# Patient Record
Sex: Female | Born: 1935 | Race: White | Hispanic: No | Smoking: Never smoker
Health system: Southern US, Community
[De-identification: ages and names within clinical notes are randomized; demographics above are authoritative.]

## PROBLEM LIST (undated history)

## (undated) ENCOUNTER — Emergency Department (HOSPITAL_COMMUNITY): Payer: Medicare Other | Source: Home / Self Care

## (undated) DIAGNOSIS — N390 Urinary tract infection, site not specified: Secondary | ICD-10-CM

## (undated) DIAGNOSIS — F0390 Unspecified dementia without behavioral disturbance: Secondary | ICD-10-CM

## (undated) DIAGNOSIS — N200 Calculus of kidney: Secondary | ICD-10-CM

## (undated) DIAGNOSIS — I10 Essential (primary) hypertension: Secondary | ICD-10-CM

## (undated) DIAGNOSIS — C679 Malignant neoplasm of bladder, unspecified: Secondary | ICD-10-CM

## (undated) HISTORY — PX: ABDOMINAL HYSTERECTOMY: SHX81

## (undated) HISTORY — PX: CHOLECYSTECTOMY: SHX55

## (undated) HISTORY — PX: APPENDECTOMY: SHX54

## (undated) HISTORY — PX: TOTAL SHOULDER ARTHROPLASTY: SHX126

## (undated) HISTORY — PX: OTHER SURGICAL HISTORY: SHX169

## (undated) HISTORY — PX: REVISION UROSTOMY CUTANEOUS: SUR1282

---

## 2006-07-28 ENCOUNTER — Encounter: Admission: RE | Admit: 2006-07-28 | Discharge: 2006-07-28 | Payer: Self-pay | Admitting: Urology

## 2006-07-29 ENCOUNTER — Ambulatory Visit (HOSPITAL_BASED_OUTPATIENT_CLINIC_OR_DEPARTMENT_OTHER): Admission: RE | Admit: 2006-07-29 | Discharge: 2006-07-29 | Payer: Self-pay | Admitting: Urology

## 2006-07-29 ENCOUNTER — Encounter (INDEPENDENT_AMBULATORY_CARE_PROVIDER_SITE_OTHER): Payer: Self-pay | Admitting: Specialist

## 2006-08-09 ENCOUNTER — Ambulatory Visit (HOSPITAL_COMMUNITY): Admission: RE | Admit: 2006-08-09 | Discharge: 2006-08-09 | Payer: Self-pay | Admitting: Urology

## 2006-08-15 ENCOUNTER — Inpatient Hospital Stay (HOSPITAL_COMMUNITY): Admission: RE | Admit: 2006-08-15 | Discharge: 2006-08-23 | Payer: Self-pay | Admitting: Urology

## 2006-08-15 ENCOUNTER — Encounter (INDEPENDENT_AMBULATORY_CARE_PROVIDER_SITE_OTHER): Payer: Self-pay | Admitting: *Deleted

## 2006-09-10 ENCOUNTER — Emergency Department (HOSPITAL_COMMUNITY): Admission: EM | Admit: 2006-09-10 | Discharge: 2006-09-11 | Payer: Self-pay | Admitting: Emergency Medicine

## 2007-04-17 ENCOUNTER — Inpatient Hospital Stay (HOSPITAL_COMMUNITY): Admission: EM | Admit: 2007-04-17 | Discharge: 2007-04-21 | Payer: Self-pay | Admitting: Urology

## 2007-07-20 ENCOUNTER — Ambulatory Visit (HOSPITAL_COMMUNITY): Admission: RE | Admit: 2007-07-20 | Discharge: 2007-07-20 | Payer: Self-pay | Admitting: Urology

## 2007-07-21 ENCOUNTER — Ambulatory Visit (HOSPITAL_COMMUNITY): Admission: RE | Admit: 2007-07-21 | Discharge: 2007-07-21 | Payer: Self-pay | Admitting: Urology

## 2007-08-07 ENCOUNTER — Encounter (INDEPENDENT_AMBULATORY_CARE_PROVIDER_SITE_OTHER): Payer: Self-pay | Admitting: Interventional Radiology

## 2007-08-07 ENCOUNTER — Ambulatory Visit (HOSPITAL_COMMUNITY): Admission: RE | Admit: 2007-08-07 | Discharge: 2007-08-07 | Payer: Self-pay | Admitting: Orthopedic Surgery

## 2007-08-23 ENCOUNTER — Inpatient Hospital Stay (HOSPITAL_COMMUNITY): Admission: RE | Admit: 2007-08-23 | Discharge: 2007-08-25 | Payer: Self-pay | Admitting: Orthopedic Surgery

## 2007-08-23 ENCOUNTER — Encounter (INDEPENDENT_AMBULATORY_CARE_PROVIDER_SITE_OTHER): Payer: Self-pay | Admitting: Orthopedic Surgery

## 2007-09-02 ENCOUNTER — Inpatient Hospital Stay (HOSPITAL_COMMUNITY): Admission: EM | Admit: 2007-09-02 | Discharge: 2007-09-07 | Payer: Self-pay | Admitting: Emergency Medicine

## 2007-09-02 ENCOUNTER — Ambulatory Visit: Payer: Self-pay | Admitting: Internal Medicine

## 2007-09-06 ENCOUNTER — Encounter: Payer: Self-pay | Admitting: Internal Medicine

## 2007-09-08 ENCOUNTER — Ambulatory Visit: Payer: Self-pay | Admitting: Internal Medicine

## 2007-09-13 LAB — CBC WITH DIFFERENTIAL/PLATELET
BASO%: 0.4 % (ref 0.0–2.0)
Basophils Absolute: 0 10*3/uL (ref 0.0–0.1)
EOS%: 0.5 % (ref 0.0–7.0)
HGB: 10.6 g/dL — ABNORMAL LOW (ref 11.6–15.9)
MCH: 31.5 pg (ref 26.0–34.0)
MCHC: 34 g/dL (ref 32.0–36.0)
RBC: 3.37 10*6/uL — ABNORMAL LOW (ref 3.70–5.32)
RDW: 12.9 % (ref 11.3–14.5)
lymph#: 1.4 10*3/uL (ref 0.9–3.3)

## 2007-09-13 LAB — LACTATE DEHYDROGENASE: LDH: 109 U/L (ref 94–250)

## 2007-10-23 ENCOUNTER — Ambulatory Visit: Payer: Self-pay | Admitting: Internal Medicine

## 2007-10-25 LAB — CBC WITH DIFFERENTIAL/PLATELET
BASO%: 0.5 % (ref 0.0–2.0)
EOS%: 1.6 % (ref 0.0–7.0)
HCT: 34.6 % — ABNORMAL LOW (ref 34.8–46.6)
MCH: 31.1 pg (ref 26.0–34.0)
MCHC: 33.9 g/dL (ref 32.0–36.0)
MCV: 91.7 fL (ref 81.0–101.0)
MONO%: 11.6 % (ref 0.0–13.0)
NEUT%: 32.4 % — ABNORMAL LOW (ref 39.6–76.8)
lymph#: 2 10*3/uL (ref 0.9–3.3)

## 2007-12-04 ENCOUNTER — Ambulatory Visit (HOSPITAL_COMMUNITY): Admission: RE | Admit: 2007-12-04 | Discharge: 2007-12-04 | Payer: Self-pay | Admitting: Gastroenterology

## 2007-12-07 ENCOUNTER — Ambulatory Visit (HOSPITAL_COMMUNITY): Admission: RE | Admit: 2007-12-07 | Discharge: 2007-12-07 | Payer: Self-pay | Admitting: Gastroenterology

## 2010-02-27 ENCOUNTER — Emergency Department (HOSPITAL_COMMUNITY): Admission: EM | Admit: 2010-02-27 | Discharge: 2010-02-27 | Payer: Self-pay | Admitting: Emergency Medicine

## 2010-07-30 LAB — CBC
HCT: 35.6 % — ABNORMAL LOW (ref 36.0–46.0)
Hemoglobin: 12.3 g/dL (ref 12.0–15.0)
MCH: 32.2 pg (ref 26.0–34.0)
MCHC: 34.4 g/dL (ref 30.0–36.0)

## 2010-07-30 LAB — DIFFERENTIAL
Basophils Relative: 0 % (ref 0–1)
Eosinophils Absolute: 0 10*3/uL (ref 0.0–0.7)
Monocytes Absolute: 0.3 10*3/uL (ref 0.1–1.0)
Monocytes Relative: 6 % (ref 3–12)
Neutrophils Relative %: 78 % — ABNORMAL HIGH (ref 43–77)

## 2010-07-30 LAB — BASIC METABOLIC PANEL
BUN: 13 mg/dL (ref 6–23)
CO2: 25 mEq/L (ref 19–32)
Calcium: 9.5 mg/dL (ref 8.4–10.5)
Glucose, Bld: 101 mg/dL — ABNORMAL HIGH (ref 70–99)
Sodium: 136 mEq/L (ref 135–145)

## 2010-07-30 LAB — TROPONIN I: Troponin I: 0.03 ng/mL (ref 0.00–0.06)

## 2010-09-29 NOTE — Discharge Summary (Signed)
[name], [name]                ACCOUNT NO.:  [id number]   MEDICAL RECORD NO.:  [id number]          PATIENT TYPE:  INP   LOCATION:                               FACILITY:  [facility]   PHYSICIAN:  Georges Lynch. Gioffre, M.D.DATE OF BIRTH:  1927-05-20   DATE OF ADMISSION:  08/23/2007  DATE OF DISCHARGE:  08/25/2007                               DISCHARGE SUMMARY   ADMISSION DIAGNOSES:  1. Right shoulder pain with avascular necrosis of the humeral head by      biopsy and MRI.  2. Hypertension.  3. Cataracts.  4. Recent history of bladder cancer with bladder bag with preoperative      urinary tract infection placed on Cipro.   DISCHARGE DIAGNOSES:  1. Right shoulder hemiarthroplasty with avascular necrosis of the      humeral head sent off to pathology for evaluation.  2. Preoperative urinary tract infection be treated with Cipro.  3. History of hypertension.  4. History of cataracts.   HISTORY OF PRESENT ILLNESS:  The patient is a 77 year old female with  severe right shoulder pain.  She has had it evaluated with MRI, bone  scan, and a biopsy.  The patient has had injury.  MRI showed she had a  fracture through the surgical neck with AVN of the head.  The patient  was very concerned.  Recent history of bladder cancer so a biopsy was  done and was found to be findings consistent with AVN.  No malignancy  was noted.  The patient continues to have pain and has elected to  proceed with a hemiarthroplasty.   ALLERGIES:  CODEINE.   MEDICATIONS ON ADMISSION:  1. Norvasc 10 mg a day.  2. Fosamax 70 mg a week.  3. Calcium once a day.  4. Dilaudid 2 mg p.r.n.  5. Cipro 500 mg twice a day.   SURGICAL PROCEDURES:  On August 23, 2007, the patient was taken to the OR  by Dr. Ranee Gosselin assisted by Oneida Alar, PA-C.  Under general  anesthesia, the patient underwent a right shoulder hemiarthroplasty  without any complications.  The humeral head was sent to pathology for  evaluation.  Estimated  blood loss was 150 mL.  The patient was  transferred to the Recovery Room and then to the orthopedic floor in  good condition on IV pain medicines and antibiotics.   CONSULTS:  The following routine consults requested:  Physical therapy,  occupational therapy, pharmacy.   HOSPITAL COURSE:  On August 23, 2007, the patient was admitted to [facility] under the care of Dr. Ranee Gosselin.  The patient was  taken to the OR where a right shoulder hemiarthroplasty was performed  without any complications.  The patient tolerated the procedure and was  transferred to Recovery Room  and then to orthopedic floor in good  condition.  Later that evening did get a call from the floor nurse aid.  The patient was having a significant amount of bloody drainage from the  wound, and the patient was evaluated bedside by Dr. Darrelyn Hillock.  She was  just  found to have some slow bone bleeding oozing through the wound, and  this was fixed with ice packs and redressing and some pressure.  The  patient had no other untoward events.  The patient the following day was  doing very well.  Her right upper extremity was neurologically intact.  Her wound was clean and dry.  No fresh bleeding.  The patient was able  to transition off the IV medicines to p.o. meds well.  She was continued  in the hospital for one more day.  The following day, the patient was  very comfortable.  Her vital signs were stable.  Her right arm wound was  clean and dry.  Her right upper extremity was neurologically intact.  Blood pressure was on the low side, and she stated this happens every  time she has her surgery.  So, she was instructed to hold her blood  pressure meds until she follows up with her primary care physician.  The  patient was discharged home in good condition.  She was going to  continue on the Cipro for her urinary tract infection for a couple more  days and followup with her primary care physician for that.   LABS:   CBC on admission found WBCs 2.6 with recheck on the 9th at 4.2.  Her hemoglobin was 12.2, hematocrit 35.8, platelets 206,000.  Routine  chemistries on admission within normal limits.  Her estimated GFR was  50.  She had positive nitrates, moderate leukocyte esterase, many  bacteria with over 100,000 colonies noted on culture.  She was treated  with Cipro.   DISCHARGE INSTRUCTIONS:  1. Diet no restrictions.  2. Activity:  The patient is to keep her right upper extremity in the      sling at all times.  3. Wound care:  She is keep her wound clean and dry.  She is to change      her dressing daily.   FOLLOWUP:  The patient needs a followup appointment with Dr. Darrelyn Hillock in  1 week.  The patient is to call [phone] for that followup appointment.   MEDICATIONS:  1. Dilaudid 2 mg 1 or 2 tablets q.4-6 h. for pain if needed.  2. Robaxin 500 mg 1 tablet q.6 h. for pain if needed.  3. Cipro 500 mg 1 tablet twice a day for the next 3 days.  4. Norvasc 10 mg once a day to be placed on hold until sees primary      care physician.  5. Fosamax 70 mg once every week.  May continue that.  6. Calcium.   The patient's condition upon discharge to home is listed improved and  good.      Jamelle Rushing, P.A.    ______________________________  Georges Lynch Darrelyn Hillock, M.D.    RWK/MEDQ  D:  09/06/2007  T:  09/06/2007  Job:  [id number]   cc:   Windy Fast A. Darrelyn Hillock, M.D.  Fax: [phone]

## 2010-09-29 NOTE — Discharge Summary (Signed)
[name], [name]                ACCOUNT NO.:  [id number]   MEDICAL RECORD NO.:  [id number]          PATIENT TYPE:  INP   LOCATION:  1301                         FACILITY:  [facility]   PHYSICIAN:  Herbie Saxon, MDDATE OF BIRTH:  1928/04/15   DATE OF ADMISSION:  09/02/2007  DATE OF DISCHARGE:                               DISCHARGE SUMMARY   ADDENDUM:   ADD TO DISCHARGE DIAGNOSES:  1. Anemia.  2. Mild leukopenia.   DISCHARGE CONDITION:  Stable.   PROCEDURES:  The patient had a bone marrow biopsy and aspirate procedure  done on September 06, 2007.  Biopsy Pending.   HOSPITAL COURSE:  The patient's diarrhea has resolved.  Anemia has been  stable.  The UTI has improved with IV vancomycin.   DIET:  Will be heart healthy, low cholesterol.   ACTIVITY:  To be increased as tolerated.   FOLLOWUP:  1. With her primary care physician, Dr. Everlene Other, in 3-5 days.      __________  to arrange possible GI evaluation as outpatient for      colonoscopy.  Hemoccult x2 has been negative so far.  2. With Dr. Darrelyn Hillock, orthopedics, in 1 week.  3. With Dr. Brunilda Payor, urologist, in 3-4 weeks.  4. Dr. Arbutus Ped, hematology, in 4-6 weeks.  Bone marrow report to be      followed up as outpatient.   MEDICATIONS ON DISCHARGE:  1. Cipro 500 mg b.i.d. for 3 more days.  2. Hemocyte Plus 1 daily.  3. Amlodipine 5 mg daily.  4. Calcium plus vitamin D 500 mg twice daily.  5. Dilaudid 2 mg q.6 h. p.r.n.  6. Robaxin 500 mg q.8 h.  7. Fosamax 70 mg weekly.   PHYSICAL EXAMINATION TODAY:  GENERAL:  She is an elderly female in no  acute distress.  VITAL SIGNS:  Temperature is 98, pulse is 70, respiratory rate is 18,  blood pressure 145/60.  HEENT:  Clinically pale.  Not jaundiced.  Mucous membranes are moist.  Head is atraumatic, normocephalic.  No submandibular lymphadenopathy.  NECK:  Supple.  No carotid bruits or thyromegaly.  No elevated JVD.  UPPER EXTREMITIES:  Right upper extremity is immobilized in a  sling.  CHEST:  Clinically clear.  HEART:  Sounds 1 and 2 regular.  ABDOMEN:  Benign.  NEUROLOGIC:  She is alert, oriented to time, place, and person.  LOWER EXTREMITIES:  Peripheral pulses present.  No pedal edema.   The available labs show that the sodium is 139, potassium 3.6, chloride  111, bicarbonate 23, glucose 90, BUN 2, creatinine 0.7.  WBCs 3.4,  hematocrit 31, platelet count is 241.      Herbie Saxon, MD  Electronically Signed     MIO/MEDQ  D:  09/07/2007  T:  09/07/2007  Job:  [id number]   cc:   Lindaann Slough, M.D.  Fax: [phone]   Georges Lynch. Darrelyn Hillock, M.D.  Fax: [phone]   Lajuana Matte, MD  Fax: [phone]   Tracey Harries, M.D.  Fax: [phone]

## 2010-09-29 NOTE — Op Note (Signed)
[name], [name]                ACCOUNT NO.:  [id number]   MEDICAL RECORD NO.:  [id number]          PATIENT TYPE:  INP   LOCATION:  0002                         FACILITY:  [facility]   PHYSICIAN:  Georges Lynch. Gioffre, M.D.DATE OF BIRTH:  April 27, 1928   DATE OF PROCEDURE:  08/23/2007  DATE OF DISCHARGE:                               OPERATIVE REPORT   SURGEON:  Georges Lynch. Darrelyn Hillock, M.D.   ASSISTANT:  Jamelle Rushing, P.A.   PREOPERATIVE DIAGNOSIS:  Avascular necrosis of the right humeral head.   POSTOPERATIVE DIAGNOSIS:  Avascular necrosis of the right humeral head.   OPERATION:  1. Hemiarthroplasty of the right shoulder.  2. Removal of multiple loose bodies right shoulder.  3. Excision of the humeral head on the right.   PROCEDURE IN DETAIL:  Under general anesthesia with the patient in the  beach chair position she had 1 gram of IV Ancef preop.  Routine  orthopedic prep and drape of the right shoulder was carried out.  At  this time a deltopectoral incision was made in the usual fashion over  the right shoulder.  Bleeders identified and cauterized.  We preserved  the cephalic vein.  I went down in the deltopectoral interval and  entered down, identified the proximal part of the pectoralis major.  I  incised a portion of that.  Following that great care was taken not to  injure the underlying nerves about the coracoid process region.  The  axillary and musculocutaneous nerves were protected.  I went down and  tagged with #1 Ethibond sutures I tagged the subscapularis and then  incised it.  Following that I incised the capsule and released the  capsule down distally.  Once again great care was taken to protect the  axillary nerve.  Following that we dislocated the humeral head.  We  utilized the appropriate guide and cut the head at the appropriate  angle.  We sent the head down to pathology because of her previous  malignancy history.  There were multiple loose bodies within the  joint.  We removed those first.  We then reamed our canal up to a size 8.  We  did initially try to go up to a 10 but a 10 prosthesis was too tight, so  we rasped up to a size 8 finally and we had an excellent fit.  We did  trim the osteophytes about the humeral head and neck region.  Great care  was also taken to protect the rotator cuff.  Following that we irrigated  the area out.  We marked the proximal humerus for our appropriate  retroversion.  We brought our arm out into about 25 degrees external  rotation and then made sure we had the appropriate retroversion with our  prosthesis.  After we did our rasping we then inserted our permanent  prosthesis.  The size was a size 8 humeral stem noncemented.  Following  that we went through trials and we finally selected a 44 mm head and a  15 mm length, that would be a 44 x 15.  We irrigated  out the glenoid  again, reduced the head and had excellent motion.  We then  reapproximated the subscapularis tendon with multiple sutures with the  arm internally rotated.  We then reapproximated the proximal deltoid.  We stripped a small portion of the clavicle.  That wound then was closed  in layers in the usual fashion.  Sterile dressings were applied.  The  patient was placed in a shoulder immobilizer.           ______________________________  Georges Lynch Darrelyn Hillock, M.D.     RAG/MEDQ  D:  08/23/2007  T:  08/23/2007  Job:  [id number]   cc:   Lindaann Slough, M.D.  Fax: [phone]

## 2010-09-29 NOTE — H&P (Signed)
NAMEALENNA, [name]                ACCOUNT NO.:  [id number]   MEDICAL RECORD NO.:  [id number]          PATIENT TYPE:  INP   LOCATION:                               FACILITY:  [facility]   PHYSICIAN:  Georges Lynch. Gioffre, M.D.DATE OF BIRTH:  1928-01-07   DATE OF ADMISSION:  08/23/2007  DATE OF DISCHARGE:                              HISTORY & PHYSICAL   CHIEF COMPLAINT:  Painful range of motion, right shoulder.   HISTORY OF PRESENT ILLNESS:  Ms. [name] is a 77 year old female here  today with severe painful range of motion of her right shoulder.  The  patient evaluation found that she had some AVN of her humeral head with  a questionable healed fracture through the surgical neck.  The patient  has had a fall in the past.  Initially did not have any pain.  She has  got a history of bladder cancer, and there was concern whether or not  this was related to metastatic disease.  A bone scan was done and  highlighted, and she had an MRI which showed that she had AVN with a  questionable fracture.  She subsequently went through a biopsy which  indicated it was more than likely AVN.  The patient has continued  painful range of motion of this shoulder.  She would like to proceed  with a hemiarthroplasty by Dr. Darrelyn Hillock.   ALLERGIES:  CODEINE.   CURRENT MEDICATIONS:  1. Norvasc 10 mg a day.  2. Fosamax 70 mg once a week on Sundays.  3. Calcium once a day.  4. Dilaudid 2 mg p.r.n. pain.   PAST MEDICAL HISTORY:  1. Cataracts.  2. Hypertension.  3. History of bladder cancer with bladder removal, and she currently      has a bag in place.  4. Osteoporosis.   PAST SURGICAL HISTORY:  1. Appendectomy.  2. Gallbladder removal.  3. Hysterectomy.  4. Bladder removal due to bladder cancer.  (She denies any complications with anesthesia with any of the above-  mentioned surgical procedures.)   PRIMARY CARE PHYSICIAN:  Merilynn Finland, M.D.   UROLOGIST:  Lindaann Slough, M.D.   FAMILY MEDICAL  HISTORY:  Father is deceased from a heart attack.  Mother  is deceased from old age and history of strokes.  Brother has a history  of multiple strokes.  Sister with cervical cancer.   SOCIAL HISTORY:  The patient is widowed.  She has never smoked.  Does  not use any alcohol.  She has got 5 grown children.  She lives with  family.   REVIEW OF SYSTEMS:  Positive for some weight changes.  She does have  some dentures.  She does have occasional shortness of breath with  exertion.  She denies any cardiac issues.  She denies any other  neurologic issues.  She does have occasional problems with constipation.  She does have a bladder bag in place.  Musculoskeletal pain as her right  shoulder.   PHYSICAL EXAMINATION:  VITAL SIGNS:  Height is 5 feet, 1 inch.  Weight  is 106 pounds, blood pressure is  138/60, pulse of 70 and regular,  respirations of 12.  The patient is afebrile.  GENERAL:  This is a healthy-appearing, slender, frail-appearing female,  conscious, alert and appropriate.  Appears to be a good historian.  HEENT:  Head was normocephalic.  Gross hearing is intact.  Pupils equal,  round and reactive.  NECK:  Supple.  No palpable lymphadenopathy.  Good range of motion.  She  had no thyroid region discomfort.  CHEST:  Lung sounds were clear and equal bilaterally.  No wheezes,  rales, rhonchi.  HEART:  Regular rate and rhythm.  No murmurs, rubs or gallops.  ABDOMEN:  Positive bowel sounds, soft.  She did have a right-sided  bladder bag in place.  EXTREMITIES:  Left shoulder - she had full range of motion of the  shoulder, elbow and wrist.  Right shoulder - she was able to elevate it  up about 50 degrees in anterior extension and about 45 degrees in  abduction.  She had full range of motion of her right elbow and wrist.  Lower extremities had good range of motion of both hips, knees and  ankles today.  NEUROLOGIC:  The patient had no gross neurologic defects noted.  BREAST/RECTAL/GU:   Deferred at this time.   IMPRESSION:  1. Right shoulder pain with avascular necrosis (AVN) by biopsy and      MRI.  2. hypertension.  3. Cataracts.  4. History of bladder cancer with a bladder bag.   PLAN:  The patient will undergo all routine labs and tests prior to  having a right shoulder hemiarthroplasty by Dr. Darrelyn Hillock at [facility] on August 23, 2007.  The patient's humeral head will be sent for  biopsy for a second evaluation.      Jamelle Rushing, P.A.    ______________________________  Georges Lynch Darrelyn Hillock, M.D.    RWK/MEDQ  D:  08/21/2007  T:  08/21/2007  Job:  [id number]

## 2010-09-29 NOTE — H&P (Signed)
NAMEMALEEAH, CROSSMAN NO.:  [id number]   MEDICAL RECORD NO.:  [id number]          PATIENT TYPE:  INP   LOCATION:  1301                         FACILITY:  [facility]   PHYSICIAN:  Lucita Ferrara, MD         DATE OF BIRTH:  02-Mar-1928   DATE OF ADMISSION:  09/02/2007  DATE OF DISCHARGE:                              HISTORY & PHYSICAL   The patient is a 77 year old Caucasian female who presents to the [facility] with chief complaint of intractable nausea, vomiting  and diarrhea that has been ongoing now for 4 days.  Apparently, this all  started after the patient was started on antibiotics during and after a  shoulder surgery for avascular necrosis of the right humeral head that  occurred August 23, 2007.  Diarrhea is liquidy foul-smelling but non-  mucousy, non-bloody.  Vomitus is also nonbloody, nonbilious.  The  patient cannot tolerate any p.o.  She has also been very fatigued and  weak, and she can tolorate PO.  She denies any urinary frequency,  urgency or burning.  She denies any fevers or chills.  In addition, she  has also been diagnosed and treated for urinary tract infection by her  urologist, Dr. Brunilda Payor.  Note that she is status post radical cystectomy  for advanced bladder cancer.  Apparently, she was given Levaquin by Dr.  Brunilda Payor, and she was also given paraoperative antibiotics during the  shoulder surgery.  She denies a previous history of C. diff colitis or  any other type of colitis.   PAST MEDICAL HISTORY:  Significant for:  1. Cataracts.  2. Hypertension.  3. History of bladder cancer status post radical bladder cystectomy.      She also has currently a bag in place.  4. Osteoporosis.   SURGICAL HISTORY:  1. Status post appendectomy.  2. Status post cholecystectomy.  3. Status post hysterectomy.  4. Status post bladder CA, bladder removal, Ileal loop with a chronic      bag in place.   REVIEW OF SYSTEMS:  Otherwise, review of systems is  negative.   SOCIAL HISTORY:  She denies drugs, alcohol, or tobacco, lives with  daughter.   TRAVEL HISTORY:  None.   ALLERGIES:  Allergic to codeine which causes nausea.   CURRENT MEDICATIONS:  Include:  1. Fosamax 70 mg weekly.  2. Methocarbamol 500 mg three times a day.  3. Levaquin 500 mg daily.  4. Dilaudid 2 mg as needed.   PHYSICAL EXAMINATION:  VITAL SIGNS:  Blood pressure initially when she  came in 78/57, pulse 70, respirations 20.  After IV hydration, blood  pressure is now 128/57.  Mucous membranes are dry.  NECK:  Supple.  No JVD, no carotid bruits.  PERRLA.  Extraocular muscles  intact.  CARDIOVASCULAR:  S1-S2, regular rate and rhythm.  No murmurs, rubs,  clicks.  ABDOMEN:  Distended to light palpation.  It is nontender.  There is a  bag in place draining clear urine.  EXTREMITIES:  Left shoulder:  Obviously it is splinted, and the area  looks clean, dry and intact.   LABORATORY DATA:  Lipase is 45.  Urinalysis shows few bacteria, positive  nitrites and leukocyte esterase.  Complete metabolic panel:  Sodium 134,  BUN 10, creatinine 0.96, glucose is 106, albumin 3.4, white count 3.2,  hemoglobin 11, hematocrit 32.4, platelets 356.  A KUB shows normal bowel  gas pattern, no acute findings.  No active cardiopulmonary disease.   ASSESSMENT/PLAN:  A 77 year old with:  1. Nausea, vomiting, intractable diarrhea, and after recent antibiotic      use likely this represents Clostridium difficile colitis.  2. Will go ahead and admit the patient to the med search floor, will      empirically start treatment with Levaquin and metronidazole, keep      the patient n.p.o. and advance diet to clear liquid.  Stool studies      for Clostridium difficile colitis and bacterial cultures, advance      diet as tolerated.  The patient may need a CT scan of the abdomen      and pelvis to see any objective evaluation of the colitis.  3. Leukopenia with an absolute neutrophil count of 1.6.   Will go ahead      and watch white blood cells.  This likely secondary to #1,  and      infectious etiology.  Will send off blood cultures.  4. Urinary tract infection per labs.  Again, will go ahead and      continue treatment with Levaquin, will await cultures, will go      ahead and send off blood cultures, IV fluids for now.  We may need      to consult Dr. Brunilda Payor, her urologist, if needed or symptoms progress.  5. Status post right shoulder surgery on August 23, 2007 for avascular      necrosis of the right shoulder.  Will continue pain control with IV      Dilaudid physical therapy/occupational therapy consultation.  We      may need to re-consult Dr. Tinnie Gens as needed or just to let him      know that the patient is in the hospital.  6. Deep venous thrombosis and GI prophylaxis with Lovenox and      Protonix.      Lucita Ferrara, MD  Electronically Signed     RR/MEDQ  D:  09/02/2007  T:  09/03/2007  Job:  [id number]

## 2010-09-29 NOTE — H&P (Signed)
NAMEYUKARI, FLAX                ACCOUNT NO.:  [id number]   MEDICAL RECORD NO.:  [id number]          PATIENT TYPE:  INP   LOCATION:  1438                         FACILITY:  [facility]   PHYSICIAN:  Lindaann Slough, M.D.  DATE OF BIRTH:  1927/07/13   DATE OF ADMISSION:  04/17/2007  DATE OF DISCHARGE:                              HISTORY & PHYSICAL   CHIEF COMPLAINT:  Fever, fatigue, back pain and right hip pain.   HISTORY OF PRESENT ILLNESS:  The patient is a 77 year old female status  post radical cystectomy on August 15, 2006, for invasive TCC of the  bladder.  She was doing well until last week when she started feeling  tired and had a poor appetite.  Last night, she fell out of bed and has  been having difficulty walking since.  That happened between 12 and 6  a.m. this morning.  When the family talked to her this morning, she told  them that she fell, but she does not remember when and how it happened.  She got herself up and went back into bed.  She has been complaining of  back pain and right hip pain.  She appears dehydrated today and feeling  fatigued.  Chest x-ray showed no evidence of active disease.  Right hip  x-ray showed no acute abnormalities except for osteoarthritis.  Her  temperature in the office was 102.7 and then went down to 100.  I  advised her to be admitted for rehydration and IV antibiotics.  Her  urinalysis showed 3+ bacteria, nitrite negative and 15 mg protein.   PAST MEDICAL HISTORY:  1. Hypertension.  2. History of bladder cancer.   PAST SURGICAL HISTORY:  She is status post hysterectomy, appendectomy  and cholecystectomy.   MEDICATIONS:  1. Amlodipine 10 mg.  2. Calcium plus vitamin D.  3. Fosamax 70 mg.   ALLERGIES:  CODEINE.   FAMILY HISTORY:  Positive for heart disease and lung cancer.   SOCIAL HISTORY:  She is a widow.  She does not smoke or drink.   REVIEW OF SYSTEMS:  Positive for fever, fatigue, cough, back pain and  right hip pain.   Everything else is negative.   PHYSICAL EXAMINATION:  GENERAL:  She appears dehydrated but is oriented  to time, place and person.  VITAL SIGNS:  Blood pressure 117/63, pulse 89, respirations 16,  temperature 102.7 in the office.  When she went to the hospital, her  temperature was down to 98.9 with blood pressure 114/58, pulse 82 and  respirations 16.  HEENT:  Her head is normal.  She has pink conjunctivae.  Ears, nose and  throat are within normal limits.  NECK:  Supple.  She has no cervical adenopathy.  CHEST:  Symmetrical.  Lungs are fully expanded and clear to percussion  and auscultation.  HEART:  Regular rhythm.  ABDOMEN:  Soft.  She has an ileal loop that is draining clear urine.  The liver, spleen and kidneys are not palpable.  Bowel sounds normal.   Urinalysis shows 3+ bacteria, 5-15 WBCs, 15 mg protein, pH 6.5, specific  gravity 1010.  Renal ultrasound in the office showed no perinephric  abscess and minimal left hydronephrosis.   IMPRESSION:  Urinary tract infection with urosepsis.   PLAN:  IV fluid.  Check urine culture and sensitivity.  Cipro 400 mg IV  every 12 hours.      Lindaann Slough, M.D.  Electronically Signed     MN/MEDQ  D:  04/17/2007  T:  04/18/2007  Job:  [id number]

## 2010-09-29 NOTE — Discharge Summary (Signed)
[name], [name]                ACCOUNT NO.:  [id number]   MEDICAL RECORD NO.:  [id number]          PATIENT TYPE:  INP   LOCATION:  1438                         FACILITY:  [facility]   PHYSICIAN:  Lindaann Slough, M.D.  DATE OF BIRTH:  Sep 30, 1927   DATE OF ADMISSION:  04/17/2007  DATE OF DISCHARGE:  04/21/2007                               DISCHARGE SUMMARY   DISCHARGE DIAGNOSES:  1. Urosepsis.  2. Pyelonephritis.  3. Hypertension.  4. Status post radical cystectomy and contusion, right buttock.   CONSULTATION:  Georges Lynch. Darrelyn Hillock, M.D.   HISTORY:  The patient is a 77 year old female who had a radical  cystectomy on August 15, 2006 for invasive TCC of the bladder.  She was  doing well until a week ago when she started feeling tired and having a  poor appetite.  On November 31, 2008, she fell out of bed and had been  having difficulty walking since.  She did not remember exactly what  happened.  She fell out of bed.  In the office, her temperature was  102.7.  She was then admitted for IV antibiotics.   PHYSICAL EXAMINATION:  VITAL SIGNS:  Blood pressure was 117/63, pulse  89, respirations 16, temperature 102.7.  LUNGS:  Clear.  HEART:  Regular rhythm.  ABDOMEN:  Soft nondistended, nontender.   HOSPITAL COURSE:  The ileal loop was draining clear urine.  Urine  culture in the office was positive for  E. coli.  She was treated with IV Cipro, however, she continued to spike  temperature and her temperature went up to 103.1.  Then she was started  on gentamicin also.  MRI of the right hip showed a contusion of the  right buttock.  MRI of the brain showed no evidence of tumor.  CT scan  of the abdomen and pelvis showed some stranding in the perinephric area.  She continued to defervesce.  On April 21, 2007, she started eating  better.  The ileal loop continued to drain clear urine.  On April 21, 2007, she was afebrile.  She was eating well.  She did not have any  pain.  She was  then discharged home.   DISCHARGE MEDICATIONS:  1. Levaquin 500 mg daily.  2. Iron tablets 1 daily.  3. Calcium.  4. Fosamax.  5. I have asked her to hold off on taking Norvasc because she was      running a low blood pressure during her hospitalization.   DISCHARGE INSTRUCTIONS:  1. She will call Dr. Everlene Other on Monday to find out if she needs to      resume the medication.  2. She is she is instructed not to do any lifting or straining.  3. She will be seen in the office in 2 weeks for followup.   DISPOSITION:  She was then discharged home on April 21, 2007.   CONDITION ON DISCHARGE:  Improved.   DISCHARGE DIET:  Regular.      Lindaann Slough, M.D.  Electronically Signed     MN/MEDQ  D:  04/21/2007  T:  04/22/2007  Job:  [id number]   cc:   Tracey Harries, M.D.  Fax: [phone]

## 2010-09-29 NOTE — Consult Note (Signed)
Rachel, Schneider                ACCOUNT NO.:  [id number]   MEDICAL RECORD NO.:  [id number]          PATIENT TYPE:  INP   LOCATION:  1301                         FACILITY:  [facility]   PHYSICIAN:  Lajuana Matte, MD  DATE OF BIRTH:  1927-06-13   DATE OF CONSULTATION:  09/04/2007  DATE OF DISCHARGE:                                 CONSULTATION   REASON FOR CONSULTATION:  Neutropenia.   REFERRING PHYSICIAN:  Lindaann Slough, M.D.   HISTORY OF PRESENT ILLNESS:  [name] is a pleasant 77 year old woman  asked to see for evaluation of neutropenia.  The patient had a normal  white blood cell count until April 19, 2007, with her white count was  10.1; her hemoglobin was 9.8, and her hematocrit was 28.2 with a  platelet count of 119.  Since that time the patient was diagnosed with  transitional cell carcinoma of the bladder, not requiring chemotherapy  or radiation, but undergoing a resection of the bladder as well as a  recent right shoulder surgery secondary to avascular necrosis.  As of  August 07, 2007, her white count was 2.8, and on August 24, 2007, this was  4.2, then down again to 3.2 on April 18 with an ANC of 1.6.  Today her  white blood cell count is 2.4 with a neutrophil count of 1.2.  Hemoglobin is 9.1 and hematocrit 27.2 with platelet count normal of 251.  Of note, since the diagnosis of transitional cell carcinoma, the patient  had frequent episodes of UTI treated with several courses of  antibiotics.  In addition, she also has been receiving NSAIDS for  shoulder pain prior to the procedure.  On August 24, 2007, in fact, she  was started on methocarbamol.  Recently she also has been treated with  Levaquin, which continued during her admission this time, since September 02, 2007, with a chief complaint of nausea, vomiting and diarrhea.  Based on the abnormal lab findings, we were asked to see her with  recommendations regarding her care.   PAST MEDICAL HISTORY:  1. History of  invasive transitional cell carcinoma of the bladder      diagnosed in March 2008, not requiring chemotherapy or radiation.  2. History of avascular necrosis of the right shoulder status post      surgery.  3. Recurrent UTIs including during this admission.  4. Hypertension.  5 . Anemia.   PAST SURGICAL HISTORY:  1. Status post bladder resection and bilateral pelvic lymph node      dissection March 2008 with a chronic bag in place.  2. Status post right shoulder surgery,  Dr. Darrelyn Hillock, August 23, 2007.  3. Status post cataract resection.  4 . Status post hysterectomy.  1. Status post appendectomy.  2. Status post cholecystectomy.   ALLERGIES:  CODEINE causes nausea, vomiting, and REGLAN causes  agitation.   MEDICATIONS:  Fosamax, Lovenox, Levaquin, Robaxin, Reglan, Protonix,  Tylenol, Dulcolax, guaifenesin, Dilaudid, Maalox, Zofran, Roxicodone,  Phenergan.   REVIEW OF SYSTEMS:  Remarkable for intermittent low-grade fevers, but no  subjective fevers.  She did have  chills during this admission.  No night  sweats.  She is increasingly fatigued.  No headaches, confusion, double  vision, dysphagia or dyspnea.  No productive cough.  No chest pain,  abdominal pain or GERD symptoms.  She does have increased cramping  during the admission, but this has resolved.  In addition, she had  nausea and vomiting and diarrhea, which are now much improved.  No  constipation or blood in the stools.  No hematuria.  She does have  chronic urgency.  No back pain, numbness, edema or failure to thrive.  No weight loss.  Her right shoulder pain is secondary to avascular  necrosis, has been treated with anti-inflammatory for 7 days in December  2008.  Then, after the bone scan, CT scan, along with biopsy of the  area, the diagnosis was avascular necrosis for which she had shoulder  surgery on August 23, 2007.   FAMILY HISTORY:  Mother died with CVA; she had a history of Alzheimer's.  Father died with MI.  She  has one sister with colon cancer and one  brother with multiple strokes. Lives in [place], [place].  She is  [employer].  Never had a colonoscopy.   PHYSICAL EXAMINATION:  GENERAL:  This is a well-developed, thin, 77-year-  old white female in no acute distress, alert and oriented x3.  VITAL SIGNS:  Blood pressure 137/60, pulse 65, respirations 20,  temperature 99.2, pulse oximetry 100% in room air.  Weight 47.8 kg.  HEENT:  Normocephalic, atraumatic.  PERRLA.  Oral mucosa without thrush  or lesions.  NECK:  Supple.  No cervical or supraclavicular masses.  LUNGS:  Trace crackles at the bases, otherwise clear to auscultation.  No axillary masses.  CARDIOVASCULAR:  Regular rate and rhythm without murmurs, rubs or  gallops.  ABDOMEN:  Soft, nontender.  Bowel sounds x4.  No hepatosplenomegaly.  EXTREMITIES:  Remarkable for right sling.  No clubbing or cyanosis.  No  edema.  There are some arthritic changes in her hands.  No inguinal  masses.  SKIN:  Without petechial rash or open lesions other than the healing  lesion in her right shoulder.  BREASTS:  Not examined.  GU AND RECTAL:  Deferred.  The patient has catheter with clear urine.  She has a urinary bladder in place.  MUSCULOSKELETAL:  Without spinal tenderness.  NEUROLOGIC:  Nonfocal.   LABORATORY DATA:  Hemoglobin 9.1, hematocrit 27.2, white count 2.4,  platelets 251, MCV 94.1, ANC 1.2, monocytes 0.2, lymphocytes 0.9. Sodium  136, potassium 3.6, BUN 7, creatinine 0.9, glucose 99, total bilirubin  0.4, alkaline phosphatase 18, AST 33, ALT 29, total protein 6.9, albumin  2.8, calcium 8.5, magnesium 1.8.  Stool cultures negative to date.  Hemoccult x1 negative   ASSESSMENT AND PLAN:  1. Dr. Shirline Frees has seen and evaluated the patient and reviewed the      chart. This is a 77 year old white female asked to see for      evaluation of leukopenia and neutropenia, most likely secondary to      recent medication, especially NSAIDS,  methocarbamol which are well      known to cause leukopenia, and also Levaquin, but a myelodysplastic      condition cannot be excluded at this time.  Will consider her for      bone marrow biopsy and aspirate in 1-2 days. I recommend to      discontinue Methocarbamol and all NSAIDS at this point.  2. Anemia, stable but  also concerning for myelodysplastic syndrome.      Check the iron studies, ferritin, LDH, SPEP, vitamin B12, and RBC      folate.   Dr. Shirline Frees will continue to follow up with you.  Thank you very much  for allowing [place] the opportunity to participate in the care of this nice  patient.      Marlowe Kays, P.A.      Lajuana Matte, MD  Electronically Signed    SW/MEDQ  D:  09/04/2007  T:  09/04/2007  Job:  [id number]   cc:   Georges Lynch. Darrelyn Hillock, M.D.  Fax: [phone]   Tracey Harries, M.D.  Fax: [phone]

## 2010-09-29 NOTE — Consult Note (Signed)
NAMEBREEANA, [name]                ACCOUNT NO.:  [id number]   MEDICAL RECORD NO.:  [id number]          PATIENT TYPE:  INP   LOCATION:  1438                         FACILITY:  [facility]   PHYSICIAN:  Georges Lynch. Gioffre, M.D.DATE OF BIRTH:  June 04, 1927   DATE OF CONSULTATION:  DATE OF DISCHARGE:                                 CONSULTATION   I got called to see [name] because of pain in her right hip region.  Apparently she had fallen x2 at home.  She is not sure why she fell, but  she had no pain in that area prior to the fall.  She has had a history  of bladder cancer.  Apparently she had, according to the family, a high  temperature before she came in.  The past history is all well documented  in her chart by Dr. Brunilda Payor.  She had surgery of a radical cystectomy in  the past for cancer of the bladder.  The physical examination from an  orthopedic standpoint, she was alert and oriented.  She complained of  pain in the right gluteal area.  She did not describe any pain in the  right groin with motion.  I can take her right hip through a normal  range of motion without any difficulty whatsoever.  She had no pain over  the pubic region on the right.  She did have pain when I rolled her up  on her left side and examined her right gluteal area.  She had pain  directly over the sciatic notch region on the right.  She had no pain  that  radiated down her lower with palpation.  Her muscle testing exam  of the lower extremities was intact.  Sensory exam of the lower  extremities was intact.  Her circulation was intact.  Her calf was soft,  nontender, no signs of any phlebitis.  On the exam of her opposite left  hip, she had normal painless range of motion of her left hip.  Her knees  were normal.  The tibias were normal.  The ankles were normal.  In her  upper extremities there were no signs of any fracture dislocation to the  upper extremities.  She had no back pain.  In regards to her clavicles  they were normal as well and she had no other complaints.  So basically  the only problem was pain on palpation in the right gluteal area, and  there were no masses noted.  I reviewed the x-rays of her pelvis and  hip.  I felt they were negative.  We are going to get an MRI of her  right hip.  Dr. Brunilda Payor is going to get an MRI of her brain and see if we  cannot find out why she had fallen for no real good reason.  I will  follow up on the MRI.           ______________________________  Georges Lynch. Darrelyn Hillock, M.D.     RAG/MEDQ  D:  04/18/2007  T:  04/18/2007  Job:  [id number]   cc:  Lindaann Slough, M.D.  Fax: [phone]

## 2010-09-29 NOTE — Discharge Summary (Signed)
[name], [name]                ACCOUNT NO.:  [id number]   MEDICAL RECORD NO.:  [id number]          PATIENT TYPE:  INP   LOCATION:  1301                         FACILITY:  [facility]   PHYSICIAN:  Altha Harm, MDDATE OF BIRTH:  05-16-1928   DATE OF ADMISSION:  09/02/2007  DATE OF DISCHARGE:                               DISCHARGE SUMMARY   DATE OF DISCHARGE:  Not yet determined.   DISCHARGE DIAGNOSES:  1. Diarrhea, etiology unknown as of yet.  2. Enterococcal urinary tract infection.  3. Chronic anemia, etiology unknown.  4. Neutropenia  5. Mild dehydration, resolved.  6. History of invasive transitional cell carcinoma of the bladder in      March 2008.  7. History of avascular necrosis of the right shoulder status post      surgery.  8. Hypertension.  9. Recurrent urinary tract infections.   DISCHARGE MEDICATIONS:  To be determined at the time of discharge.   CONSULTANT:  Dr. Arbutus Ped, Hematology/Oncology.   PROCEDURE:  None.   DIAGNOSTIC STUDIES:  1. CT of the abdomen and pelvis with contrast on admission which shows      no acute abdominal or pelvic findings.  2. Acute abdominal series which shows a normal bowel gas pattern.  No      acute findings.  No active cardiopulmonary disease.   LABORATORY DATA:  White blood cell count initially 3.3 down to 2.2,  hemoglobin 9.7, stable, hematocrit 30.8, platelet count 261, sodium 139,  potassium 3.4, chloride 114, BUN 4, creatinine 0.82, ferritin low at  235.  Vitamin B12 within normal limits at 322.  Fecal occult blood  positive.  Stool for C. diff negative x2.   CHIEF COMPLAINT:  Intractable nausea, vomiting and diarrhea.   HISTORY OF PRESENT ILLNESS:  Please see the H&P dictated by Dr. Flonnie Overman  for details of the HPI.   HOSPITAL COURSE:  1. The patient on admission was continued on the Levaquin that she had      been taking for several days.  She was also being treated with      Cipro for presumptive C. diff.   However clinically, the patient did      not appear to have C. diff.  She had no abdominal symptoms of      finding and furthermore she had no escalation in her white blood      cell count.  Thus the Flagyl was discontinued.  The patient had an      urinary culture performed by Dr. Ashley Royalty' office last week.  The      culture was reviewed which showed enterococcal UTI sensitive only      to nitrofurantoin and vancomycin.  In light of the fact that      nitrofurantoin can cause cytopenia, the decision was made for the      choice of vancomycin.  The patient thus was started on vancomycin      and is day number 2 of vancomycin day.  2. Leukopenia.  The patient has leukopenia which is new since January.  It appears that the patient had a white blood cell count that was      normal at 10 up until several months ago.  She has had a steadily      declining white blood cell count in conjunction with a relative      anemia of 9.7.  In light of this, I asked the      hematologist/oncologist to see the patient for further workup      including bone marrow biopsy to determine whether or not this may      be mild dysplastic syndrome or secondary to the medications that      she may have taken.  3. Diarrhea.  The patient still continued to have diarrhea, however,      appears to be noninfectious in nature.  She has been on Reglan      which can contribute to this.  I am going to stop the Reglan today      and observe her for any further diarrhea.  Her C. diff has been      negative x2.  As  a matter of fact, the patient has not continued      on any anti-C. diff medication at this time as I have very strong      doubt that this patient does have C. diff.  If the patient's next      C. diff is negative, I would start Imodium or Lomotil on the      patient to curtail her diarrhea.  Otherwise, the patient is stable.      All of the other medical problems are stable.  4. In terms of her anemia, the  patient does have fecal occult blood      positive and will probably require a colonoscopy.  However, I think      it is prudent to wait until the patient has completed her bone      marrow biopsy before considering colonoscopy.  The patient      certainly been stable with this hemoglobin and the colonoscopy will      be done as an outpatient depending on her circumstances.   DIETARY RESTRICTIONS:  The patient is on a 2 gm sodium diet which she is  tolerating well without any further emesis.   PHYSICAL RESTRICTIONS:  None.      Altha Harm, MD  Electronically Signed     MAM/MEDQ  D:  09/05/2007  T:  09/05/2007  Job:  [id number]   cc:   Lindaann Slough, M.D.  Fax: [phone]   Lajuana Matte, MD  Fax: [phone]

## 2010-10-02 NOTE — Op Note (Signed)
[name], [name]                ACCOUNT NO.:  [id number]   MEDICAL RECORD NO.:  [id number]          PATIENT TYPE:  INP   LOCATION:  1225                         FACILITY:  [facility]   PHYSICIAN:  Lindaann Slough, M.D.  DATE OF BIRTH:  1928-04-29   DATE OF PROCEDURE:  08/15/2006  DATE OF DISCHARGE:                               OPERATIVE REPORT   PREOPERATIVE DIAGNOSIS:  Bladder cancer.   POSTOPERATIVE DIAGNOSIS:  Bladder cancer.   PROCEDURES PERFORMED:  1. Extensive lysis of adhesions.  2. Cystectomy.  3. Bilateral pelvic lymph node dissection.  4. Ileal conduit diversion.   SURGEON:  Danae Chen, MD   ASSISTANT:  Cornelious Bryant, MD   ANESTHESIA:  General.   SPECIMEN:  1. Bladder.  2. Bilateral distal ureteral margins for frozen section and permanent      section.  3. Bilateral pelvic lymph nodes.   DISPOSITION OF PATHOLOGY:  Specimen for frozen and permanent.   ESTIMATED BLOOD LOSS:  3400 mL.   COMPLICATIONS:  None.   INDICATIONS:  This is a 77 year old lady who presented to Dr. Brunilda Payor  attention for recurrent urinary tract infections and gross hematuria.  CT scan showed left renal cyst and a filling defect in the bladder.  Cystoscopy showed a large bladder tumor that measured more than 6 cm.  The patient had a TURBT done.  Pathology revealed invasive cancer.  After extensive counseling the patient elected for radical cystectomy  with the ileal conduit diversion.  Extensive counseling regarding other  alternatives including the chemoradiation were discussed with the  benefits and side effects.  The patient elected to proceed with the  surgical intervention.   DESCRIPTION OF PROCEDURE:  The patient was brought to the operating  room.  The patient has had a bowel prep the day before.  Preoperative  antibiotics were given.  General anesthesia was induced.  The patient  was placed in the low lithotomy position.  Time-out was taken to  properly identify the patient and  procedure going to be done.  Bilateral  SCDs were applied throughout the case to prevent DVTs and the pressure  points were padded adequately to prevent neuropathy.  The patient's  abdominal area, perineal area, vaginal area and upper thighs in addition  to her lower chest were prepped and draped in the normal sterile  fashion.  A 30 mL balloon Foley catheter was then inserted into the  bladder in the sterile field and clamped.  The stomal site on the skin  was marked with a needle for stomal placement later during the case.  A  midabdominal incision was made from a midpoint between the xiphoid  process and the umbilicus down to the symphysis pubis.  Dissection was  carried down through the abdominal layers onto the anterior rectus  sheath.  The anterior rectus sheath was then incised in the middle  carefully using Bovie catheter.  Previous nonabsorbable suture material  was noted from her previous abdominal hysterectomy.  The suture material  was subsequently taken out.  Once the anterior rectus sheath was  incised, the rest of the  dissection was carried sharply using Metzenbaum  scissors due to extensive adhesions that were noted to be present  throughout her abdominal cavity.  The omentum was adherently stuck to  the abdominal wall and more than 60 minutes of lysis of adhesion was  done, thus mobilizing the omentum off the abdominal wall and lysing the  various adhesions between the small bowels and the abdominal cavity.  Of  note, there was a loop of small bowel that was extending deep into the  pelvis and posterior to the bladder where the peritoneal reflects over  the rectum.  This was sharply incised and mobilized cephalad.  After  lysis of adhesions, the bowel was then packed cephalad.  During the  process of adhesiolysis, the bladder was filled with about 200 mL of  saline in order to better demarcate the bladder and the reflection of  the peritoneum over the bladder.  Following  the removal of adhesions,  the bladder was then deflated and the catheter was clamped again.  The  bowels were then packed cephalad and the peritoneal incision was then  extended posteriorly bilaterally.  Initially the right posterior  peritoneum was incised and using a Kitner, the ureter was identified and  a vessel loop was applied around it.  The ureter was then freed down to  the level of the bladder using a combination of sharp and blunt  dissection.  The ureter was then clipped and cut.  The ureter was then  freed proximally, thus mobilizing the ureter to a great extent.  During  the dissection extreme care was taken not to skeletonize to the ureter  and leave healthy periureteral adventitial tissue around it to preserve  the blood supply.  The distal margin of the right ureter was sent for  frozen section and the rest of the clipped ureter was tagged with a  chromic.  The same procedure was done on the left side with  identification of the left ureter, isolating it with a vessel loop and  dissecting it distally to the level of the bladder, where it was clipped  and cut.  The left ureter was then dissected proximally using a  combination of sharp and blunt dissection until the left ureter was  freely mobile and could be mobilized up to the level of the skin with no  tension.  The margin of the left distal ureter was also sent for frozen  section.  At this point both frozen sections from the left distal ureter  and the right distal ureter came back as negative for cancer.  Following  the isolation of the ureters, it was decided to proceed with the removal  of the bladder.  A Deaver was inserted into the vagina, thus tenting the  apex of the vaginal cuff.  A Bovie catheter was then used and the apex  of the vaginal cuff was incised along the Deaver.  The anterior wall of  the vaginal vault together with the bladder pedicles was then taken down systematically using the LigaSure.   Dissection was carried down distally  up to the level of the urethral orifice.  At this point and through the  vaginal vault, an inverted U incision was made encompassing the urethra.  Dissection around through the periurethral tissue was then done  mobilizing the anterior aspect of the urethra.  The LigaSure then took  down the anterior vaginal mucosa that remained between the inverted U  and the anterior vaginal wall that has already  been dissected by the  LigaSure thus far.  At this point the entire bladder with the anterior  vaginal wall was freed from the patient and the entire specimen was  taken to pathological examination.  Some bleeding was noted at the level  of the dorsal venous complex.  This was oversewn using 2-0 Vicryl using  figure-of-eight.  The posterior vaginal wall was then folded anteriorly  and the vaginal apex was then sutured to the anterior remnant of the  vaginal wall.  The lateral walls were also sutured together, thus  reconstituting a blind pouch which will be the neo-vagina with the  patient.  Hemostasis throughout the case was adequately obtained using  combination of Bovie coagulation, and suture ligature and ligation.  The  ileocecal junction was then identified and about 15 cm from that  junction, the ileum was marked.  About a 10-15 cm segment was then  chosen from that mark proximally.  This ileal segment was isolated using  staples and LigaSure.  The bowel was then reconstituted using a  combination of GIA staples and TA staples.  The mesenteric defect was  then closed using 3-0 silk in an interrupted fashion.  The staple line  was then re-imbricated using a 3-0 silk in an interrupted fashion.  During the establishment of the bowel continuity, it was made sure that  the mesentery of the conduit was posterior to the mesentery of the  bowels.  Following the isolation of the ileal conduit the anastomosis of  the ureters to the ileal conduit was then  undertaken, making sure that  the conduit was in isoperistaltic direction.  The left ureter was  initially tunneled behind the mesentery of the sigmoid colon to the  right side.  The left ureter was then anastomosed initially to the ileal  conduit.  This was done in the following fashion:  Silk 2-0 sutures were  then applied to the proximal aspect of the ileal conduit as stay  sutures.  An enterotomy incision was then made into the ileal conduit.  The clipped end of the ureter was then cut with Potts scissors and  spatulated.  A single-J stent was then inserted into the ureter to the  level of the kidney and the metallic wire was taken out, thus engaging  the stent in place.  The distal end of the stent was then inserted  through the enterotomy incision into the ileal conduit.  Two 4-0 Vicryl  sutures, one at the vertex of the spatulated part of the ureter and one  directly opposite to that, were applied to the ureter going from the adventitial side to the mucosal side.  These sutures were then applied  to the corresponding position on the enterotomy site on the ileal  conduit going from the mucosal side to the serosal side of bowel.  During these anastomoses, mucosal to mucosal attachment was ensured with  good serosal and adventitial support.  The sutures were then tied down.  The rest of the anastomosis was then done using 4-0 Vicryl in an  interrupted fashion in a watertight fashion.  The same procedure was  done to anastomose the right ureter to the right ileal conduit.  The  abdominal cavity was thoroughly irrigated with fluid and aspirated.  Good hemostasis was observed and Gelfoam was applied into the pelvic  area.  The JP stent was then inserted through the left lower quadrant  and inserted into the pelvic cavity.  The stomal site on the  skin was  then cut in a circular fashion and a plug of fat was then taken out.  A  cruciate incision was then made through the anterior rectus  fascia and  using tonsils, the fascia was pierced.  The fascial defect was then  dilated using surgeon's finger so that the fascial defect can  accommodate two fingers easily with no pressure.  Tanja Port was then used  to deliver the stomal aspect of the ileal conduit to the abdominal  cavity.  The body of the ileal conduit was anchored to the anterior  rectus fascia using 3-0 Vicryl at three points that did not include the  mesentery.  The anterior rectus sheath was then closed using double-  stranded 0 PDS in a running fashion.  Novofil sutures were then applied  in an interrupted figure-of-eight fashion to reinforce the fascial  closure.  During closure, extreme care was taken not to injure any bowel  or the ileal conduit.  The subcutaneous tissue was then thoroughly  irrigated.  The stomal site of the ileal conduit was then cut, thus  making it possible to mature the stoma to the skin by an Bricker's  technique using 4-0 Vicryl in an interrupted fashion.  The skin was then  closed using staples.  At this point the procedure terminated with no  complications.  A Betadine-soaked vaginal pack was inserted.  The  patient was extubated in the operating room and taken to PACU in stable  condition.  Please note that Dr. Brunilda Payor was present and participated in  the entire procedure as he was the responsible surgeon.     ______________________________  Terie Purser, MD      Lindaann Slough, M.D.  Electronically Signed    JH/MEDQ  D:  08/15/2006  T:  08/16/2006  Job:  [id number]

## 2010-10-02 NOTE — Op Note (Signed)
[name], [name]                ACCOUNT NO.:  [id number]   MEDICAL RECORD NO.:  [id number]          PATIENT TYPE:  AMB   LOCATION:  NESC                         FACILITY:  [facility]   PHYSICIAN:  Lindaann Slough, M.D.  DATE OF BIRTH:  1927/10/03   DATE OF PROCEDURE:  07/29/2006  DATE OF DISCHARGE:                               OPERATIVE REPORT   PREOPERATIVE DIAGNOSIS:  Bladder tumor.   POSTOPERATIVE DIAGNOSIS:  Bladder tumor.   PROCEDURES PERFORMED:  Cystoscopy, transurethral resection of bladder  tumor.   SURGEON:  Danae Chen, M.D.   ANESTHESIA:  General.   INDICATIONS:  The patient is a 76 year old female who was seen in the  office on July 20, 2006, for recurrent urinary tract infections and  gross hematuria.  CT scan showed a left renal cyst and a filling defect  in the bladder.  Cystoscopy showed a large bladder tumor that measured  more than 6 cm.  She is scheduled today for TUR of the bladder tumor.   DESCRIPTION OF PROCEDURE:  Under general anesthesia, the patient was  prepped and draped and placed in the dorsal lithotomy position.  A #22  panendoscope was inserted in the bladder.  There is a large papillary  tumor on the right lateral wall of the bladder.  The tumor is very large  and I cannot see the base of the tumor. It extends from the base to the  anterior wall of the bladder.  I was not able to visualize the ureteral  orifices.  The cystoscope was removed.  A resectoscope was then inserted  in the bladder.  Resection of the bladder tumor was then done.  After  complete resection, the base of the bladder tumor was above the right  ureteral orifice.  The resected tumor was then irrigated out of the  bladder.  Then, the base of the bladder tumor was resected and sent in  separate containers for staging purposes.  Then, the resectoscope was  removed.  A random bladder biopsy was done.  The cystoscope was then  removed.  The resectoscope was then reinserted in the  bladder and the  base of the bladder tumor was fulgurated as well as a 2 cm resection  margin while preserving the ureteral orifice.  There was no evidence of  remaining tumor in the bladder at the end of the procedure.  There was  no evidence of bleeding.  The resectoscope was then removed.  A 18-  French Foley catheter was then inserted in the bladder.  The patient  tolerated the procedure well and left the OR in satisfactory condition  to post anesthesia care unit.      Lindaann Slough, M.D.  Electronically Signed    MN/MEDQ  D:  07/29/2006  T:  07/30/2006  Job:  [id number]   cc:   Tracey Harries, M.D.  Fax: [phone]

## 2010-10-02 NOTE — Discharge Summary (Signed)
[name], [name]                ACCOUNT NO.:  [id number]   MEDICAL RECORD NO.:  [id number]          PATIENT TYPE:  INP   LOCATION:  1423                         FACILITY:  [facility]   PHYSICIAN:  Lindaann Slough, M.D.  DATE OF BIRTH:  March 07, 1928   DATE OF ADMISSION:  08/15/2006  DATE OF DISCHARGE:  08/23/2006                               DISCHARGE SUMMARY   DISCHARGE DIAGNOSES:  1. 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  2. Hypertension.   PROCEDURE PERFORMED:  Radical cystectomy with ileal loop, done on August 15, 2006.   The patient is a 77 year old female who was seen in consultation from  Dr. Everlene Other, for recurrent urinary tract infection and hematuria.  CT  scan showed no kidneys tumor.  Cystoscopy showed a large papillary tumor  in the bladder.  TUR of bladder tumor showed a high-grade transitional  cell carcinoma of the bladder with smooth muscle invasion. The patient  was admitted on March 31 for radical cystectomy.   PHYSICAL EXAMINATION:  VITAL SIGNS:  Blood pressure was 150/70, pulse  76, respirations 16, temperature 97.5.  LUNGS:  Clear.  HEART:  Regular rhythm.  ABDOMEN:  Soft, nondistended, nontender.  She has well-healed scars of  cholecystectomy, hysterectomy.  Bowel sounds were normal.  GENITALIA:  She had a moderate cystocele.   Hemoglobin on admission was 10.8, hematocrit 32.1, WBC 3.9, sodium 142,  potassium 4.2, BUN 11, creatinine 1.04.  Chest x-ray showed chronic lung  changes, and EKG showed normal sinus rhythm.   The patient had lysis of adhesions, radical cystectomy and ileal conduit  on August 15, 2006.  Post-op course was fairly stable.  She remained  afebrile.  NG tube was removed on third day post-op, and then she  started passing gas, and she was started on clear diet which she  tolerated well.  The diet was gradually advanced, and she had regular  bowel movements.  She remained afebrile, and on August 23, 2006, she was  discharged  home on Vicodin, Norvasc 10 mg daily, calcium 600 plus  vitamin D twice daily, Fosamax 75 mg once a week and ferrous sulfate one  h.s.   CONDITION ON DISCHARGE:  Improved.   DISCHARGE DIET:  Regular.   The patient is instructed not to do any lifting, straining or driving  until further advised.   Skin staples were removed.  Steri-Strips were applied to the wound.   Pathology report showed no residual carcinoma in the bladder, and lymph  nodes were negative.   The patient will be followed in the office in about two weeks.      Lindaann Slough, M.D.  Electronically Signed     MN/MEDQ  D:  08/23/2006  T:  08/23/2006  Job:  [id number]   cc:   Tracey Harries, M.D.  Fax: [phone]

## 2010-10-02 NOTE — H&P (Signed)
[name], SCHOENFELDER                ACCOUNT NO.:  [id number]   MEDICAL RECORD NO.:  [id number]          PATIENT TYPE:  INP   LOCATION:  0003                         FACILITY:  [facility]   PHYSICIAN:  Lindaann Slough, M.D.  DATE OF BIRTH:  1928-02-26   DATE OF ADMISSION:  08/15/2006  DATE OF DISCHARGE:  08/09/2006                              HISTORY & PHYSICAL   CHIEF COMPLAINT:  Bladder tumor.   HISTORY OF PRESENT ILLNESS:  The patient is 77 years old female who was  seen in the office on March at 2008 with a history of recurrent urinary  tract infection and gross hematuria.  CT scan showed a left renal cyst  and a filling defect in the bladder.  Cystoscopy showed a large  papillary tumor in the bladder.  She had TUR of the bladder tumor on  July 29, 2006, and it showed a high-grade papillary tumor with invasion  into the smooth muscle.  Bone scan is negative for metastatic disease.  Treatment options were discussed with the patient and her family; and  they agreed to have a radical cystectomy with an ileal loop.  She is  admitted today for the procedure.   PAST MEDICAL HISTORY:  Positive for kidney stones and hypertension   PAST SURGICAL HISTORY:  Cholecystectomy, hysterectomy, and appendectomy.   MEDICATIONS:  1. Amlodipine 10 mg  2. Fosamax 70 mg weekly.  3. Calcium and vitamin D.   ALLERGIES:  She is allergic to CODEINE.   SOCIAL HISTORY:  She is a widow, does not smoke nor drink.  She has four  children.   FAMILY HISTORY:  Her father died of a heart attack at age of 42.  Her  mother died of Alzheimer's dementia and one sister had lung cancer.   REVIEW OF SYSTEMS:  Positive for swelling on the leg, easy bruising,  as  noted, in the HPI; and everything else is negative.   PHYSICAL EXAMINATION:  GENERAL:  This is a well built 55 years of female  in no acute distress.  She is alert and oriented.  VITAL SIGNS:  Blood pressure is 150/70, pulse 76, respirations 16 weight  49.09 kg and temperature 97.5.  HEENT:  Her head is normal.  Pupils are equal and reactive to light and  accommodation.  Ears, nose and throat are within normal limits.  NECK:  Supple.  No cervical lymph nodes, no thyromegaly.  CHEST:  Symmetrical.  LUNGS:  Fully expanded and clear to percussion and auscultation.  HEART:  Regular rate and rhythm, no gallops.  ABDOMEN:  Soft, nondistended, nontender.  She has no CVA tenderness.  Kidneys are not palpable.  She has no hepatomegaly, no splenomegaly.  Bladder is not distended.  She has no inguinal hernia.  GENITOURINARY:  Meatus is normal.  She has no, caruncle. She has a  moderate cystocele.  She is status post hysterectomy and there is no  adnexal mass.   IMPRESSION:  Transitional cell carcinoma of the bladder with muscle  invasion and hypertension.      Lindaann Slough, M.D.  Electronically Signed  MN/MEDQ  D:  08/15/2006  T:  08/15/2006  Job:  [id number]

## 2010-12-09 ENCOUNTER — Inpatient Hospital Stay (HOSPITAL_COMMUNITY)
Admission: AD | Admit: 2010-12-09 | Discharge: 2010-12-11 | DRG: 690 | Disposition: A | Discharge status: Home / Self Care | Payer: Medicare Other | Source: Ambulatory Visit | Attending: Urology | Admitting: Urology

## 2010-12-09 DIAGNOSIS — Z8551 Personal history of malignant neoplasm of bladder: Secondary | ICD-10-CM

## 2010-12-09 DIAGNOSIS — Z936 Other artificial openings of urinary tract status: Secondary | ICD-10-CM

## 2010-12-09 DIAGNOSIS — I10 Essential (primary) hypertension: Secondary | ICD-10-CM | POA: Diagnosis present

## 2010-12-09 DIAGNOSIS — R509 Fever, unspecified: Secondary | ICD-10-CM | POA: Diagnosis present

## 2010-12-09 DIAGNOSIS — N12 Tubulo-interstitial nephritis, not specified as acute or chronic: Principal | ICD-10-CM | POA: Diagnosis present

## 2010-12-09 DIAGNOSIS — E860 Dehydration: Secondary | ICD-10-CM | POA: Diagnosis present

## 2010-12-09 LAB — BASIC METABOLIC PANEL
BUN: 20 mg/dL (ref 6–23)
Chloride: 100 mEq/L (ref 96–112)
Glucose, Bld: 82 mg/dL (ref 70–99)
Potassium: 3.9 mEq/L (ref 3.5–5.1)

## 2010-12-09 LAB — CBC
HCT: 28.8 % — ABNORMAL LOW (ref 36.0–46.0)
Hemoglobin: 9.7 g/dL — ABNORMAL LOW (ref 12.0–15.0)
WBC: 8.5 10*3/uL (ref 4.0–10.5)

## 2010-12-11 LAB — BASIC METABOLIC PANEL
BUN: 13 mg/dL (ref 6–23)
Calcium: 8.2 mg/dL — ABNORMAL LOW (ref 8.4–10.5)
GFR calc non Af Amer: 52 mL/min — ABNORMAL LOW (ref >60)
Glucose, Bld: 85 mg/dL (ref 70–99)

## 2010-12-11 LAB — CBC
HCT: 29.1 % — ABNORMAL LOW (ref 36.0–46.0)
Hemoglobin: 9.9 g/dL — ABNORMAL LOW (ref 12.0–15.0)
MCH: 31.6 pg (ref 26.0–34.0)
MCHC: 34 g/dL (ref 30.0–36.0)

## 2010-12-16 LAB — CULTURE, BLOOD (ROUTINE X 2)
Culture  Setup Time: 201207260047
Culture: NO GROWTH

## 2010-12-19 NOTE — Discharge Summary (Signed)
  [name], HOHEISEL NO.:  [id number]  MEDICAL RECORD NO.:  [id number]  LOCATION:  1315                         FACILITY:  [facility]  PHYSICIAN:  Danae Chen, M.D.  DATE OF BIRTH:  1928-03-12  DATE OF ADMISSION:  12/09/2010 DATE OF DISCHARGE:  12/11/2010                              DISCHARGE SUMMARY   DISCHARGE DIAGNOSES:  Pyelonephritis urinary tract infection status post radical cholecystectomy, 40 cc of the bladder.  The patient is an 77 year old female status post radical cholecystectomy several years ago.  She started having fever on December 07, 2010.  She saw her primary care physician and urine culture was obtained and she was started on Cipro.  She was given 1 g of Rocephin IV in the office; however, she did not have any improvement.  She was seen in the office on December 09, 2010, and appeared dehydrated.  She had nausea, anorexia, chills and fever.  PHYSICAL EXAMINATION:  VITAL SIGNS:  On physical examination, her temperature was 98.2 at the time of office visit, blood pressure 148/58, pulse 70, respirations 16. LUNGS:  Clear. HEART:  Regular rhythm. ABDOMEN:  Soft.  She has an ileal conduit that was draining clear urine.  Her hemoglobin on admission was 9.7, hematocrit 28.8 and WBC 8.5. Sodium was 134, potassium 3.9, BUN 20, creatinine 1.2.  She was started on IV Ancef and gentamicin and received fluids for rehydration.  Her urine culture from her primary care physician's office came back positive for Citrobacter but that was resistant to cefazolin.  She was then switched to Rocephin and she was continued on gentamicin.  She gradually improved.  She started feeling better on December 10, 2010, and started eating better and was tolerating her diet well.  On December 11, 2010, she was afebrile.  She was eating well.  She felt like better than when she came in the hospital.  Her BUN was 13, creatinine 1.03, sodium 138, potassium 3.8, hemoglobin 9.9, hematocrit  29.1 and WBC 3.9.  She was then discharged home on nitrofurantoin 100 mg twice a day and her home medication.  DISCHARGE DIET:  Regular.  CONDITION ON DISCHARGE:  Improved.  She is instructed not to do any lifting, straining.  She will be followed in the office as an outpatient.     Danae Chen, M.D.     MN/MEDQ  D:  12/11/2010  T:  12/11/2010  Job:  [id number]  cc:   Tracey Harries, M.D. Fax: [phone]  Electronically Signed by Lindaann Slough M.D. on 12/19/2010 07:35:08 AM

## 2011-02-08 LAB — PROTIME-INR
INR: 0.9
Prothrombin Time: 12.6

## 2011-02-08 LAB — CBC
Platelets: 204
RBC: 3.88
WBC: 2.8 — ABNORMAL LOW

## 2011-02-09 LAB — CLOSTRIDIUM DIFFICILE EIA
C difficile Toxins A+B, EIA: NEGATIVE
C difficile Toxins A+B, EIA: NEGATIVE
C difficile Toxins A+B, EIA: NEGATIVE

## 2011-02-09 LAB — COMPREHENSIVE METABOLIC PANEL WITH GFR
ALT: 22
AST: 25
Albumin: 2.6 — ABNORMAL LOW
Alkaline Phosphatase: 73
BUN: 4 — ABNORMAL LOW
CO2: 22
Calcium: 8.3 — ABNORMAL LOW
Chloride: 114 — ABNORMAL HIGH
Creatinine, Ser: 0.82
GFR calc non Af Amer: >60
Glucose, Bld: 90
Potassium: 3.4 — ABNORMAL LOW
Sodium: 139
Total Bilirubin: 0.7
Total Protein: 5.9 — ABNORMAL LOW

## 2011-02-09 LAB — COMPREHENSIVE METABOLIC PANEL
ALT: 20
AST: 21
AST: 45 — ABNORMAL HIGH
Albumin: 4.1
Albumin: 3.4 — ABNORMAL LOW
BUN: 7
CO2: 29
CO2: 23
Calcium: 9.5
Calcium: 9.6
Chloride: 105
Chloride: 109
Creatinine, Ser: 0.92
Creatinine, Ser: 0.96
GFR calc Af Amer: >60
GFR calc Af Amer: >60
GFR calc non Af Amer: 50 — ABNORMAL LOW
GFR calc non Af Amer: 59 — ABNORMAL LOW
Glucose, Bld: 99
Sodium: 137
Total Bilirubin: 0.6
Total Bilirubin: 0.4

## 2011-02-09 LAB — OCCULT BLOOD X 1 CARD TO LAB, STOOL
Fecal Occult Bld: NEGATIVE
Fecal Occult Bld: NEGATIVE
Fecal Occult Bld: POSITIVE

## 2011-02-09 LAB — DIFFERENTIAL
Basophils Absolute: 0
Basophils Absolute: 0
Basophils Absolute: 0
Basophils Relative: 0
Eosinophils Absolute: 0
Eosinophils Absolute: 0
Eosinophils Absolute: 0
Eosinophils Absolute: 0.1
Eosinophils Relative: 1
Eosinophils Relative: 1
Eosinophils Relative: 1
Lymphocytes Relative: 41
Lymphocytes Relative: 38
Lymphocytes Relative: 42
Lymphocytes Relative: 42
Lymphs Abs: 1.1
Lymphs Abs: 0.9
Lymphs Abs: 1.2
Lymphs Abs: 1.3
Lymphs Abs: 1.4
Monocytes Absolute: 0.3
Monocytes Absolute: 0.2
Monocytes Absolute: 0.2
Monocytes Absolute: 0.2
Monocytes Relative: 10
Monocytes Relative: 6
Neutro Abs: 0.7 — ABNORMAL LOW
Neutro Abs: 1.2 — ABNORMAL LOW
Neutro Abs: 1.5 — ABNORMAL LOW
Neutrophils Relative %: 45
Neutrophils Relative %: 50

## 2011-02-09 LAB — URINALYSIS, ROUTINE W REFLEX MICROSCOPIC
Bilirubin Urine: NEGATIVE
Bilirubin Urine: NEGATIVE
Glucose, UA: NEGATIVE
Glucose, UA: NEGATIVE
Glucose, UA: NEGATIVE
Hgb urine dipstick: NEGATIVE
Ketones, ur: NEGATIVE
Ketones, ur: NEGATIVE
Ketones, ur: NEGATIVE
Nitrite: NEGATIVE
Protein, ur: NEGATIVE
Protein, ur: NEGATIVE
pH: 7
pH: 7

## 2011-02-09 LAB — CULTURE, BLOOD (ROUTINE X 2)

## 2011-02-09 LAB — CBC
HCT: 28.9 — ABNORMAL LOW
HCT: 29.2 — ABNORMAL LOW
Hemoglobin: 10 — ABNORMAL LOW
Hemoglobin: 9.1 — ABNORMAL LOW
Hemoglobin: 9.7 — ABNORMAL LOW
MCHC: 35.2
MCHC: 33.4
MCHC: 33.5
MCHC: 34
MCV: 93.6
MCV: 93.2
MCV: 93.5
MCV: 94.1
MCV: 94.8
Platelets: 206
Platelets: 247
Platelets: 261
Platelets: 276
Platelets: 356
RBC: 3.03 — ABNORMAL LOW
RBC: 3.84 — ABNORMAL LOW
RBC: 2.89 — ABNORMAL LOW
RBC: 3.24 — ABNORMAL LOW
RDW: 12.9
RDW: 12.5
RDW: 12.5
WBC: 2.6 — ABNORMAL LOW
WBC: 2.2 — ABNORMAL LOW
WBC: 2.4 — ABNORMAL LOW
WBC: 3.2 — ABNORMAL LOW
WBC: 3.3 — ABNORMAL LOW
WBC: 3.4 — ABNORMAL LOW

## 2011-02-09 LAB — TYPE AND SCREEN
ABO/RH(D): O POS
Antibody Screen: NEGATIVE

## 2011-02-09 LAB — PROTEIN ELECTROPH W RFLX QUANT IMMUNOGLOBULINS
Albumin ELP: 49.3 — ABNORMAL LOW
Beta 2: 5.9
Beta Globulin: 4.8
Gamma Globulin: 26.4 — ABNORMAL HIGH
M-Spike, %: NOT DETECTED

## 2011-02-09 LAB — URINE CULTURE
Colony Count: >=100000
Colony Count: 9000
Colony Count: >=100000
Special Requests: NEGATIVE

## 2011-02-09 LAB — MAGNESIUM: Magnesium: 1.8

## 2011-02-09 LAB — VITAMIN B12: Vitamin B-12: 322 (ref 211–911)

## 2011-02-09 LAB — BASIC METABOLIC PANEL
CO2: 23
Calcium: 8.2 — ABNORMAL LOW
Creatinine, Ser: 0.79
GFR calc Af Amer: >60
GFR calc non Af Amer: >60
Glucose, Bld: 90

## 2011-02-09 LAB — URINE MICROSCOPIC-ADD ON

## 2011-02-09 LAB — BONE MARROW EXAM

## 2011-02-09 LAB — CHROMOSOME ANALYSIS, BONE MARROW

## 2011-02-09 LAB — BLEEDING TIME: Bleeding Time: 5

## 2011-02-09 LAB — PROTIME-INR: Prothrombin Time: 12.9

## 2011-02-22 LAB — BASIC METABOLIC PANEL
BUN: 34 — ABNORMAL HIGH
CO2: 24
Calcium: 8.2 — ABNORMAL LOW
Calcium: 8.3 — ABNORMAL LOW
Creatinine, Ser: 1.4 — ABNORMAL HIGH
GFR calc Af Amer: 44 — ABNORMAL LOW
GFR calc non Af Amer: 36 — ABNORMAL LOW
Glucose, Bld: 125 — ABNORMAL HIGH
Sodium: 126 — ABNORMAL LOW

## 2011-02-22 LAB — URINALYSIS, ROUTINE W REFLEX MICROSCOPIC
Bilirubin Urine: NEGATIVE
Nitrite: NEGATIVE
Specific Gravity, Urine: 1.012
Urobilinogen, UA: 0.2
pH: 6.5

## 2011-02-22 LAB — CBC
Hemoglobin: 9.6 — ABNORMAL LOW
MCHC: 34.9
Platelets: 124 — ABNORMAL LOW
RBC: 3.08 — ABNORMAL LOW
RDW: 13.4
WBC: 10.1

## 2011-02-22 LAB — URINE CULTURE

## 2011-02-22 LAB — URINE MICROSCOPIC-ADD ON

## 2011-02-22 LAB — CULTURE, BLOOD (ROUTINE X 2): Culture: NO GROWTH

## 2011-03-12 ENCOUNTER — Emergency Department (HOSPITAL_COMMUNITY)
Admission: EM | Admit: 2011-03-12 | Discharge: 2011-03-12 | Disposition: A | Discharge status: Home / Self Care | Payer: Medicare Other | Attending: Emergency Medicine | Admitting: Emergency Medicine

## 2011-03-12 ENCOUNTER — Emergency Department (HOSPITAL_COMMUNITY): Payer: Medicare Other

## 2011-03-12 DIAGNOSIS — R059 Cough, unspecified: Secondary | ICD-10-CM | POA: Insufficient documentation

## 2011-03-12 DIAGNOSIS — I10 Essential (primary) hypertension: Secondary | ICD-10-CM | POA: Insufficient documentation

## 2011-03-12 DIAGNOSIS — Z79899 Other long term (current) drug therapy: Secondary | ICD-10-CM | POA: Insufficient documentation

## 2011-03-12 DIAGNOSIS — R5381 Other malaise: Secondary | ICD-10-CM | POA: Insufficient documentation

## 2011-03-12 DIAGNOSIS — M810 Age-related osteoporosis without current pathological fracture: Secondary | ICD-10-CM | POA: Insufficient documentation

## 2011-03-12 DIAGNOSIS — R05 Cough: Secondary | ICD-10-CM | POA: Insufficient documentation

## 2011-03-12 DIAGNOSIS — Z8551 Personal history of malignant neoplasm of bladder: Secondary | ICD-10-CM | POA: Insufficient documentation

## 2011-03-12 DIAGNOSIS — R509 Fever, unspecified: Secondary | ICD-10-CM | POA: Insufficient documentation

## 2011-03-12 DIAGNOSIS — N390 Urinary tract infection, site not specified: Secondary | ICD-10-CM | POA: Insufficient documentation

## 2011-03-12 LAB — BASIC METABOLIC PANEL
GFR calc Af Amer: 53 mL/min — ABNORMAL LOW (ref >90)
GFR calc non Af Amer: 46 mL/min — ABNORMAL LOW (ref >90)
Glucose, Bld: 110 mg/dL — ABNORMAL HIGH (ref 70–99)
Potassium: 4.7 mEq/L (ref 3.5–5.1)
Sodium: 135 mEq/L (ref 135–145)

## 2011-03-12 LAB — URINALYSIS, ROUTINE W REFLEX MICROSCOPIC
Bilirubin Urine: NEGATIVE
Glucose, UA: NEGATIVE mg/dL
Hgb urine dipstick: NEGATIVE
Ketones, ur: NEGATIVE mg/dL
Nitrite: POSITIVE — AB
Protein, ur: NEGATIVE mg/dL
Specific Gravity, Urine: 1.007 (ref 1.005–1.030)
Urobilinogen, UA: 0.2 mg/dL (ref 0.0–1.0)
pH: 6.5 (ref 5.0–8.0)

## 2011-03-12 LAB — DIFFERENTIAL
Basophils Absolute: 0 K/uL (ref 0.0–0.1)
Basophils Relative: 0 % (ref 0–1)
Eosinophils Absolute: 0 K/uL (ref 0.0–0.7)
Eosinophils Relative: 0 % (ref 0–5)
Lymphocytes Relative: 12 % (ref 12–46)
Lymphs Abs: 1.1 K/uL (ref 0.7–4.0)
Monocytes Absolute: 0.6 K/uL (ref 0.1–1.0)
Monocytes Relative: 7 % (ref 3–12)
Neutro Abs: 7.3 K/uL (ref 1.7–7.7)
Neutrophils Relative %: 81 % — ABNORMAL HIGH (ref 43–77)

## 2011-03-12 LAB — CBC
Hemoglobin: 11.8 g/dL — ABNORMAL LOW (ref 12.0–15.0)
MCHC: 33.5 g/dL (ref 30.0–36.0)
Platelets: 182 10*3/uL (ref 150–400)
RDW: 13.1 % (ref 11.5–15.5)

## 2011-03-12 LAB — URINE MICROSCOPIC-ADD ON

## 2011-03-14 LAB — URINE CULTURE: Culture  Setup Time: 201210270204

## 2011-03-15 ENCOUNTER — Emergency Department (HOSPITAL_COMMUNITY): Payer: Medicare Other

## 2011-03-15 ENCOUNTER — Inpatient Hospital Stay (HOSPITAL_COMMUNITY)
Admission: EM | Admit: 2011-03-15 | Discharge: 2011-03-20 | DRG: 690 | Disposition: A | Discharge status: Home Health | Payer: Medicare Other | Attending: Internal Medicine | Admitting: Internal Medicine

## 2011-03-15 DIAGNOSIS — R5381 Other malaise: Secondary | ICD-10-CM | POA: Diagnosis present

## 2011-03-15 DIAGNOSIS — K219 Gastro-esophageal reflux disease without esophagitis: Secondary | ICD-10-CM | POA: Diagnosis present

## 2011-03-15 DIAGNOSIS — R112 Nausea with vomiting, unspecified: Secondary | ICD-10-CM | POA: Diagnosis present

## 2011-03-15 DIAGNOSIS — A498 Other bacterial infections of unspecified site: Secondary | ICD-10-CM | POA: Diagnosis present

## 2011-03-15 DIAGNOSIS — F068 Other specified mental disorders due to known physiological condition: Secondary | ICD-10-CM | POA: Diagnosis present

## 2011-03-15 DIAGNOSIS — J45909 Unspecified asthma, uncomplicated: Secondary | ICD-10-CM | POA: Diagnosis present

## 2011-03-15 DIAGNOSIS — N390 Urinary tract infection, site not specified: Principal | ICD-10-CM | POA: Diagnosis present

## 2011-03-15 DIAGNOSIS — K2980 Duodenitis without bleeding: Secondary | ICD-10-CM | POA: Diagnosis present

## 2011-03-15 DIAGNOSIS — D638 Anemia in other chronic diseases classified elsewhere: Secondary | ICD-10-CM | POA: Diagnosis present

## 2011-03-15 DIAGNOSIS — B965 Pseudomonas (aeruginosa) (mallei) (pseudomallei) as the cause of diseases classified elsewhere: Secondary | ICD-10-CM | POA: Diagnosis present

## 2011-03-15 DIAGNOSIS — I10 Essential (primary) hypertension: Secondary | ICD-10-CM | POA: Diagnosis present

## 2011-03-15 DIAGNOSIS — R0602 Shortness of breath: Secondary | ICD-10-CM | POA: Diagnosis present

## 2011-03-15 DIAGNOSIS — N1330 Unspecified hydronephrosis: Secondary | ICD-10-CM | POA: Diagnosis present

## 2011-03-15 LAB — CBC
Hemoglobin: 12.5 g/dL (ref 12.0–15.0)
MCH: 31.6 pg (ref 26.0–34.0)
MCHC: 33.9 g/dL (ref 30.0–36.0)
MCV: 93.4 fL (ref 78.0–100.0)
Platelets: 210 10*3/uL (ref 150–400)

## 2011-03-15 LAB — CK TOTAL AND CKMB (NOT AT ARMC)
CK, MB: 2.5 ng/mL (ref 0.3–4.0)
Total CK: 46 U/L (ref 7–177)

## 2011-03-15 LAB — DIFFERENTIAL
Basophils Relative: 0 % (ref 0–1)
Eosinophils Absolute: 0.1 10*3/uL (ref 0.0–0.7)
Lymphs Abs: 0.7 10*3/uL (ref 0.7–4.0)
Monocytes Absolute: 0.4 10*3/uL (ref 0.1–1.0)
Monocytes Relative: 6 % (ref 3–12)

## 2011-03-15 LAB — URINALYSIS, ROUTINE W REFLEX MICROSCOPIC
Bilirubin Urine: NEGATIVE
Hgb urine dipstick: NEGATIVE
Nitrite: NEGATIVE
Protein, ur: NEGATIVE mg/dL
Specific Gravity, Urine: 1.009 (ref 1.005–1.030)
Urobilinogen, UA: 0.2 mg/dL (ref 0.0–1.0)

## 2011-03-15 LAB — POCT I-STAT, CHEM 8
BUN: 20 mg/dL (ref 6–23)
Calcium, Ion: 1.21 mmol/L (ref 1.12–1.32)
Chloride: 107 mEq/L (ref 96–112)
Creatinine, Ser: 1.2 mg/dL — ABNORMAL HIGH (ref 0.50–1.10)
TCO2: 20 mmol/L (ref 0–100)

## 2011-03-15 LAB — HEPATIC FUNCTION PANEL
ALT: 26 U/L (ref 0–35)
Alkaline Phosphatase: 81 U/L (ref 39–117)
Bilirubin, Direct: <0.1 mg/dL (ref 0.0–0.3)
Total Protein: 8.7 g/dL — ABNORMAL HIGH (ref 6.0–8.3)

## 2011-03-15 LAB — D-DIMER, QUANTITATIVE: D-Dimer, Quant: 2.91 ug/mL-FEU — ABNORMAL HIGH (ref 0.00–0.48)

## 2011-03-15 LAB — POCT I-STAT TROPONIN I: Troponin i, poc: 0.01 ng/mL (ref 0.00–0.08)

## 2011-03-15 LAB — URINE MICROSCOPIC-ADD ON

## 2011-03-15 LAB — LIPASE, BLOOD: Lipase: 40 U/L (ref 11–59)

## 2011-03-15 MED ORDER — IOHEXOL 300 MG/ML  SOLN
100.0000 mL | Freq: Once | INTRAMUSCULAR | Status: AC | PRN
  Administered 2011-03-15: 100 mL via INTRAVENOUS

## 2011-03-16 ENCOUNTER — Inpatient Hospital Stay (HOSPITAL_COMMUNITY): Payer: Medicare Other

## 2011-03-16 LAB — CBC
HCT: 30.4 % — ABNORMAL LOW (ref 36.0–46.0)
Hemoglobin: 10.2 g/dL — ABNORMAL LOW (ref 12.0–15.0)
MCH: 31.3 pg (ref 26.0–34.0)
RBC: 3.23 MIL/uL — ABNORMAL LOW (ref 3.87–5.11)

## 2011-03-16 LAB — CK TOTAL AND CKMB (NOT AT ARMC)
CK, MB: 2.9 ng/mL (ref 0.3–4.0)
CK, MB: 3.3 ng/mL (ref 0.3–4.0)
Relative Index: INVALID (ref 0.0–2.5)
Relative Index: INVALID (ref 0.0–2.5)
Total CK: 47 U/L (ref 7–177)

## 2011-03-16 LAB — COMPREHENSIVE METABOLIC PANEL
AST: 28 U/L (ref 0–37)
Albumin: 2.9 g/dL — ABNORMAL LOW (ref 3.5–5.2)
Calcium: 8.3 mg/dL — ABNORMAL LOW (ref 8.4–10.5)
Creatinine, Ser: 1.08 mg/dL (ref 0.50–1.10)
Sodium: 139 mEq/L (ref 135–145)

## 2011-03-16 LAB — PROTIME-INR
INR: 1.1 (ref 0.00–1.49)
Prothrombin Time: 14.4 seconds (ref 11.6–15.2)

## 2011-03-16 LAB — PHOSPHORUS: Phosphorus: 1.9 mg/dL — ABNORMAL LOW (ref 2.3–4.6)

## 2011-03-16 LAB — TSH: TSH: 3.131 u[IU]/mL (ref 0.350–4.500)

## 2011-03-16 MED ORDER — IOHEXOL 300 MG/ML  SOLN
75.0000 mL | Freq: Once | INTRAMUSCULAR | Status: AC | PRN
  Administered 2011-03-16: 75 mL via INTRAVENOUS

## 2011-03-16 NOTE — H&P (Signed)
[name], [name] NO.:  [id number]  MEDICAL RECORD NO.:  [id number]  LOCATION:  WLED                         FACILITY:  [facility]  PHYSICIAN:  Conley Canal, MD      DATE OF BIRTH:  1927-12-31  DATE OF ADMISSION:  03/15/2011 DATE OF DISCHARGE:                             HISTORY & PHYSICAL   PRIMARY CARE PHYSICIAN:  Tracey Harries, MD  UROLOGIST:  Lindaann Slough, MD  CHIEF COMPLAINT:  Nausea and shortness of breath ongoing for the last 1 to 2 weeks.  HISTORY OF PRESENT ILLNESS:  [name] is a pleasant 77 year old female who has history of hypertension, cataracts, bladder cancer, status post urinary bladder removal, now with urostomy tube, dementia, which is apparently mild, who comes in today because she has had nausea and some shortness of breath.  She was seen in the emergency room 3 days ago for nausea and abdominal pain and at that time, she was found to have Escherichia coli urinary tract infection.  She was sent home on Macrobid.  She has been able to keep antibiotics down, but she has continued to have nausea and she is concerned about worsening shortness of breath with exertion, which started about 2 weeks ago.  She has had fever and in the emergency room today, her temperature was 100.2.  She has not had any diarrhea.  No cough.  No chest pain.  Her D-dimer today was 2.91.  At the time of my evaluation, she is awaiting CT chest with contrast to evaluate for PE.  Otherwise, she also complains of an occasional headache.  PAST MEDICAL HISTORY: 1. History of bladder cancer, status post bladder resection, status     post urostomy tube. 2. Recurrent urinary tract infections. 3. Malignant hypertension. 4. Status post cholecystectomy. 5. Dementia.  ALLERGIES: 1. CODEINE. 2. REGLAN.  SOCIAL HISTORY:  The patient lives alone.  Denies cigarette smoking, alcohol, or illicit drugs.  FAMILY HISTORY:  Positive for history of CVAs in her sister and  mother.  HOME MEDICATIONS:  Amlodipine, aspirin, clonidine, donepezil, Fosamax, Macrobid, and Zofran.  REVIEW OF SYSTEMS:  Unremarkable except as highlighted in the history of present illness.  PHYSICAL EXAMINATION:  GENERAL:  This is an elderly female accompanied by family members at the bedside.  She is not in acute distress. VITAL SIGNS:  Blood pressure is 153/81, heart rate is 69, temperature 100.2, respirations 24, and oxygen saturation is 100% on 2 L nasal cannula. HEAD, EARS, EYES, NOSE, AND THROAT:  Pupils equal and reacting to light. NECK:  No jugular venous distention.  No carotid bruits. RESPIRATORY SYSTEM:  Good air entry bilaterally with no rhonchi, rales, or wheezes. CARDIOVASCULAR SYSTEM:  First and second heart sounds heard.  No murmurs.  Pulse regular. ABDOMEN:  Urostomy tube in place.  Otherwise, abdomen is soft and nontender.  No palpable organomegaly.  Bowel sounds are normal. CNS:  The patient is alert and oriented to person, place, and time with no acute focal neurological deficits. EXTREMITIES:  No pedal edema.  Peripheral pulses are otherwise equal.  LABORATORY DATA:  Labs reviewed significant for CBC normal except for 82% neutrophils.  Chemistry is significant for  BUN 20 and creatinine 1.2.  Urine culture on October 26th, grew Escherichia coli, which is Therapist, sports.  D-dimer was 2.91.  Urinalysis showed trace leukocytes, microscopy 3 to 6, and few bacteria.  Chest x-ray and abdominal x-ray showed hyperaeration with no active lung disease, no bowel obstruction, no free air.  EKG shows normal sinus rhythm with nonspecific ST-T wave changes.  IMPRESSION:  This is an 77 year old female who has a urostomy tube, status post transurethral resection of the bladder for transitional cell cancer, who comes in with some nausea and is being treated for Escherichia coli urinary tract infection as an outpatient.  She also has shortness of breath whose etiology is not  clear at this point.  The differential diagnosis includes pulmonary embolism, given the elevated D- dimer level and it could also be cardiac in origin.  She does not have signs of congestion at present.  The low-grade fever despite oral antibiotics is still concerning for possible pyelonephritis.  PLAN: 1. Escherichia coli urinary tract infection.  There is possibility of     pyelonephritis.  We will admit the patient as inpatient due to the     persistent nausea and place her on Levaquin IV, follow urine     cultures, do not expect significant changes in the sensitivities.     We will also gently rehydrate the patient and give antiemetics. 2. Shortness of breath.  The main concern is for pulmonary embolism.     We will obtain CT angiogram of the chest to rule out PE.     Meanwhile, she will be covered with Lovenox.  We will also obtain     serial cardiac enzymes, proBNP, consider 2-D echocardiogram     depending on the CT, chest findings, and serial cardiac enzymes. 3. Hypertension seems uncontrolled.  Plan to resume home medications     including clonidine and Norvasc. 4. GI prophylaxis, PPI. 5. Dementia.  Plan to resume home medications once confirmed. 6. Disposition could probably eventually go back home once clinically     stable.     Conley Canal, MD     SR/MEDQ  D:  03/15/2011  T:  03/15/2011  Job:  [id number]  cc:   Lindaann Slough, M.D. Fax: [phone]  Tracey Harries, M.D. Fax: [phone]  Electronically Signed by Conley Canal  on 03/16/2011 09:34:49 PM

## 2011-03-17 DIAGNOSIS — I369 Nonrheumatic tricuspid valve disorder, unspecified: Secondary | ICD-10-CM

## 2011-03-17 LAB — BASIC METABOLIC PANEL
CO2: 22 mEq/L (ref 19–32)
Calcium: 8.8 mg/dL (ref 8.4–10.5)
Sodium: 140 mEq/L (ref 135–145)

## 2011-03-17 LAB — CBC
Hemoglobin: 10.9 g/dL — ABNORMAL LOW (ref 12.0–15.0)
MCHC: 33.9 g/dL (ref 30.0–36.0)
RBC: 3.48 MIL/uL — ABNORMAL LOW (ref 3.87–5.11)
WBC: 3 10*3/uL — ABNORMAL LOW (ref 4.0–10.5)

## 2011-03-17 LAB — PRO B NATRIURETIC PEPTIDE: Pro B Natriuretic peptide (BNP): 910.2 pg/mL — ABNORMAL HIGH (ref 0–450)

## 2011-03-17 LAB — PHOSPHORUS: Phosphorus: 2.3 mg/dL (ref 2.3–4.6)

## 2011-03-17 LAB — MAGNESIUM: Magnesium: 2.4 mg/dL (ref 1.5–2.5)

## 2011-03-18 LAB — URINE CULTURE

## 2011-03-18 LAB — CBC
Hemoglobin: 10.2 g/dL — ABNORMAL LOW (ref 12.0–15.0)
MCHC: 33.2 g/dL (ref 30.0–36.0)
RDW: 13.5 % (ref 11.5–15.5)

## 2011-03-18 LAB — BASIC METABOLIC PANEL
GFR calc Af Amer: 54 mL/min — ABNORMAL LOW (ref >90)
GFR calc non Af Amer: 47 mL/min — ABNORMAL LOW (ref >90)
Glucose, Bld: 103 mg/dL — ABNORMAL HIGH (ref 70–99)
Potassium: 4 mEq/L (ref 3.5–5.1)
Sodium: 137 mEq/L (ref 135–145)

## 2011-03-19 LAB — CULTURE, BLOOD (ROUTINE X 2)
Culture  Setup Time: 201210270126
Culture: NO GROWTH

## 2011-03-19 MED ORDER — ONDANSETRON HCL 4 MG PO TABS: 4.0000 mg | ORAL_TABLET | Freq: Four times a day (QID) | ORAL | Status: DC | PRN

## 2011-03-19 MED ORDER — LOPERAMIDE HCL 2 MG PO CAPS: 2.0000 mg | ORAL_CAPSULE | Freq: Two times a day (BID) | ORAL | Status: DC

## 2011-03-19 MED ORDER — BIOTENE DRY MOUTH MT LIQD: 15.0000 mL | Freq: Two times a day (BID) | OROMUCOSAL | Status: DC

## 2011-03-19 MED ORDER — ALBUTEROL SULFATE (5 MG/ML) 0.5% IN NEBU
2.5000 mg | INHALATION_SOLUTION | RESPIRATORY_TRACT | Status: DC | PRN
  Filled 2011-03-19: qty 20

## 2011-03-19 MED ORDER — ASPIRIN EC 81 MG PO TBEC
81.0000 mg | DELAYED_RELEASE_TABLET | Freq: Every day | ORAL | Status: DC
  Filled 2011-03-19 (×2): qty 1

## 2011-03-19 MED ORDER — CLONIDINE HCL 0.1 MG PO TABS
0.1000 mg | ORAL_TABLET | Freq: Two times a day (BID) | ORAL | Status: DC
  Filled 2011-03-19 (×4): qty 1

## 2011-03-19 MED ORDER — ZOLPIDEM TARTRATE 5 MG PO TABS: 5.0000 mg | ORAL_TABLET | Freq: Every evening | ORAL | Status: DC | PRN

## 2011-03-19 MED ORDER — ACETAMINOPHEN 325 MG PO TABS: 650.0000 mg | ORAL_TABLET | ORAL | Status: DC | PRN

## 2011-03-19 MED ORDER — PREDNISONE 20 MG PO TABS
20.0000 mg | ORAL_TABLET | Freq: Every day | ORAL | Status: DC
  Filled 2011-03-19 (×2): qty 1

## 2011-03-19 MED ORDER — AMLODIPINE BESYLATE 5 MG PO TABS
5.0000 mg | ORAL_TABLET | Freq: Every day | ORAL | Status: DC
  Filled 2011-03-19 (×2): qty 1

## 2011-03-19 MED ORDER — MORPHINE SULFATE 2 MG/ML IJ SOLN: 2.0000 mg | INTRAMUSCULAR | Status: DC | PRN

## 2011-03-19 MED ORDER — SODIUM CHLORIDE 0.9 % IJ SOLN: 3.0000 mL | Freq: Two times a day (BID) | INTRAMUSCULAR | Status: DC

## 2011-03-19 MED ORDER — PANTOPRAZOLE SODIUM 40 MG PO TBEC: 40.0000 mg | DELAYED_RELEASE_TABLET | Freq: Two times a day (BID) | ORAL | Status: DC

## 2011-03-19 MED ORDER — ONDANSETRON HCL 4 MG/2ML IJ SOLN: 4.0000 mg | Freq: Four times a day (QID) | INTRAMUSCULAR | Status: DC | PRN

## 2011-03-19 MED ORDER — SODIUM CHLORIDE 0.9 % IV SOLN: INTRAVENOUS | Status: DC | PRN

## 2011-03-19 MED ORDER — SODIUM CHLORIDE 0.9 % IV SOLN: INTRAVENOUS | Status: DC

## 2011-03-19 MED ORDER — LEVOFLOXACIN 750 MG PO TABS
750.0000 mg | ORAL_TABLET | ORAL | Status: DC
  Filled 2011-03-19 (×2): qty 1

## 2011-03-20 DIAGNOSIS — R112 Nausea with vomiting, unspecified: Secondary | ICD-10-CM

## 2011-03-20 DIAGNOSIS — R197 Diarrhea, unspecified: Secondary | ICD-10-CM

## 2011-03-20 NOTE — Discharge Summary (Signed)
NAMEDEVYNN, HESSLER NO.:  [id number]  MEDICAL RECORD NO.:  [id number]  LOCATION:  1313                         FACILITY:  [facility]  PHYSICIAN:  Andreas Blower, MD       DATE OF BIRTH:  07/14/1927  DATE OF ADMISSION:  03/15/2011 DATE OF DISCHARGE:                        DISCHARGE SUMMARY - REFERRING   PRIMARY CARE PHYSICIAN:  Tracey Harries, M.D.  UROLOGIST:  Lindaann Slough, M.D.  GASTROENTEROLOGIST:  Jordan Hawks. Elnoria Howard, MD  DISCHARGE DIAGNOSES: 1. Shortness of breath, likely due to reactive airway disease. 2. Nausea and vomiting secondary to gastroesophageal reflux disease     and duodenitis. 3. Bilateral hydronephrosis, left greater than right; status post     radical cystectomy with ileal loop with nephrolithiasis. 4. Escherichia coli and Pseudomonas urinary tract infection. 5. Hypertension. 6. Status post cholecystectomy. 7. History of dementia. 8. Anemia, likely due to chronic disease. 9. History of recurrent urinary tract infections. 10.History of malignant hypertension.  DISCHARGE MEDICATIONS: 1. Albuterol 1 to 2 sprays inhaled every 6 hours as needed for     shortness of breath. 2. Levofloxacin 750 mg every other day for 5 more days. 3. Omeprazole 20 mg p.o. twice daily for 1 month then daily for 2     months. 4. Prednisone 20 mg p.o. q. day for 3 days, then 10 mg p.o. q. day for     3 days, then 5 mg p.o. q. day for 3 days, then discontinue. 5. Amlodipine 10 mg p.o. q.a.m. 6. Cranberry tablet over-the-counter 1 tablet p.o. q.a.m. 7. Calcium with vitamin D 1 tablet p.o. q. day. 8. Clonidine 0.2 mg p.o. twice daily. 9. Donepezil 5 mg p.o. q. day. 10.Fosamax 70 mg p.o. q. week. 11.Zofran 4 mg p.o. every 4 hours as needed for nausea. 12.Acetaminophen 1000 mg p.o. q. day.  BRIEF ADMITTING HISTORY AND PHYSICAL:  Ms. [name] is an 77 year old Caucasian female who presented on March 15, 2011 with complaints of nausea, vomiting, and shortness of  breath over the last 1 to 2 weeks.  RADIOLOGIST/IMAGING: 1. Patient had a chest x-ray, 2 views, which showed hyperinflation     consistent with COPD and/or emphysema.  No acute cardiopulmonary     disease.  Stable examination. 2. The patient had abdominal series on March 15, 2011 which showed     hyperaeration.  No active lung disease.  No bowel obstruction.  No     free air. 3. The patient had CT of the chest with contrast, which shows a     technically adequate exam showing no evidence of acute pulmonary     embolism or bronchiectatic changes.  Emphysema noted. 4. The patient had CT of the abdomen and pelvis with contrast, which     showed post cholecystectomy with urinary diversion, right lower     quadrant.  Mild bilateral hydronephrosis, hydroureter identified     greater on left.  Distal portion of left ureter is nondilated, not     visualized raising the question of urethral stricture as cause for     left hydronephrosis and left hydroureter.  Small calculi     dependently within the urinary diversion reservoir, decreased in  number since prior exam.  Increased intrahepatic and extrahepatic     dilatation since prior study.  LABS:  CBC shows a white count of 3.6; hemoglobin 10.2, initial hemoglobin on presentation 13.6; hematocrit 30.7; platelet count 188. Electrolytes normal with a BUN of 8, creatinine 1.08.  Liver function tests normal.  Troponins negative x3.  BNP is 910.2, TSH is 3.131.  UA is negative for nitrites and trace leukocytes.  Urine culture grew Pseudomonas aeruginosa that was pansensitive on March 15, 2011.  Urine culture on March 12, 2011 grew E coli that was again [place].  HOSPITAL COURSE BY PROBLEM: 1. Shortness of breath, likely due to reactive airway disease.     Etiology unclear, the patient was initially empirically started on     steroids, which were transitioned to oral during the course of the     hospital stay.  Uncertain if the  patient has COPD, however, the     patient does not have a smoking history.  The patient does report a     history of a secondhand smoking exposure.  Recommend that the     patient to have outpatient pulmonary function test to evaluate if     the patient truly has COPD at baseline.  We would not check     pulmonary function test at this time as the patient is having an     acute exacerbation.  The patient and family was instructed to     minimize any triggers such as dogs and cats.  The patient does     report that she has a dog at home.  Uncertain if the patient would     benefit from an outpatient pulmonologist or allergy/immunologist     for further evaluation.  The patient was also started on Levaquin,     which she will continue until March 25, 2011 to complete a 10-day     course of antibiotics.  The patient to complete steroid taper for 9     more days. 2. Nausea and vomiting, etiology unclear.  Initially thought that her     nausea vomiting may have been due to hydronephrosis.  As a result,     Urology was consulted.  Urology did not think that her     hydronephrosis was caused by nephrolithiasis rather was a chronic     change from radical cystectomy.  As a result, GI was consulted.     The patient had an EGD performed by Dr. Elnoria Howard on March 18, 2011;     which showed GERD changes and acute duodenitis.  Dr. Elnoria Howard     recommended b.i.d. PPI and to advance diet as tolerated.  During     the course of hospital stay, the patient's diet was improved and     was tolerating it well prior to discharge.  The patient had     intermittent diarrhea, for which she was given loperamide, diarrhea     resolved during the course of hospital stay. 3. Bilateral hydronephrosis, left greater than right; status post     radical cystectomy with ileal loop, with nephrolithiasis.  Urology     was consulted.  The patient will need outpatient follow up with Dr.     Brunilda Payor as outpatient. 4. E coli and  Pseudomonas urinary tract infection.  The patient will     complete a 10-day course of levofloxacin. 5. Hypertension, stable.  Continue the patient on home medications. 6. History of dementia, stable.  It was not an active issue during the     course of hospital stay. 7. Anemia, may be delusional, also may be due to chronic disease.     Hemoglobin stable during the course of hospital stay. 8. Deconditioning.  The patient was evaluated by Physical Therapy.  We     will arrange for home health physical therapy at the time of     discharge.  DISPOSITION/FOLLOW UP:  The patient was instructed to follow up with her primary care physician, Dr. Everlene Other in 1 week, to discuss with Dr. Everlene Other regarding outpatient pulmonary function test and also possible referral to Pulmonary and/or Allergy/Immunology.  The patient to follow with Dr. Brunilda Payor as outpatient in 1 to 2 weeks  Time spent on discharge talking to the patient, family, and coordinating care was 40 minutes.     Andreas Blower, MD     SR/MEDQ  D:  03/20/2011  T:  03/20/2011  Job:  [id number]  Electronically Signed by Wardell Heath Trampus Mcquerry  on 03/20/2011 09:20:28 PM

## 2011-10-13 ENCOUNTER — Emergency Department (HOSPITAL_COMMUNITY): Admit: 2011-10-13 | Payer: Medicare Other | Source: Home / Self Care

## 2011-10-13 ENCOUNTER — Encounter (HOSPITAL_COMMUNITY): Payer: Self-pay | Admitting: Emergency Medicine

## 2011-10-13 ENCOUNTER — Emergency Department (HOSPITAL_COMMUNITY)
Admission: EM | Admit: 2011-10-13 | Discharge: 2011-10-14 | Disposition: A | Discharge status: Home / Self Care | Payer: Medicare Other | Attending: Emergency Medicine | Admitting: Emergency Medicine

## 2011-10-13 DIAGNOSIS — L298 Other pruritus: Secondary | ICD-10-CM | POA: Insufficient documentation

## 2011-10-13 DIAGNOSIS — Z8551 Personal history of malignant neoplasm of bladder: Secondary | ICD-10-CM | POA: Insufficient documentation

## 2011-10-13 DIAGNOSIS — Z79899 Other long term (current) drug therapy: Secondary | ICD-10-CM | POA: Insufficient documentation

## 2011-10-13 DIAGNOSIS — M810 Age-related osteoporosis without current pathological fracture: Secondary | ICD-10-CM | POA: Insufficient documentation

## 2011-10-13 DIAGNOSIS — R1033 Periumbilical pain: Secondary | ICD-10-CM | POA: Insufficient documentation

## 2011-10-13 DIAGNOSIS — R112 Nausea with vomiting, unspecified: Secondary | ICD-10-CM | POA: Insufficient documentation

## 2011-10-13 DIAGNOSIS — L2989 Other pruritus: Secondary | ICD-10-CM | POA: Insufficient documentation

## 2011-10-13 DIAGNOSIS — I10 Essential (primary) hypertension: Secondary | ICD-10-CM | POA: Insufficient documentation

## 2011-10-13 DIAGNOSIS — R509 Fever, unspecified: Secondary | ICD-10-CM | POA: Insufficient documentation

## 2011-10-13 DIAGNOSIS — F0390 Unspecified dementia without behavioral disturbance: Secondary | ICD-10-CM | POA: Insufficient documentation

## 2011-10-13 HISTORY — DX: Essential (primary) hypertension: I10

## 2011-10-13 HISTORY — DX: Unspecified dementia, unspecified severity, without behavioral disturbance, psychotic disturbance, mood disturbance, and anxiety: F03.90

## 2011-10-13 HISTORY — DX: Calculus of kidney: N20.0

## 2011-10-13 HISTORY — DX: Malignant neoplasm of bladder, unspecified: C67.9

## 2011-10-13 LAB — URINALYSIS, ROUTINE W REFLEX MICROSCOPIC
Bilirubin Urine: NEGATIVE
Glucose, UA: NEGATIVE mg/dL
Hgb urine dipstick: NEGATIVE
Specific Gravity, Urine: 1.011 (ref 1.005–1.030)

## 2011-10-13 LAB — CBC
HCT: 33.1 % — ABNORMAL LOW (ref 36.0–46.0)
Hemoglobin: 11.3 g/dL — ABNORMAL LOW (ref 12.0–15.0)
MCH: 31.9 pg (ref 26.0–34.0)
MCHC: 34.1 g/dL (ref 30.0–36.0)
RBC: 3.54 MIL/uL — ABNORMAL LOW (ref 3.87–5.11)

## 2011-10-13 LAB — POCT I-STAT, CHEM 8
BUN: 22 mg/dL (ref 6–23)
Creatinine, Ser: 1.6 mg/dL — ABNORMAL HIGH (ref 0.50–1.10)
Glucose, Bld: 109 mg/dL — ABNORMAL HIGH (ref 70–99)
Hemoglobin: 12.2 g/dL (ref 12.0–15.0)
Potassium: 3.7 mEq/L (ref 3.5–5.1)
Sodium: 134 mEq/L — ABNORMAL LOW (ref 135–145)

## 2011-10-13 LAB — URINE MICROSCOPIC-ADD ON

## 2011-10-13 MED ORDER — ACETAMINOPHEN 325 MG PO TABS
650.0000 mg | ORAL_TABLET | Freq: Once | ORAL | Status: AC
  Administered 2011-10-13: 650 mg via ORAL
  Filled 2011-10-13: qty 2

## 2011-10-13 NOTE — ED Notes (Signed)
Obtained urine sample from urostomy stoma via 12F femcath using sterile technique. Pt changed her own urostomy bag. Urine sample sent to lab.

## 2011-10-13 NOTE — ED Notes (Signed)
PA Schulz at bedside. 

## 2011-10-13 NOTE — ED Notes (Signed)
Family states pt is c/o abd pain with nausea and vomiting today  Pt has been running a fever today  Pt has a urostomy bag and has hx of UTI so the family gave her an antibiotic, macrobid but states she threw that up  Pt was able to eat supper but vomited since  Pt's skin is flushed and pt is c/o itching

## 2011-10-13 NOTE — ED Notes (Signed)
[id number]<QI> Expected date:<BR> Expected time:<BR> Means of arrival:<BR> Comments:<BR> Pt in room

## 2011-10-13 NOTE — ED Notes (Signed)
Urine was out of urostomy bag.

## 2011-10-13 NOTE — ED Notes (Signed)
Pt denies pain and nausea at present. States her itching has stopped also. Pt resting quietly.

## 2011-10-13 NOTE — ED Provider Notes (Signed)
History     CSN: [id number]  Arrival date & time 10/13/11  1191   First MD Initiated Contact with Patient 10/13/11 2224      Chief Complaint  Patient presents with  . Abdominal Pain  . Fever    (Consider location/radiation/quality/duration/timing/severity/associated sxs/prior treatment) HPI Comments: Low grade fever vague abdominal pain and vomiting today  Family concerned for UTI as she has a urostomy she also had arm itching that has resolved   The history is provided by the patient and a caregiver.    Past Medical History  Diagnosis Date  . Bladder cancer   . Kidney stones   . Hypertension   . Dementia   . Osteoporosis     Past Surgical History  Procedure Date  . Bladder removed due to cancer    . Revision urostomy cutaneous   . Total shoulder arthroplasty   . Cholecystectomy   . Abdominal hysterectomy   . Appendectomy     History reviewed. No pertinent family history.  History  Substance Use Topics  . Smoking status: Not on file  . Smokeless tobacco: Not on file  . Alcohol Use: No    OB History    Grav Para Term Preterm Abortions TAB SAB Ect Mult Living                  Review of Systems  Constitutional: Positive for fever. Negative for chills.  Gastrointestinal: Positive for nausea, vomiting and abdominal pain. Negative for abdominal distention.  Skin: Negative for rash and wound.  Neurological: Negative for dizziness, weakness and numbness.    Allergies  Codeine and Metoclopramide  Home Medications   Current Outpatient Rx  Name Route Sig Dispense Refill  . ACETAMINOPHEN 325 MG PO TABS Oral Take 650 mg by mouth every 6 (six) hours as needed. PAIN    . ALENDRONATE SODIUM 70 MG PO TABS Oral Take 70 mg by mouth every 7 (seven) days. Take with a full glass of water on an empty stomach. Hold while in hospital.     . AMLODIPINE BESYLATE 5 MG PO TABS Oral Take 10 mg by mouth daily.      . BUDESONIDE-FORMOTEROL FUMARATE 80-4.5 MCG/ACT IN AERO  Inhalation Inhale 2 puffs into the lungs 2 (two) times daily.    Marland Kitchen CALCIUM CARBONATE-VITAMIN D 500-200 MG-UNIT PO TABS Oral Take 1 tablet by mouth daily.      Marland Kitchen CLONIDINE HCL 0.1 MG PO TABS Oral Take 0.2 mg by mouth 2 (two) times daily.      Marland Kitchen CRANBERRY 450 MG PO TABS Oral Take 1 tablet by mouth every morning.      . DONEPEZIL HCL 5 MG PO TABS Oral Take 5 mg by mouth daily.      Marland Kitchen NITROFURANTOIN MONOHYD MACRO 100 MG PO CAPS Oral Take 100 mg by mouth 2 (two) times daily.      Marland Kitchen ONDANSETRON HCL 4 MG PO TABS Oral Take 4 mg by mouth every 8 (eight) hours as needed. For nausea      . ONDANSETRON 4 MG PO TBDP Oral Take 1 tablet (4 mg total) by mouth every 8 (eight) hours as needed for nausea. 20 tablet 0    BP 138/53  Pulse 53  Temp(Src) 98.2 F (36.8 C) (Oral)  Resp 14  Ht 5\' 1"  (1.549 m)  Wt 106 lb (48.081 kg)  BMI 20.03 kg/m2  SpO2 97%  Physical Exam  Constitutional: She appears well-developed and well-nourished.  HENT:  Head: Normocephalic.  Eyes: Pupils are equal, round, and reactive to light.  Neck: Normal range of motion.  Cardiovascular: Normal rate.   Pulmonary/Chest: Effort normal.  Abdominal: Soft. Bowel sounds are normal. She exhibits no distension. There is no guarding.       Mild periumbilical and.  Stoma discomfort with deep palpation  Musculoskeletal: Normal range of motion.  Neurological: She is alert.  Skin: Skin is warm and dry. No rash noted.    ED Course  Procedures (including critical care time)  Labs Reviewed  CBC - Abnormal; Notable for the following:    RBC 3.54 (*)    Hemoglobin 11.3 (*)    HCT 33.1 (*)    All other components within normal limits  POCT I-STAT, CHEM 8 - Abnormal; Notable for the following:    Sodium 134 (*)    Creatinine, Ser 1.60 (*)    Glucose, Bld 109 (*)    Calcium, Ion 1.34 (*)    All other components within normal limits  URINALYSIS, ROUTINE W REFLEX MICROSCOPIC - Abnormal; Notable for the following:    Leukocytes, UA  SMALL (*)    All other components within normal limits  URINE MICROSCOPIC-ADD ON   Dg Abd Acute W/chest  10/14/2011  *RADIOLOGY REPORT*  Clinical Data: Abdominal pain and fever.  ACUTE ABDOMEN SERIES (ABDOMEN 2 VIEW & CHEST 1 VIEW)  Comparison: 03/15/2011  Findings: Normal heart size and pulmonary vascularity. Emphysematous changes and scattered fibrosis in the lungs.  Apical pleural thickening.  No focal airspace consolidation.  No blunting of costophrenic angles.  No pneumothorax.  Calcification of the aorta.  Postoperative changes in the right shoulder.  Surgical clips in the right upper quadrant.  Scattered gas and stool in the colon.  No large or small bowel distension.  No free intra-abdominal air.  No abnormal air fluid levels.  Degenerative changes in the spine.  Vascular calcifications projected over the spleen.  No significant change since previous study.  IMPRESSION: Emphysematous changes in the chest.  No evidence of active pulmonary disease.  Nonobstructive bowel gas pattern.  Original Report Authenticated By: Marlon Pel, M.D.     1. Nausea and vomiting   2. Fever     Patient is passed a fluid challenge without repeated, nausea, or vomiting.  Her fever is now within the normal range.  She ambulated in the hallway without difficulty.  She is no longer having any discomfort in her abdomen  MDM  nonspecific abdominal pain, around the stoma site        Arman Filter, NP 10/14/11 0158  Arman Filter, NP 10/14/11 1610

## 2011-10-14 ENCOUNTER — Emergency Department (HOSPITAL_COMMUNITY): Payer: Medicare Other

## 2011-10-14 MED ORDER — ONDANSETRON HCL 4 MG/2ML IJ SOLN: 4.0000 mg | Freq: Once | INTRAMUSCULAR | Status: DC

## 2011-10-14 MED ORDER — ONDANSETRON 4 MG PO TBDP: 4.0000 mg | ORAL_TABLET | Freq: Three times a day (TID) | ORAL | Status: AC | PRN

## 2011-10-14 MED ORDER — SODIUM CHLORIDE 0.9 % IV BOLUS (SEPSIS): 250.0000 mL | Freq: Once | INTRAVENOUS | Status: DC

## 2011-10-14 MED ORDER — ONDANSETRON 4 MG PO TBDP
4.0000 mg | ORAL_TABLET | Freq: Once | ORAL | Status: AC
  Administered 2011-10-14: 4 mg via ORAL
  Filled 2011-10-14: qty 1

## 2011-10-14 NOTE — Discharge Instructions (Signed)
Tonight there is no indication of any of the bacterial infection that would require antibiotics in mother's chest x-ray is clear.  Her white count is normal.  Her abdominal x-ray does not reveal, constipation, and her urine does not indicate infection at this time.  Please monitor her temperature.  The next several days.  You've also been given an additional prescription for Zofran.  This is a dissolvable tablet rather than what you have at home.  Please call her physician in the morning for followup

## 2011-10-14 NOTE — ED Notes (Signed)
Patient returned from X-ray 

## 2011-10-14 NOTE — ED Notes (Signed)
Pt given Ginger Ale for PO challenge. 

## 2011-10-14 NOTE — ED Notes (Signed)
PA Schulz at bedside. 

## 2011-10-15 NOTE — ED Provider Notes (Signed)
History     CSN: [id number]  Arrival date & time      None     No chief complaint on file.   (Consider location/radiation/quality/duration/timing/severity/associated sxs/prior treatment) HPI  Past Medical History  Diagnosis Date  . Bladder cancer   . Kidney stones   . Hypertension   . Dementia   . Osteoporosis     Past Surgical History  Procedure Date  . Bladder removed due to cancer    . Revision urostomy cutaneous   . Total shoulder arthroplasty   . Cholecystectomy   . Abdominal hysterectomy   . Appendectomy     No family history on file.  History  Substance Use Topics  . Smoking status: Not on file  . Smokeless tobacco: Not on file  . Alcohol Use: No    OB History    Grav Para Term Preterm Abortions TAB SAB Ect Mult Living                  Review of Systems  Allergies  Codeine and Metoclopramide  Home Medications   Current Outpatient Rx  Name Route Sig Dispense Refill  . ACETAMINOPHEN 325 MG PO TABS Oral Take 650 mg by mouth every 6 (six) hours as needed. PAIN    . ALENDRONATE SODIUM 70 MG PO TABS Oral Take 70 mg by mouth every 7 (seven) days. Take with a full glass of water on an empty stomach. Hold while in hospital.     . AMLODIPINE BESYLATE 5 MG PO TABS Oral Take 10 mg by mouth daily.      . BUDESONIDE-FORMOTEROL FUMARATE 80-4.5 MCG/ACT IN AERO Inhalation Inhale 2 puffs into the lungs 2 (two) times daily.    Marland Kitchen CALCIUM CARBONATE-VITAMIN D 500-200 MG-UNIT PO TABS Oral Take 1 tablet by mouth daily.      Marland Kitchen CLONIDINE HCL 0.1 MG PO TABS Oral Take 0.2 mg by mouth 2 (two) times daily.      Marland Kitchen CRANBERRY 450 MG PO TABS Oral Take 1 tablet by mouth every morning.      . DONEPEZIL HCL 5 MG PO TABS Oral Take 5 mg by mouth daily.      Marland Kitchen NITROFURANTOIN MONOHYD MACRO 100 MG PO CAPS Oral Take 100 mg by mouth 2 (two) times daily.      Marland Kitchen ONDANSETRON HCL 4 MG PO TABS Oral Take 4 mg by mouth every 8 (eight) hours as needed. For nausea      . ONDANSETRON 4 MG PO  TBDP Oral Take 1 tablet (4 mg total) by mouth every 8 (eight) hours as needed for nausea. 20 tablet 0    There were no vitals taken for this visit.  Physical Exam  ED Course  Procedures (including critical care time)  Labs Reviewed - No data to display Dg Abd Acute W/chest  10/14/2011  *RADIOLOGY REPORT*  Clinical Data: Abdominal pain and fever.  ACUTE ABDOMEN SERIES (ABDOMEN 2 VIEW & CHEST 1 VIEW)  Comparison: 03/15/2011  Findings: Normal heart size and pulmonary vascularity. Emphysematous changes and scattered fibrosis in the lungs.  Apical pleural thickening.  No focal airspace consolidation.  No blunting of costophrenic angles.  No pneumothorax.  Calcification of the aorta.  Postoperative changes in the right shoulder.  Surgical clips in the right upper quadrant.  Scattered gas and stool in the colon.  No large or small bowel distension.  No free intra-abdominal air.  No abnormal air fluid levels.  Degenerative changes in the spine.  Vascular calcifications projected over the spleen.  No significant change since previous study.  IMPRESSION: Emphysematous changes in the chest.  No evidence of active pulmonary disease.  Nonobstructive bowel gas pattern.  Original Report Authenticated By: Marlon Pel, M.D.     No diagnosis found.    MDM  UTI        Arman Filter, NP 10/15/11 1958

## 2011-10-20 NOTE — ED Provider Notes (Signed)
Medical screening examination/treatment/procedure(s) were performed by non-physician practitioner and as supervising physician I was immediately available for consultation/collaboration.  Marlan Steward, MD 10/20/11 2300 

## 2011-10-20 NOTE — ED Provider Notes (Signed)
Medical screening examination/treatment/procedure(s) were performed by non-physician practitioner and as supervising physician I was immediately available for consultation/collaboration.  Baldwin Racicot, MD 10/20/11 2301 

## 2012-05-10 ENCOUNTER — Emergency Department (HOSPITAL_COMMUNITY): Payer: Medicare Other

## 2012-05-10 ENCOUNTER — Inpatient Hospital Stay (HOSPITAL_COMMUNITY)
Admission: EM | Admit: 2012-05-10 | Discharge: 2012-05-14 | DRG: 690 | Disposition: A | Discharge status: Home / Self Care | Payer: Medicare Other | Attending: Internal Medicine | Admitting: Internal Medicine

## 2012-05-10 ENCOUNTER — Encounter (HOSPITAL_COMMUNITY): Payer: Self-pay | Admitting: *Deleted

## 2012-05-10 DIAGNOSIS — R630 Anorexia: Secondary | ICD-10-CM

## 2012-05-10 DIAGNOSIS — B961 Klebsiella pneumoniae [K. pneumoniae] as the cause of diseases classified elsewhere: Secondary | ICD-10-CM | POA: Diagnosis present

## 2012-05-10 DIAGNOSIS — Z906 Acquired absence of other parts of urinary tract: Secondary | ICD-10-CM

## 2012-05-10 DIAGNOSIS — R509 Fever, unspecified: Secondary | ICD-10-CM

## 2012-05-10 DIAGNOSIS — Z938 Other artificial opening status: Secondary | ICD-10-CM

## 2012-05-10 DIAGNOSIS — E876 Hypokalemia: Secondary | ICD-10-CM

## 2012-05-10 DIAGNOSIS — I10 Essential (primary) hypertension: Secondary | ICD-10-CM

## 2012-05-10 DIAGNOSIS — R110 Nausea: Secondary | ICD-10-CM

## 2012-05-10 DIAGNOSIS — Z8551 Personal history of malignant neoplasm of bladder: Secondary | ICD-10-CM

## 2012-05-10 DIAGNOSIS — N179 Acute kidney failure, unspecified: Secondary | ICD-10-CM

## 2012-05-10 DIAGNOSIS — N390 Urinary tract infection, site not specified: Secondary | ICD-10-CM

## 2012-05-10 DIAGNOSIS — Z87440 Personal history of urinary (tract) infections: Secondary | ICD-10-CM

## 2012-05-10 DIAGNOSIS — B372 Candidiasis of skin and nail: Secondary | ICD-10-CM

## 2012-05-10 DIAGNOSIS — R197 Diarrhea, unspecified: Secondary | ICD-10-CM

## 2012-05-10 DIAGNOSIS — N12 Tubulo-interstitial nephritis, not specified as acute or chronic: Principal | ICD-10-CM

## 2012-05-10 HISTORY — DX: Urinary tract infection, site not specified: N39.0

## 2012-05-10 LAB — BASIC METABOLIC PANEL
BUN: 25 mg/dL — ABNORMAL HIGH (ref 6–23)
CO2: 21 mEq/L (ref 19–32)
Calcium: 9.4 mg/dL (ref 8.4–10.5)
Chloride: 103 mEq/L (ref 96–112)
Creatinine, Ser: 1.8 mg/dL — ABNORMAL HIGH (ref 0.50–1.10)
GFR calc Af Amer: 29 mL/min — ABNORMAL LOW (ref >90)
GFR calc non Af Amer: 25 mL/min — ABNORMAL LOW (ref >90)
Glucose, Bld: 99 mg/dL (ref 70–99)
Potassium: 4.2 mEq/L (ref 3.5–5.1)
Sodium: 134 mEq/L — ABNORMAL LOW (ref 135–145)

## 2012-05-10 LAB — URINALYSIS, ROUTINE W REFLEX MICROSCOPIC
Bilirubin Urine: NEGATIVE
Glucose, UA: NEGATIVE mg/dL
Ketones, ur: NEGATIVE mg/dL
Nitrite: POSITIVE — AB
Protein, ur: NEGATIVE mg/dL
Specific Gravity, Urine: 1.011 (ref 1.005–1.030)
Urobilinogen, UA: 0.2 mg/dL (ref 0.0–1.0)
pH: 6.5 (ref 5.0–8.0)

## 2012-05-10 LAB — CBC
HCT: 34.7 % — ABNORMAL LOW (ref 36.0–46.0)
Hemoglobin: 11.9 g/dL — ABNORMAL LOW (ref 12.0–15.0)
MCH: 31.2 pg (ref 26.0–34.0)
MCHC: 34.3 g/dL (ref 30.0–36.0)
MCV: 91.1 fL (ref 78.0–100.0)
Platelets: 196 10*3/uL (ref 150–400)
RBC: 3.81 MIL/uL — ABNORMAL LOW (ref 3.87–5.11)
RDW: 13.3 % (ref 11.5–15.5)
WBC: 8.6 10*3/uL (ref 4.0–10.5)

## 2012-05-10 LAB — URINE MICROSCOPIC-ADD ON

## 2012-05-10 LAB — LACTIC ACID, PLASMA: Lactic Acid, Venous: 1.6 mmol/L (ref 0.5–2.2)

## 2012-05-10 MED ORDER — ONDANSETRON HCL 4 MG PO TABS: 4.0000 mg | ORAL_TABLET | Freq: Three times a day (TID) | ORAL | Status: DC | PRN

## 2012-05-10 MED ORDER — BUDESONIDE-FORMOTEROL FUMARATE 80-4.5 MCG/ACT IN AERO
2.0000 | INHALATION_SPRAY | Freq: Two times a day (BID) | RESPIRATORY_TRACT | Status: DC
  Administered 2012-05-11 – 2012-05-14 (×7): 2 via RESPIRATORY_TRACT
  Filled 2012-05-10: qty 6.9

## 2012-05-10 MED ORDER — DEXTROSE 5 % IV SOLN
1.0000 g | Freq: Once | INTRAVENOUS | Status: AC
  Administered 2012-05-10: 1 g via INTRAVENOUS
  Filled 2012-05-10: qty 10

## 2012-05-10 MED ORDER — ACETAMINOPHEN 650 MG RE SUPP: 650.0000 mg | Freq: Four times a day (QID) | RECTAL | Status: DC | PRN

## 2012-05-10 MED ORDER — CIPROFLOXACIN HCL 500 MG PO TABS: 500.0000 mg | ORAL_TABLET | Freq: Two times a day (BID) | ORAL | Status: DC

## 2012-05-10 MED ORDER — ONDANSETRON HCL 4 MG/2ML IJ SOLN
4.0000 mg | Freq: Once | INTRAMUSCULAR | Status: AC
  Administered 2012-05-10: 4 mg via INTRAVENOUS
  Filled 2012-05-10: qty 2

## 2012-05-10 MED ORDER — SODIUM CHLORIDE 0.9 % IV BOLUS (SEPSIS)
1000.0000 mL | Freq: Once | INTRAVENOUS | Status: AC
  Administered 2012-05-10: 1000 mL via INTRAVENOUS

## 2012-05-10 MED ORDER — DONEPEZIL HCL 5 MG PO TABS
5.0000 mg | ORAL_TABLET | Freq: Every day | ORAL | Status: DC
  Administered 2012-05-10 – 2012-05-13 (×4): 5 mg via ORAL
  Filled 2012-05-10 (×5): qty 1

## 2012-05-10 MED ORDER — DEXTROSE 5 % IV SOLN
1.0000 g | INTRAVENOUS | Status: DC
  Administered 2012-05-10 – 2012-05-11 (×2): 1 g via INTRAVENOUS
  Filled 2012-05-10 (×3): qty 1

## 2012-05-10 MED ORDER — ACETAMINOPHEN 325 MG PO TABS
650.0000 mg | ORAL_TABLET | Freq: Four times a day (QID) | ORAL | Status: DC | PRN
  Administered 2012-05-10 – 2012-05-12 (×3): 650 mg via ORAL
  Filled 2012-05-10 (×3): qty 2

## 2012-05-10 MED ORDER — ONDANSETRON HCL 4 MG/2ML IJ SOLN: 4.0000 mg | Freq: Three times a day (TID) | INTRAMUSCULAR | Status: DC | PRN

## 2012-05-10 MED ORDER — ONDANSETRON HCL 4 MG PO TABS
4.0000 mg | ORAL_TABLET | Freq: Four times a day (QID) | ORAL | Status: DC | PRN
  Administered 2012-05-11: 4 mg via ORAL
  Filled 2012-05-10: qty 1

## 2012-05-10 MED ORDER — ENOXAPARIN SODIUM 30 MG/0.3ML ~~LOC~~ SOLN
30.0000 mg | SUBCUTANEOUS | Status: DC
  Administered 2012-05-10: 30 mg via SUBCUTANEOUS
  Filled 2012-05-10 (×2): qty 0.3

## 2012-05-10 MED ORDER — CALCIUM CARBONATE-VITAMIN D 500-200 MG-UNIT PO TABS
1.0000 | ORAL_TABLET | Freq: Every day | ORAL | Status: DC
  Administered 2012-05-10 – 2012-05-13 (×3): 1 via ORAL
  Filled 2012-05-10 (×5): qty 1

## 2012-05-10 MED ORDER — ACETAMINOPHEN 325 MG PO TABS
650.0000 mg | ORAL_TABLET | Freq: Once | ORAL | Status: AC
  Administered 2012-05-10: 650 mg via ORAL
  Filled 2012-05-10: qty 2

## 2012-05-10 MED ORDER — IBUPROFEN 400 MG PO TABS
400.0000 mg | ORAL_TABLET | Freq: Once | ORAL | Status: AC
  Administered 2012-05-10: 400 mg via ORAL
  Filled 2012-05-10: qty 1

## 2012-05-10 MED ORDER — ONDANSETRON HCL 4 MG/2ML IJ SOLN
4.0000 mg | Freq: Four times a day (QID) | INTRAMUSCULAR | Status: DC | PRN
  Administered 2012-05-12 – 2012-05-13 (×3): 4 mg via INTRAVENOUS
  Filled 2012-05-10 (×4): qty 2

## 2012-05-10 MED ORDER — IBUPROFEN 800 MG PO TABS: 400.0000 mg | ORAL_TABLET | Freq: Four times a day (QID) | ORAL | Status: DC | PRN

## 2012-05-10 MED ORDER — SODIUM CHLORIDE 0.9 % IV SOLN
INTRAVENOUS | Status: DC
  Administered 2012-05-10: 1000 mL via INTRAVENOUS
  Administered 2012-05-11 – 2012-05-12 (×4): via INTRAVENOUS

## 2012-05-10 NOTE — ED Notes (Addendum)
Pt family reports pt had 102 oral fever, given 500mg  tylenol at 0800. C/o "swimmy headed, nausea, sob". Pt denies pain. At present pt denies SOB. Pt hx of bladder cancer, had bladder removed and now has right sided urostomy bag. Pt has hx of getting infections and has been hospitalized in past for infections.   At baseline pt is up cleaning the house, being active, family reports this is not pts baseline.

## 2012-05-10 NOTE — ED Provider Notes (Signed)
History    78 year old female with fever, fatigue and nausea. Onset earlier this morning. Patient has a history of bladder cancer. Status post cystectomy. Has a right-sided urostomy. Followed by Dr Brunilda Payor. Urinary tract infection in the past with similar symptoms. Patient denies any abdominal pain. No vomiting or diarrhea. No sick contacts. No chest pain or shortness of breath. Patient very active at baseline. Talkative and conversive. Ambulatory and active.  CSN: [id number]  Arrival date & time 05/10/12  4098   First MD Initiated Contact with Patient 05/10/12 1002      Chief Complaint  Patient presents with  . Fever  . Dizziness    (Consider location/radiation/quality/duration/timing/severity/associated sxs/prior treatment) HPI  Past Medical History  Diagnosis Date  . Bladder cancer   . Kidney stones   . Hypertension   . Dementia   . Osteoporosis   . UTI (lower urinary tract infection)     Past Surgical History  Procedure Date  . Bladder removed due to cancer    . Revision urostomy cutaneous   . Total shoulder arthroplasty   . Cholecystectomy   . Abdominal hysterectomy   . Appendectomy     History reviewed. No pertinent family history.  History  Substance Use Topics  . Smoking status: Not on file  . Smokeless tobacco: Not on file  . Alcohol Use: No    OB History    Grav Para Term Preterm Abortions TAB SAB Ect Mult Living                  Review of Systems  All systems reviewed and negative, other than as noted in HPI.   Allergies  Codeine and Metoclopramide  Home Medications   Current Outpatient Rx  Name  Route  Sig  Dispense  Refill  . ACETAMINOPHEN 325 MG PO TABS   Oral   Take 650 mg by mouth every 6 (six) hours as needed. PAIN         . ALENDRONATE SODIUM 70 MG PO TABS   Oral   Take 70 mg by mouth every Sunday. Take with a full glass of water on an empty stomach. Hold while in hospital.         . AMLODIPINE BESYLATE 5 MG PO TABS    Oral   Take 10 mg by mouth daily.           . BUDESONIDE-FORMOTEROL FUMARATE 80-4.5 MCG/ACT IN AERO   Inhalation   Inhale 2 puffs into the lungs 2 (two) times daily.         Marland Kitchen CALCIUM CARBONATE-VITAMIN D 500-200 MG-UNIT PO TABS   Oral   Take 1 tablet by mouth daily.           Marland Kitchen CLONIDINE HCL 0.1 MG PO TABS   Oral   Take 0.2 mg by mouth 2 (two) times daily.           Marland Kitchen CRANBERRY 450 MG PO TABS   Oral   Take 1 tablet by mouth every morning.           . DONEPEZIL HCL 5 MG PO TABS   Oral   Take 5 mg by mouth daily.           Marland Kitchen LOPERAMIDE HCL 2 MG PO CAPS   Oral   Take 2 mg by mouth 4 (four) times daily as needed.         Marland Kitchen ONDANSETRON HCL 4 MG PO TABS   Oral   Take  4 mg by mouth every 8 (eight) hours as needed. For nausea           . CIPROFLOXACIN HCL 500 MG PO TABS   Oral   Take 1 tablet (500 mg total) by mouth every 12 (twelve) hours.   10 tablet   0     BP 105/46  Pulse 84  Temp 100.1 F (37.8 C) (Oral)  Resp 16  SpO2 97%  Physical Exam  Nursing note and vitals reviewed. Constitutional: She appears well-developed and well-nourished. No distress.       Laying in bed. Tired appearing, but not toxic.  HENT:  Head: Normocephalic and atraumatic.  Eyes: Conjunctivae normal are normal. Right eye exhibits no discharge. Left eye exhibits no discharge.  Neck: Neck supple.  Cardiovascular: Normal rate, regular rhythm and normal heart sounds.  Exam reveals no gallop and no friction rub.   No murmur heard. Pulmonary/Chest: Effort normal and breath sounds normal. No respiratory distress.  Abdominal: Soft. She exhibits no distension. There is no tenderness.       Right-sided urostomy. No abdominal tenderness. No distention  Musculoskeletal: She exhibits no edema and no tenderness.  Neurological: She is alert.  Skin: Skin is warm and dry.  Psychiatric: She has a normal mood and affect. Her behavior is normal. Thought content normal.    ED Course   Procedures (including critical care time)  Labs Reviewed  CBC - Abnormal; Notable for the following:    RBC 3.81 (*)     Hemoglobin 11.9 (*)     HCT 34.7 (*)     All other components within normal limits  BASIC METABOLIC PANEL - Abnormal; Notable for the following:    Sodium 134 (*)     BUN 25 (*)     Creatinine, Ser 1.80 (*)     GFR calc non Af Amer 25 (*)     GFR calc Af Amer 29 (*)     All other components within normal limits  URINALYSIS, ROUTINE W REFLEX MICROSCOPIC - Abnormal; Notable for the following:    APPearance CLOUDY (*)     Hgb urine dipstick TRACE (*)     Nitrite POSITIVE (*)     Leukocytes, UA LARGE (*)     All other components within normal limits  URINE MICROSCOPIC-ADD ON - Abnormal; Notable for the following:    Bacteria, UA MANY (*)     All other components within normal limits  URINE CULTURE  LACTIC ACID, PLASMA   Dg Chest 2 View  05/10/2012  *RADIOLOGY REPORT*  Clinical Data: Fever  CHEST - 2 VIEW  Comparison: 03/15/2011  Findings: Hyperinflation again noted.  Stable bilateral apical pleuroparenchymal scarring.  No acute infiltrate or pulmonary edema.  Atherosclerotic calcifications of thoracic aorta again noted.  Right humeral prosthesis.  IMPRESSION: No active disease.  Hyperinflation again noted.   Original Report Authenticated By: Natasha Mead, M.D.      1. UTI (urinary tract infection)   2. Fever   3. Renal Insufficiency    MDM  78 year old female with fever and fatigue. Urinalysis is consistent with UTI. Given fever and abnormal anatomy secondary to procedures from bladder cancer, will admit for treatment and further evaluation.         Raeford Razor, MD 05/10/12 1336

## 2012-05-10 NOTE — Progress Notes (Signed)
Pt arrived to room with family. She is alert and oriented. Temp 103.7 Tylenol 650 mg given po, temp down to 102.3. Pt. Denies any pain. Urostomy draining cloudy yellow urine. Pt encouraged to drink fluids Restictive band on Rt. Arm. Blood cultures drawn x2

## 2012-05-10 NOTE — H&P (Addendum)
[facility] History and Physical  [name] WUJ:[id number] DOB: [birth year] DOA: 05/10/2012  Referring physician: Dr Juleen China. PCP: Aura Dials, MD  Specialists: Dr Brunilda Payor, Urologist.  Chief Complaint: Fever.  HPI: [name] is a 78 y.o. female with PMH significant for bladder cancer s/p Cystectomy , s/p urostomy, who present with fever, fatigue and nausea. Patient has had fever on and off for last 3 to 4 days. She has decrease oral intake over last 2 days. She denies headache, neck pain, generalized muscle pain, cough, runny nose, sore throat, abdominal pain. No problems with urostomy. She had 3 to 4 loose stool day prior to admission. She usually gets diarrhea when she is under stress. She took imodium day prior to admission.   Review of Systems: The patient denies  vision loss, decreased hearing, hoarseness, chest pain, syncope, dyspnea on exertion, peripheral edema, balance deficits, hemoptysis, abdominal pain, melena, hematochezia, severe indigestion/heartburn, hematuria, incontinence, genital sores, muscle weakness, suspicious skin lesions, transient blindness, difficulty walking, depression, unusual weight change, abnormal bleeding, enlarged lymph nodes, angioedema, .    Past Medical History  Diagnosis Date  . Bladder cancer   . Kidney stones   . Hypertension   . Dementia   . Osteoporosis   . UTI (lower urinary tract infection)    Past Surgical History  Procedure Date  . Bladder removed due to cancer    . Revision urostomy cutaneous   . Total shoulder arthroplasty   . Cholecystectomy   . Abdominal hysterectomy   . Appendectomy    Social History:  reports that she has never smoked. She has never used smokeless tobacco. She reports that she does not drink alcohol or use illicit drugs. She lives with daughter.   Allergies  Allergen Reactions  . Codeine Nausea And Vomiting  . Metoclopramide Other (See Comments)    Makes patient "climb the walls"     Family  History  Problem Relation Age of Onset  . Other Mother Deceased.  Marland Kitchen Heart attack Father Deceased.    Prior to Admission medications   Medication Sig Start Date End Date Taking? Authorizing Provider  acetaminophen (TYLENOL) 325 MG tablet Take 650 mg by mouth every 6 (six) hours as needed. PAIN   Yes Historical Provider, MD  alendronate (FOSAMAX) 70 MG tablet Take 70 mg by mouth every Sunday. Take with a full glass of water on an empty stomach. Hold while in hospital.   Yes Historical Provider, MD  amLODipine (NORVASC) 5 MG tablet Take 10 mg by mouth daily.     Yes Historical Provider, MD  budesonide-formoterol (SYMBICORT) 80-4.5 MCG/ACT inhaler Inhale 2 puffs into the lungs 2 (two) times daily.   Yes Historical Provider, MD  calcium-vitamin D (OSCAL WITH D) 500-200 MG-UNIT per tablet Take 1 tablet by mouth daily.     Yes Historical Provider, MD  cloNIDine (CATAPRES) 0.1 MG tablet Take 0.2 mg by mouth 2 (two) times daily.     Yes Historical Provider, MD  Cranberry (AZO-CRANBERRY) 450 MG TABS Take 1 tablet by mouth every morning.     Yes Historical Provider, MD  donepezil (ARICEPT) 5 MG tablet Take 5 mg by mouth daily.     Yes Historical Provider, MD  loperamide (IMODIUM) 2 MG capsule Take 2 mg by mouth 4 (four) times daily as needed.   Yes Historical Provider, MD  ondansetron (ZOFRAN) 4 MG tablet Take 4 mg by mouth every 8 (eight) hours as needed. For nausea     Yes  Historical Provider, MD  ciprofloxacin (CIPRO) 500 MG tablet Take 1 tablet (500 mg total) by mouth every 12 (twelve) hours. 05/10/12   Raeford Razor, MD   Physical Exam: Filed Vitals:   05/10/12 0934 05/10/12 1255 05/10/12 1447 05/10/12 1525  BP: 105/46  104/61 129/46  Pulse: 84  83 81  Temp: 100 F (37.8 C) 100.1 F (37.8 C) 102.1 F (38.9 C) 102.2 F (39 C)  TempSrc: Oral Oral Oral Oral  Resp: 16  16 18   SpO2: 97%  99% 98%    General Appearance:    Alert, cooperative, no distress, appears stated age  Head:     Normocephalic, without obvious abnormality, atraumatic  Eyes:    PERRL, conjunctiva/corneas clear, EOM's intact,     Ears:    Normal TM's and external ear canals, both ears  Nose:   Nares normal, septum midline, mucosa normal, no drainage    or sinus tenderness  Throat:   Lips, mucosa, and tongue dry;   Neck:   Supple, symmetrical, trachea midline, no adenopathy;    thyroid:  no enlargement/tenderness/nodules; no carotid   bruit or JVD  Back:     Symmetric, no curvature, ROM normal, no CVA tenderness  Lungs:     Clear to auscultation bilaterally, respirations unlabored  Chest Wall:    No tenderness or deformity   Heart:    Regular rate and rhythm, S1 and S2 normal, no murmur, rub   or gallop     Abdomen:     Soft, non-tender, bowel sounds active all four quadrants,    no masses, no organomegaly, urostomy in place.        Extremities:   Extremities normal, atraumatic, no cyanosis or edema  Pulses:   2+ and symmetric all extremities  Skin:   Skin color, texture, turgor normal, no rashes or lesions  Lymph nodes:   Cervical, supraclavicular, and axillary nodes normal  Neurologic:   CNII-XII intact, normal strength, sensation and reflexes    throughout    Labs on Admission:  Basic Metabolic Panel:  Lab 05/10/12 2956  NA 134*  K 4.2  CL 103  CO2 21  GLUCOSE 99  BUN 25*  CREATININE 1.80*  CALCIUM 9.4  MG --  PHOS --   CBC:  Lab 05/10/12 1055  WBC 8.6  NEUTROABS --  HGB 11.9*  HCT 34.7*  MCV 91.1  PLT 196   Cardiac Enzymes: No results found for this basename: CKTOTAL:5,CKMB:5,CKMBINDEX:5,TROPONINI:5 in the last 168 hours  BNP (last 3 results) No results found for this basename: PROBNP:3 in the last 8760 hours CBG: No results found for this basename: GLUCAP:5 in the last 168 hours  Radiological Exams on Admission: Dg Chest 2 View  05/10/2012  *RADIOLOGY REPORT*  Clinical Data: Fever  CHEST - 2 VIEW  Comparison: 03/15/2011  Findings: Hyperinflation again noted.   Stable bilateral apical pleuroparenchymal scarring.  No acute infiltrate or pulmonary edema.  Atherosclerotic calcifications of thoracic aorta again noted.  Right humeral prosthesis.  IMPRESSION: No active disease.  Hyperinflation again noted.   Original Report Authenticated By: Natasha Mead, M.D.     Assessment/Plan Principal Problem:  *UTI (urinary tract infection) Active Problems:  Fever  Acute renal failure  1-UTI: Patient presents with fever at 102, UA with too numerous to count WBC, positive nitrates. She received dose of ceftriaxone in the ED. I reviewed prior urine culture data and she had Pseudomonas sensitive to Cefepime. I will start Cefepime. Will need to follow  urine culture result. I will order blood culture. Lactic acid at 1.6.  2-Fever: likely secondary to UTI. No others focal sign of infection. Chest x ray negative. Blood culture. Tylenol PRN. 3-Acute renal failure: Likely pre renal in setting decrease volume and UTI. IV fluids. Repeat B-me am. Strict I and O.If no improvement will consider renal US.  4-Hypertension: I will hold BP medication, BP fluctuates 100 to 129. If BP increase can restart Norvasc or clonidine.  5-Diarrhea: She has history of diarrhea (IBS). No Bowel movement today. If diarrhea reoccurs will check for C diff.   Family Communication: Daughter at bedside, plan of care discussed with her.  Disposition Plan: Admit for IV antibiotics. Expect 3 to 4 days in hospital.  Time spent: 70 minutes.  Larsen Zettel [facility] Pager [phone]  If 7PM-7AM, please contact night-coverage [email or web address] Password [facility] 05/10/2012, 3:37 PM

## 2012-05-11 DIAGNOSIS — R197 Diarrhea, unspecified: Secondary | ICD-10-CM | POA: Diagnosis present

## 2012-05-11 DIAGNOSIS — I10 Essential (primary) hypertension: Secondary | ICD-10-CM | POA: Diagnosis present

## 2012-05-11 DIAGNOSIS — R630 Anorexia: Secondary | ICD-10-CM | POA: Diagnosis present

## 2012-05-11 DIAGNOSIS — R110 Nausea: Secondary | ICD-10-CM | POA: Diagnosis present

## 2012-05-11 LAB — CBC
MCH: 31.1 pg (ref 26.0–34.0)
MCV: 93 fL (ref 78.0–100.0)
Platelets: 174 10*3/uL (ref 150–400)
RBC: 3.44 MIL/uL — ABNORMAL LOW (ref 3.87–5.11)

## 2012-05-11 LAB — URINE CULTURE: Colony Count: >=100000

## 2012-05-11 LAB — COMPREHENSIVE METABOLIC PANEL
ALT: 18 U/L (ref 0–35)
AST: 34 U/L (ref 0–37)
Albumin: 2.7 g/dL — ABNORMAL LOW (ref 3.5–5.2)
Chloride: 109 mEq/L (ref 96–112)
Creatinine, Ser: 1.41 mg/dL — ABNORMAL HIGH (ref 0.50–1.10)
Potassium: 4.1 mEq/L (ref 3.5–5.1)
Sodium: 137 mEq/L (ref 135–145)
Total Bilirubin: 0.2 mg/dL — ABNORMAL LOW (ref 0.3–1.2)

## 2012-05-11 MED ORDER — PROMETHAZINE HCL 25 MG/ML IJ SOLN
12.5000 mg | INTRAMUSCULAR | Status: DC | PRN
  Administered 2012-05-11: 12.5 mg via INTRAVENOUS
  Filled 2012-05-11: qty 1

## 2012-05-11 NOTE — Progress Notes (Signed)
[facility] PROGRESS NOTE  [name] ZOX:[id number] DOB: Jun 10, 1927 DOA: 05/10/2012 PCP: Aura Dials, MD Urologist: Dr. Brunilda Payor  Brief narrative: [name] is an 78 year old female with a past medical history of bladder cancer status post cystectomy and chronic urostomy along with recurrent problems with urinary tract infection with a history of Pseudomonas infection who was admitted with fever and recurrent urinary tract infection on 05/10/2012. She was put on empiric Cipro and cefepime to cover Pseudomonas while awaiting urine culture results.  Assessment/Plan: Principal Problem:  *UTI (urinary tract infection)  Continue cefepime and Cipro pending urinary culture results. Preliminary cultures growing gram-negative rods. Active Problems:  Fever  Antipyretics as needed.  Acute renal failure  Prerenal in etiology given poor oral intake. Continue IV fluids. Creatinine improved over the past 24 hours.  Hypertension  Blood pressure medication on hold.  Diarrhea  Chronic, secondary to history of irritable bowel syndrome.  Nausea  Anti-nausea medications as needed. We'll change to parenteral route.  Anorexia  Diet as tolerated.  Code Status: Full Family Communication: Updated at bedside. Disposition Plan: Home when stable.   Medical Consultants:  None.  Other Consultants:  None.  Anti-infectives:  Cipro 05/10/2012--->  Cefepime 05/10/2012--->  Rocephin 05/10/2012---> 05/10/2012  HPI/Subjective: [name] feels unwell. She has persistent nausea and poor oral intake. No vomiting. Has chronic diarrhea related to her irritable bowel syndrome but reports that she has moved her bowels twice today and the movements were semi-formed.  Objective: Filed Vitals:   05/11/12 0547 05/11/12 0552 05/11/12 0944 05/11/12 1327  BP: 131/47 120/50    Pulse: 62     Temp: 98.3 F (36.8 C)   102.2 F (39 C)  TempSrc: Oral   Oral  Resp:      Height:      Weight:       SpO2: 98%  96%     Intake/Output Summary (Last 24 hours) at 05/11/12 1359 Last data filed at 05/11/12 0800  Gross per 24 hour  Intake 1661.67 ml  Output    800 ml  Net 861.67 ml    Exam: Gen:  NAD Cardiovascular:  RRR, No M/R/G Respiratory:  Lungs CTAB Gastrointestinal:  Abdomen soft, NT/ND, + BS Extremities:  Trace edema  Data Reviewed: Basic Metabolic Panel:  Lab 05/11/12 8119 05/10/12 1055  NA 137 134*  K 4.1 4.2  CL 109 103  CO2 17* 21  GLUCOSE 82 99  BUN 21 25*  CREATININE 1.41* 1.80*  CALCIUM 8.2* 9.4  MG -- --  PHOS -- --   GFR Estimated Creatinine Clearance: 23.4 ml/min (by C-G formula based on Cr of 1.41). Liver Function Tests:  Lab 05/11/12 0400  AST 34  ALT 18  ALKPHOS 80  BILITOT 0.2*  PROT 6.4  ALBUMIN 2.7*   CBC:  Lab 05/11/12 0400 05/10/12 1055  WBC 12.4* 8.6  NEUTROABS -- --  HGB 10.7* 11.9*  HCT 32.0* 34.7*  MCV 93.0 91.1  PLT 174 196   Microbiology Recent Results (from the past 240 hour(s))  URINE CULTURE     Status: Normal (Preliminary result)   Collection Time   05/10/12 11:24 AM      Component Value Range Status Comment   Specimen Description URINE, CLEAN CATCH   Final    Special Requests NONE   Final    Culture  Setup Time 05/10/2012 16:08   Final    Colony Count >=100,000 COLONIES/ML   Final    Culture GRAM NEGATIVE RODS  Final    Report Status PENDING   Incomplete      Procedures and Diagnostic Studies: Dg Chest 2 View  05/10/2012  *RADIOLOGY REPORT*  Clinical Data: Fever  CHEST - 2 VIEW  Comparison: 03/15/2011  Findings: Hyperinflation again noted.  Stable bilateral apical pleuroparenchymal scarring.  No acute infiltrate or pulmonary edema.  Atherosclerotic calcifications of thoracic aorta again noted.  Right humeral prosthesis.  IMPRESSION: No active disease.  Hyperinflation again noted.   Original Report Authenticated By: Natasha Mead, M.D.     Scheduled Meds:    . budesonide-formoterol  2 puff Inhalation BID    . calcium-vitamin D  1 tablet Oral Daily  . ceFEPime (MAXIPIME) IV  1 g Intravenous Q24H  . donepezil  5 mg Oral QHS   Continuous Infusions:    . sodium chloride 100 mL/hr at 05/11/12 1323    Time spent: 25 minutes.   LOS: 1 day   RAMA,CHRISTINA  [facility] Pager [phone].  If 8PM-8AM, please contact night-coverage at [email or web address], password [facility] 05/11/2012, 1:59 PM

## 2012-05-12 MED ORDER — CIPROFLOXACIN IN D5W 400 MG/200ML IV SOLN
400.0000 mg | INTRAVENOUS | Status: DC
  Administered 2012-05-12 – 2012-05-13 (×2): 400 mg via INTRAVENOUS
  Filled 2012-05-12 (×3): qty 200

## 2012-05-12 MED ORDER — IBUPROFEN 800 MG PO TABS: 400.0000 mg | ORAL_TABLET | Freq: Four times a day (QID) | ORAL | Status: DC | PRN

## 2012-05-12 MED ORDER — ENSURE COMPLETE PO LIQD
237.0000 mL | Freq: Three times a day (TID) | ORAL | Status: DC
  Administered 2012-05-13: 237 mL via ORAL

## 2012-05-12 MED ORDER — FLUCONAZOLE IN SODIUM CHLORIDE 200-0.9 MG/100ML-% IV SOLN
200.0000 mg | INTRAVENOUS | Status: DC
  Administered 2012-05-12 – 2012-05-13 (×2): 200 mg via INTRAVENOUS
  Filled 2012-05-12 (×3): qty 100

## 2012-05-12 MED ORDER — ALUM & MAG HYDROXIDE-SIMETH 200-200-20 MG/5ML PO SUSP: 30.0000 mL | ORAL | Status: DC | PRN

## 2012-05-12 NOTE — Consult Note (Signed)
WOC ostomy consult  Stoma type/location: urostomy, RLQ. Pt has had since 2008, she is independent with the care of her stoma. She wears a Film/video editor and is currently connected to bedside drainage bag. She reports new onset of intense red rash around her stoma since she has been hospitalized. Pt on antibiotic tx for UTI. Stomal assessment/size: 7/8-1" round stoma, pink and moist Peristomal assessment: intense redness with few satellite lesions note at perimeter of the rash, she does not report this to be pruritic but is tender to the touch.  Treatment options for stomal/peristomal skin: "crusted" the peristomal skin with antifungal powder, demonstrated this to pt and her caregiver.  Explained rationale for the use of the powder.  This may lessen her current 3 day wear time.   Output clear yellow urine in the pouch that we removed Ostomy pouching: 1pc convex urostomy pouch used, pts daughter cut new wafer to fit stoma. Education provided: Demonstrated use of "wick" in the pts stoma to keep peristomal skin dry during pouch change.  She is independent with her care otherwise. Extra ostomy pouches ordered to the bedside for pt use.   Re consult if needed, will not follow at this time. Thanks  Paras Kreider [employer], CWOCN [phone])

## 2012-05-12 NOTE — Progress Notes (Signed)
[facility] PROGRESS NOTE  JOANMARIE TSANG ZOX:[id number] DOB: [birth year] DOA: 05/10/2012 PCP: Aura Dials, MD Urologist: Dr. Brunilda Payor  Brief narrative: Mrs. Pauling is an 78 year old female with a past medical history of bladder cancer status post cystectomy and chronic urostomy along with recurrent problems with urinary tract infection with a history of Pseudomonas infection who was admitted with fever and recurrent urinary tract infection on 05/10/2012. She was put on empiric Cipro and cefepime to cover Pseudomonas while awaiting urine culture results.  Assessment/Plan: Principal Problem:  *UTI (urinary tract infection)  Initially put on cefepime and Cipro due to a history of Pseudomonas. Cultures growing Klebsiella Pneumoniae, Cipro sensitive. D/C cefepime. Active Problems:  Fever  Antipyretics as needed.  Acute renal failure  Prerenal in etiology given poor oral intake. Continue IV fluids. Creatinine improved.  Hypertension  Blood pressure medication on hold.  Diarrhea  Send stools for C. difficile PCR.  Nausea  Anti-nausea medications as needed. We'll change to parenteral route.  Anorexia  Diet as tolerated.  Code Status: Full Family Communication: Updated at bedside. Disposition Plan: Home when stable.   Medical Consultants:  None.  Other Consultants:  None.  Anti-infectives:  Cipro 05/10/2012--->  Cefepime 05/10/2012--->05/12/12  Rocephin 05/10/2012---> 05/10/2012  HPI/Subjective: Ms. Sarr continues to feel weak. She still has no appetite and is only taking in sips of liquids. She denies actual vomiting. Does have persistent nausea. She has had 5 loose stools through the night.  Objective: Filed Vitals:   05/12/12 0205 05/12/12 0321 05/12/12 0624 05/12/12 0908  BP:   147/45   Pulse:   80   Temp: 99.4 F (37.4 C) 98.5 F (36.9 C) 98.1 F (36.7 C)   TempSrc: Oral Oral Oral   Resp:   16   Height:      Weight:      SpO2:   100% 98%     Intake/Output Summary (Last 24 hours) at 05/12/12 1029 Last data filed at 05/12/12 8119  Gross per 24 hour  Intake   2505 ml  Output   1200 ml  Net   1305 ml    Exam: Gen:  NAD Cardiovascular:  RRR, No M/R/G Respiratory:  Lungs CTAB Gastrointestinal:  Abdomen soft, NT/ND, + BS. Urostomy RLQ without signs of infection. Extremities:  Trace edema  Data Reviewed: Basic Metabolic Panel:  Lab 05/11/12 1478 05/10/12 1055  NA 137 134*  K 4.1 4.2  CL 109 103  CO2 17* 21  GLUCOSE 82 99  BUN 21 25*  CREATININE 1.41* 1.80*  CALCIUM 8.2* 9.4  MG -- --  PHOS -- --   GFR Estimated Creatinine Clearance: 23.4 ml/min (by C-G formula based on Cr of 1.41). Liver Function Tests:  Lab 05/11/12 0400  AST 34  ALT 18  ALKPHOS 80  BILITOT 0.2*  PROT 6.4  ALBUMIN 2.7*   CBC:  Lab 05/11/12 0400 05/10/12 1055  WBC 12.4* 8.6  NEUTROABS -- --  HGB 10.7* 11.9*  HCT 32.0* 34.7*  MCV 93.0 91.1  PLT 174 196   Microbiology Recent Results (from the past 240 hour(s))  URINE CULTURE     Status: Normal   Collection Time   05/10/12 11:24 AM      Component Value Range Status Comment   Specimen Description URINE, CLEAN CATCH   Final    Special Requests NONE   Final    Culture  Setup Time 05/10/2012 16:08   Final    Colony Count >=100,000 COLONIES/ML   Final  Culture KLEBSIELLA PNEUMONIAE   Final    Report Status 05/11/2012 FINAL   Final    Organism ID, Bacteria KLEBSIELLA PNEUMONIAE   Final   CULTURE, BLOOD (ROUTINE X 2)     Status: Normal (Preliminary result)   Collection Time   05/10/12  4:20 PM      Component Value Range Status Comment   Specimen Description BLOOD RIGHT HAND   Final    Special Requests BOTTLES DRAWN AEROBIC AND ANAEROBIC 5CC   Final    Culture  Setup Time 05/11/2012 00:25   Final    Culture     Final    Value:        BLOOD CULTURE RECEIVED NO GROWTH TO DATE CULTURE WILL BE HELD FOR 5 DAYS BEFORE ISSUING A FINAL NEGATIVE REPORT   Report Status PENDING    Incomplete   CULTURE, BLOOD (ROUTINE X 2)     Status: Normal (Preliminary result)   Collection Time   05/10/12  4:24 PM      Component Value Range Status Comment   Specimen Description BLOOD RIGHT HAND   Final    Special Requests BOTTLES DRAWN AEROBIC AND ANAEROBIC 5CC   Final    Culture  Setup Time 05/11/2012 00:25   Final    Culture     Final    Value:        BLOOD CULTURE RECEIVED NO GROWTH TO DATE CULTURE WILL BE HELD FOR 5 DAYS BEFORE ISSUING A FINAL NEGATIVE REPORT   Report Status PENDING   Incomplete      Procedures and Diagnostic Studies: Dg Chest 2 View  05/10/2012  *RADIOLOGY REPORT*  Clinical Data: Fever  CHEST - 2 VIEW  Comparison: 03/15/2011  Findings: Hyperinflation again noted.  Stable bilateral apical pleuroparenchymal scarring.  No acute infiltrate or pulmonary edema.  Atherosclerotic calcifications of thoracic aorta again noted.  Right humeral prosthesis.  IMPRESSION: No active disease.  Hyperinflation again noted.   Original Report Authenticated By: Natasha Mead, M.D.     Scheduled Meds:    . budesonide-formoterol  2 puff Inhalation BID  . calcium-vitamin D  1 tablet Oral Daily  . ceFEPime (MAXIPIME) IV  1 g Intravenous Q24H  . donepezil  5 mg Oral QHS   Continuous Infusions:    . sodium chloride 100 mL/hr at 05/12/12 1024    Time spent: 25 minutes.   LOS: 2 days   RAMA,CHRISTINA  [facility] Pager [phone].  If 8PM-8AM, please contact night-coverage at [email or web address], password [facility] 05/12/2012, 10:29 AM

## 2012-05-13 DIAGNOSIS — E876 Hypokalemia: Secondary | ICD-10-CM | POA: Diagnosis not present

## 2012-05-13 DIAGNOSIS — B372 Candidiasis of skin and nail: Secondary | ICD-10-CM | POA: Diagnosis present

## 2012-05-13 LAB — CBC
HCT: 30 % — ABNORMAL LOW (ref 36.0–46.0)
Hemoglobin: 10.1 g/dL — ABNORMAL LOW (ref 12.0–15.0)
MCV: 91.5 fL (ref 78.0–100.0)
RBC: 3.28 MIL/uL — ABNORMAL LOW (ref 3.87–5.11)
WBC: 6.3 10*3/uL (ref 4.0–10.5)

## 2012-05-13 LAB — BASIC METABOLIC PANEL
BUN: 11 mg/dL (ref 6–23)
CO2: 16 mEq/L — ABNORMAL LOW (ref 19–32)
Chloride: 113 mEq/L — ABNORMAL HIGH (ref 96–112)
Creatinine, Ser: 0.95 mg/dL (ref 0.50–1.10)
GFR calc Af Amer: 63 mL/min — ABNORMAL LOW (ref >90)

## 2012-05-13 MED ORDER — POTASSIUM CHLORIDE IN NACL 40-0.9 MEQ/L-% IV SOLN
INTRAVENOUS | Status: DC
  Administered 2012-05-13 – 2012-05-14 (×4): via INTRAVENOUS
  Filled 2012-05-13 (×4): qty 1000

## 2012-05-13 NOTE — Progress Notes (Signed)
[facility] PROGRESS NOTE  CHIKA CICHOWSKI ZOX:[id number] DOB: 19-Aug-1927 DOA: 05/10/2012 PCP: Aura Dials, MD Urologist: Dr. Brunilda Payor  Brief narrative: Mrs. Ammon is an 78 year old female with a past medical history of bladder cancer status post cystectomy and chronic urostomy along with recurrent problems with urinary tract infection with a history of Pseudomonas infection who was admitted with fever and recurrent urinary tract infection on 05/10/2012. She was put on empiric Cipro and cefepime to cover Pseudomonas while awaiting urine culture results.  Assessment/Plan: Principal Problem:  *Pyelonephritis  Initially put on cefepime and Cipro due to a history of Pseudomonas. Cultures growing Klebsiella Pneumoniae, Cipro sensitive.  Active Problems: Hypokalemia  Will add potassium to IV fluids.  Cutaneous candidiasis  Placed on Diflucan 05/12/2012.  Fever  Antipyretics as needed.  Acute renal failure  Prerenal in etiology given poor oral intake. Continue IV fluids. Creatinine improved.  Hypertension  Blood pressure medication on hold.  Diarrhea  C. difficile PCR negative. Improving.  Nausea  Anti-nausea medications as needed.   Anorexia  Diet as tolerated.  Code Status: Full Family Communication: Updated at bedside. Disposition Plan: Home when stable, possibly in the next 24-48 hours.   Medical Consultants:  None.  Other Consultants:  None.  Anti-infectives:  Cipro 05/10/2012--->  Cefepime 05/10/2012--->05/12/12  Rocephin 05/10/2012---> 05/10/2012  Diflucan 05/12/2012--->  HPI/Subjective: Ms. Nakama is beginning to feel better. She ate a bit of breakfast this morning and is asking when she might be able to be discharged. Diarrhea is slowing down.  Objective: Filed Vitals:   05/12/12 1448 05/12/12 2052 05/12/12 2135 05/13/12 0515  BP:   149/52 147/50  Pulse:   69 80  Temp:   98.4 F (36.9 C) 98.7 F (37.1 C)  TempSrc:   Oral Oral  Resp:    18 18  Height:      Weight: 49.584 kg (109 lb 5 oz)   50.3 kg (110 lb 14.3 oz)  SpO2:  95% 98% 98%    Intake/Output Summary (Last 24 hours) at 05/13/12 0739 Last data filed at 05/13/12 0600  Gross per 24 hour  Intake 2598.33 ml  Output   2050 ml  Net 548.33 ml    Exam: Gen:  NAD Cardiovascular:  RRR, No M/R/G Respiratory:  Lungs CTAB Gastrointestinal:  Abdomen soft, NT/ND, + BS. Urostomy RLQ without signs of infection. Extremities:  Trace edema  Data Reviewed: Basic Metabolic Panel:  Lab 05/13/12 8119 05/11/12 0400 05/10/12 1055  NA 140 137 134*  K 3.3* 4.1 --  CL 113* 109 103  CO2 16* 17* 21  GLUCOSE 84 82 99  BUN 11 21 25*  CREATININE 0.95 1.41* 1.80*  CALCIUM 7.7* 8.2* 9.4  MG -- -- --  PHOS -- -- --   GFR Estimated Creatinine Clearance: 36.1 ml/min (by C-G formula based on Cr of 0.95). Liver Function Tests:  Lab 05/11/12 0400  AST 34  ALT 18  ALKPHOS 80  BILITOT 0.2*  PROT 6.4  ALBUMIN 2.7*   CBC:  Lab 05/13/12 0500 05/11/12 0400 05/10/12 1055  WBC 6.3 12.4* 8.6  NEUTROABS -- -- --  HGB 10.1* 10.7* 11.9*  HCT 30.0* 32.0* 34.7*  MCV 91.5 93.0 91.1  PLT 196 174 196   Microbiology Recent Results (from the past 240 hour(s))  URINE CULTURE     Status: Normal   Collection Time   05/10/12 11:24 AM      Component Value Range Status Comment   Specimen Description URINE, CLEAN CATCH  Final    Special Requests NONE   Final    Culture  Setup Time 05/10/2012 16:08   Final    Colony Count >=100,000 COLONIES/ML   Final    Culture KLEBSIELLA PNEUMONIAE   Final    Report Status 05/11/2012 FINAL   Final    Organism ID, Bacteria KLEBSIELLA PNEUMONIAE   Final   CULTURE, BLOOD (ROUTINE X 2)     Status: Normal (Preliminary result)   Collection Time   05/10/12  4:20 PM      Component Value Range Status Comment   Specimen Description BLOOD RIGHT HAND   Final    Special Requests BOTTLES DRAWN AEROBIC AND ANAEROBIC 5CC   Final    Culture  Setup Time 05/11/2012  00:25   Final    Culture     Final    Value:        BLOOD CULTURE RECEIVED NO GROWTH TO DATE CULTURE WILL BE HELD FOR 5 DAYS BEFORE ISSUING A FINAL NEGATIVE REPORT   Report Status PENDING   Incomplete   CULTURE, BLOOD (ROUTINE X 2)     Status: Normal (Preliminary result)   Collection Time   05/10/12  4:24 PM      Component Value Range Status Comment   Specimen Description BLOOD RIGHT HAND   Final    Special Requests BOTTLES DRAWN AEROBIC AND ANAEROBIC 5CC   Final    Culture  Setup Time 05/11/2012 00:25   Final    Culture     Final    Value:        BLOOD CULTURE RECEIVED NO GROWTH TO DATE CULTURE WILL BE HELD FOR 5 DAYS BEFORE ISSUING A FINAL NEGATIVE REPORT   Report Status PENDING   Incomplete   CLOSTRIDIUM DIFFICILE BY PCR     Status: Normal   Collection Time   05/12/12  9:00 AM      Component Value Range Status Comment   C difficile by pcr NEGATIVE  NEGATIVE Final      Procedures and Diagnostic Studies: Dg Chest 2 View  05/10/2012  *RADIOLOGY REPORT*  Clinical Data: Fever  CHEST - 2 VIEW  Comparison: 03/15/2011  Findings: Hyperinflation again noted.  Stable bilateral apical pleuroparenchymal scarring.  No acute infiltrate or pulmonary edema.  Atherosclerotic calcifications of thoracic aorta again noted.  Right humeral prosthesis.  IMPRESSION: No active disease.  Hyperinflation again noted.   Original Report Authenticated By: Natasha Mead, M.D.     Scheduled Meds:    . budesonide-formoterol  2 puff Inhalation BID  . calcium-vitamin D  1 tablet Oral Daily  . ciprofloxacin  400 mg Intravenous Q24H  . donepezil  5 mg Oral QHS  . feeding supplement  237 mL Oral TID WC  . fluconazole (DIFLUCAN) IV  200 mg Intravenous Q24H   Continuous Infusions:    . sodium chloride 100 mL/hr at 05/12/12 2122    Time spent: 25 minutes.   LOS: 3 days   Joylyn Duggin  [facility] Pager [phone].  If 8PM-8AM, please contact night-coverage at [email or web address], password [facility] 05/13/2012,  7:39 AM

## 2012-05-14 DIAGNOSIS — R197 Diarrhea, unspecified: Secondary | ICD-10-CM

## 2012-05-14 DIAGNOSIS — R110 Nausea: Secondary | ICD-10-CM

## 2012-05-14 DIAGNOSIS — B372 Candidiasis of skin and nail: Secondary | ICD-10-CM

## 2012-05-14 LAB — BASIC METABOLIC PANEL
BUN: 7 mg/dL (ref 6–23)
CO2: 17 mEq/L — ABNORMAL LOW (ref 19–32)
Chloride: 114 mEq/L — ABNORMAL HIGH (ref 96–112)
Creatinine, Ser: 0.89 mg/dL (ref 0.50–1.10)
Glucose, Bld: 101 mg/dL — ABNORMAL HIGH (ref 70–99)
Potassium: 4.2 mEq/L (ref 3.5–5.1)

## 2012-05-14 LAB — CBC
HCT: 29.9 % — ABNORMAL LOW (ref 36.0–46.0)
Hemoglobin: 10 g/dL — ABNORMAL LOW (ref 12.0–15.0)
MCHC: 33.4 g/dL (ref 30.0–36.0)
MCV: 90.9 fL (ref 78.0–100.0)
RDW: 14.1 % (ref 11.5–15.5)

## 2012-05-14 MED ORDER — CIPROFLOXACIN HCL 500 MG PO TABS: 500.0000 mg | ORAL_TABLET | Freq: Two times a day (BID) | ORAL | Status: DC

## 2012-05-14 MED ORDER — FLUCONAZOLE 100 MG PO TABS: 100.0000 mg | ORAL_TABLET | Freq: Every day | ORAL | Status: DC

## 2012-05-14 NOTE — Discharge Summary (Signed)
Physician Discharge Summary  [name]:[id number] DOB: 1927-12-30 DOA: 05/10/2012  PCP: Aura Dials, MD  Admit date: 05/10/2012 Discharge date: 05/14/2012  Recommendations for Outpatient Follow-up:  1. With PCP and urology per scheduled appointment  Discharge Diagnoses:  Principal Problem:  *Pyelonephritis Active Problems:  Fever  Acute renal failure  Hypertension  Diarrhea  Nausea  Anorexia  Cutaneous candidiasis  Hypokalemia  Discharge Condition: medically stable for discharge home today  Diet recommendation: as tolerated  History of present illness:  78 year old female with a past medical history of bladder cancer status post cystectomy and chronic urostomy along with recurrent problems with urinary tract infection with a history of Pseudomonas infection who was admitted with fever and recurrent urinary tract infection on 05/10/2012. She was put on empiric Cipro and cefepime to cover Pseudomonas while awaiting urine culture results.   Assessment/Plan:   Principal Problem:  *Pyelonephritis  Initially put on cefepime and Cipro due to a history of Pseudomonas. Cultures growing Klebsiella Pneumoniae, Cipro sensitive.  Active Problems:  Hypokalemia  Added potassium to IV fluids. Cutaneous candidiasis  Placed on Diflucan 05/12/2012 and this will be continued for at least next 2 weeks. Fever  Antipyretics as needed. Acute renal failure  Prerenal in etiology given poor oral intake Creatinine improved. Hypertension  Blood pressure medication on hold while in hspital Diarrhea  C. difficile PCR negative. Improving. Nausea  Anti-nausea medications as needed.  Anorexia  Diet as tolerated.   Code Status: Full  Family Communication: Updated at bedside.  Disposition Plan: Home when stable, possibly in the next 24-48 hours.    Medical Consultants:  None. Other Consultants:  None. Anti-infectives:  Cipro 05/10/2012---> next 3 days post  discharge Cefepime 05/10/2012--->05/12/12  Rocephin 05/10/2012---> 05/10/2012  Diflucan 05/12/2012---> for next 2 weeks post discahrge  Discharge Exam: Filed Vitals:   05/14/12 0439  BP: 146/54  Pulse: 64  Temp: 98.6 F (37 C)  Resp: 18   Filed Vitals:   05/13/12 1055 05/13/12 1400 05/14/12 0439 05/14/12 0621  BP: 147/51 119/61 146/54   Pulse: 72 85 64   Temp: 98.8 F (37.1 C) 97.7 F (36.5 C) 98.6 F (37 C)   TempSrc: Oral Oral Oral   Resp: 18 16 18    Height:      Weight:    49.3 kg (108 lb 11 oz)  SpO2: 96%  96%     General: Pt is alert, follows commands appropriately, not in acute distress Cardiovascular: Regular rate and rhythm, S1/S2 +, no murmurs, no rubs, no gallops Respiratory: Clear to auscultation bilaterally, no wheezing, no crackles, no rhonchi Abdominal: Soft, non tender, non distended, bowel sounds +, no guarding Extremities: no edema, no cyanosis, pulses palpable bilaterally DP and PT Neuro: Grossly nonfocal  Discharge Instructions  Discharge Orders    Future Orders Please Complete By Expires   Diet - low sodium heart healthy      Increase activity slowly      Discharge instructions      Comments:   Please make appointment with Dr. Brunilda Payor to reassess the need for fluconazole Take fluconazole for next 2 weeks.   Call MD for:  persistant nausea and vomiting      Call MD for:  severe uncontrolled pain      Call MD for:  difficulty breathing, headache or visual disturbances      Call MD for:  persistant dizziness or light-headedness          Medication List  As of 05/14/2012  8:14 AM    TAKE these medications         acetaminophen 325 MG tablet   Commonly known as: TYLENOL   Take 650 mg by mouth every 6 (six) hours as needed. PAIN      alendronate 70 MG tablet   Commonly known as: FOSAMAX   Take 70 mg by mouth every Sunday. Take with a full glass of water on an empty stomach. Hold while in hospital.      amLODipine 5 MG tablet   Commonly  known as: NORVASC   Take 10 mg by mouth daily.      AZO-CRANBERRY 450 MG Tabs   Generic drug: Cranberry   Take 1 tablet by mouth every morning.      budesonide-formoterol 80-4.5 MCG/ACT inhaler   Commonly known as: SYMBICORT   Inhale 2 puffs into the lungs 2 (two) times daily.      calcium-vitamin D 500-200 MG-UNIT per tablet   Commonly known as: OSCAL WITH D   Take 1 tablet by mouth daily.      ciprofloxacin 500 MG tablet   Commonly known as: CIPRO   Take 1 tablet (500 mg total) by mouth every 12 (twelve) hours.      cloNIDine 0.1 MG tablet   Commonly known as: CATAPRES   Take 0.2 mg by mouth 2 (two) times daily.      donepezil 5 MG tablet   Commonly known as: ARICEPT   Take 5 mg by mouth daily.      fluconazole 100 MG tablet   Commonly known as: DIFLUCAN   Take 1 tablet (100 mg total) by mouth daily.      loperamide 2 MG capsule   Commonly known as: IMODIUM   Take 2 mg by mouth 4 (four) times daily as needed.      ondansetron 4 MG tablet   Commonly known as: ZOFRAN   Take 4 mg by mouth every 8 (eight) hours as needed. For nausea             Follow-up Information    Follow up with BOUSKA,DAVID E, MD. In 2 weeks. (As needed as needed)    Contact information:   [address] [place] [address] [phone]       Schedule an appointment as soon as possible for a visit with NESI,MARC-HENRY, MD.   Contact information:   [address] [place] [address] [phone]           The results of significant diagnostics from this hospitalization (including imaging, microbiology, ancillary and laboratory) are listed below for reference.    Significant Diagnostic Studies: Dg Chest 2 View  05/10/2012  *RADIOLOGY REPORT*  Clinical Data: Fever  CHEST - 2 VIEW  Comparison: 03/15/2011  Findings: Hyperinflation again noted.  Stable bilateral apical pleuroparenchymal scarring.  No acute infiltrate or  pulmonary edema.  Atherosclerotic calcifications of thoracic aorta again noted.  Right humeral prosthesis.  IMPRESSION: No active disease.  Hyperinflation again noted.   Original Report Authenticated By: Natasha Mead, M.D.     Microbiology: Recent Results (from the past 240 hour(s))  URINE CULTURE     Status: Normal   Collection Time   05/10/12 11:24 AM      Component Value Range Status Comment   Specimen Description  URINE, CLEAN CATCH   Final    Special Requests NONE   Final    Culture  Setup Time 05/10/2012 16:08   Final    Colony Count >=100,000 COLONIES/ML   Final    Culture KLEBSIELLA PNEUMONIAE   Final    Report Status 05/11/2012 FINAL   Final    Organism ID, Bacteria KLEBSIELLA PNEUMONIAE   Final   CULTURE, BLOOD (ROUTINE X 2)     Status: Normal (Preliminary result)   Collection Time   05/10/12  4:20 PM      Component Value Range Status Comment   Specimen Description BLOOD RIGHT HAND   Final    Special Requests BOTTLES DRAWN AEROBIC AND ANAEROBIC 5CC   Final    Culture  Setup Time 05/11/2012 00:25   Final    Culture     Final    Value:        BLOOD CULTURE RECEIVED NO GROWTH TO DATE CULTURE WILL BE HELD FOR 5 DAYS BEFORE ISSUING A FINAL NEGATIVE REPORT   Report Status PENDING   Incomplete   CULTURE, BLOOD (ROUTINE X 2)     Status: Normal (Preliminary result)   Collection Time   05/10/12  4:24 PM      Component Value Range Status Comment   Specimen Description BLOOD RIGHT HAND   Final    Special Requests BOTTLES DRAWN AEROBIC AND ANAEROBIC 5CC   Final    Culture  Setup Time 05/11/2012 00:25   Final    Culture     Final    Value:        BLOOD CULTURE RECEIVED NO GROWTH TO DATE CULTURE WILL BE HELD FOR 5 DAYS BEFORE ISSUING A FINAL NEGATIVE REPORT   Report Status PENDING   Incomplete   CLOSTRIDIUM DIFFICILE BY PCR     Status: Normal   Collection Time   05/12/12  9:00 AM      Component Value Range Status Comment   C difficile by pcr NEGATIVE  NEGATIVE Final       Labs: Basic Metabolic Panel:  Lab 05/14/12 2841 05/13/12 0500 05/11/12 0400 05/10/12 1055  NA 140 140 137 134*  K 4.2 3.3* 4.1 4.2  CL 114* 113* 109 103  CO2 17* 16* 17* 21  GLUCOSE 101* 84 82 99  BUN 7 11 21  25*  CREATININE 0.89 0.95 1.41* 1.80*  CALCIUM 8.0* 7.7* 8.2* 9.4  MG -- -- -- --  PHOS -- -- -- --   Liver Function Tests:  Lab 05/11/12 0400  AST 34  ALT 18  ALKPHOS 80  BILITOT 0.2*  PROT 6.4  ALBUMIN 2.7*   No results found for this basename: LIPASE:5,AMYLASE:5 in the last 168 hours No results found for this basename: AMMONIA:5 in the last 168 hours CBC:  Lab 05/14/12 0410 05/13/12 0500 05/11/12 0400 05/10/12 1055  WBC 4.8 6.3 12.4* 8.6  NEUTROABS -- -- -- --  HGB 10.0* 10.1* 10.7* 11.9*  HCT 29.9* 30.0* 32.0* 34.7*  MCV 90.9 91.5 93.0 91.1  PLT 217 196 174 196   Cardiac Enzymes: No results found for this basename: CKTOTAL:5,CKMB:5,CKMBINDEX:5,TROPONINI:5 in the last 168 hours BNP: BNP (last 3 results) No results found for this basename: PROBNP:3 in the last 8760 hours CBG: No results found for this basename: GLUCAP:5 in the last 168 hours  Time coordinating discharge: Over 30 minutes  Signed:  Manson Passey, MD  [facility] 05/14/2012, 8:14 AM  Pager #: [phone]

## 2012-05-16 NOTE — Progress Notes (Signed)
Discharge summary sent to payer through [facility]  

## 2012-05-17 LAB — CULTURE, BLOOD (ROUTINE X 2): Culture: NO GROWTH

## 2012-07-01 ENCOUNTER — Other Ambulatory Visit: Payer: Self-pay

## 2012-12-20 ENCOUNTER — Other Ambulatory Visit: Payer: Self-pay

## 2013-03-22 ENCOUNTER — Other Ambulatory Visit: Payer: Self-pay

## 2014-09-13 ENCOUNTER — Other Ambulatory Visit: Payer: Self-pay | Admitting: Family Medicine

## 2014-09-13 DIAGNOSIS — M810 Age-related osteoporosis without current pathological fracture: Secondary | ICD-10-CM

## 2014-11-11 ENCOUNTER — Other Ambulatory Visit: Payer: Self-pay

## 2014-11-14 ENCOUNTER — Other Ambulatory Visit: Payer: Medicare Other

## 2014-11-15 ENCOUNTER — Ambulatory Visit
Admission: RE | Admit: 2014-11-15 | Discharge: 2014-11-15 | Disposition: A | Discharge status: Home / Self Care | Payer: Medicare Other | Source: Ambulatory Visit | Attending: Family Medicine | Admitting: Family Medicine

## 2014-11-15 DIAGNOSIS — M810 Age-related osteoporosis without current pathological fracture: Secondary | ICD-10-CM

## 2015-03-14 ENCOUNTER — Other Ambulatory Visit: Payer: Self-pay | Admitting: Physician Assistant

## 2015-08-13 ENCOUNTER — Encounter (HOSPITAL_COMMUNITY): Payer: Self-pay

## 2015-08-13 ENCOUNTER — Emergency Department (HOSPITAL_COMMUNITY): Payer: Medicare Other

## 2015-08-13 ENCOUNTER — Inpatient Hospital Stay (HOSPITAL_COMMUNITY)
Admission: EM | Admit: 2015-08-13 | Discharge: 2015-08-19 | DRG: 689 | Disposition: A | Discharge status: Home Health | Payer: Medicare Other | Attending: Internal Medicine | Admitting: Internal Medicine

## 2015-08-13 DIAGNOSIS — F0390 Unspecified dementia without behavioral disturbance: Secondary | ICD-10-CM | POA: Diagnosis present

## 2015-08-13 DIAGNOSIS — N179 Acute kidney failure, unspecified: Secondary | ICD-10-CM | POA: Diagnosis present

## 2015-08-13 DIAGNOSIS — G9340 Encephalopathy, unspecified: Secondary | ICD-10-CM | POA: Diagnosis present

## 2015-08-13 DIAGNOSIS — Z8551 Personal history of malignant neoplasm of bladder: Secondary | ICD-10-CM

## 2015-08-13 DIAGNOSIS — Z87442 Personal history of urinary calculi: Secondary | ICD-10-CM | POA: Diagnosis not present

## 2015-08-13 DIAGNOSIS — M810 Age-related osteoporosis without current pathological fracture: Secondary | ICD-10-CM | POA: Diagnosis present

## 2015-08-13 DIAGNOSIS — I10 Essential (primary) hypertension: Secondary | ICD-10-CM | POA: Diagnosis present

## 2015-08-13 DIAGNOSIS — Z906 Acquired absence of other parts of urinary tract: Secondary | ICD-10-CM | POA: Diagnosis not present

## 2015-08-13 DIAGNOSIS — Z96611 Presence of right artificial shoulder joint: Secondary | ICD-10-CM | POA: Diagnosis present

## 2015-08-13 DIAGNOSIS — R509 Fever, unspecified: Secondary | ICD-10-CM | POA: Diagnosis not present

## 2015-08-13 DIAGNOSIS — N390 Urinary tract infection, site not specified: Secondary | ICD-10-CM | POA: Diagnosis not present

## 2015-08-13 DIAGNOSIS — Z936 Other artificial openings of urinary tract status: Secondary | ICD-10-CM

## 2015-08-13 LAB — URINE MICROSCOPIC-ADD ON

## 2015-08-13 LAB — COMPREHENSIVE METABOLIC PANEL
ALT: 8 U/L — AB (ref 14–54)
AST: 18 U/L (ref 15–41)
Albumin: 4 g/dL (ref 3.5–5.0)
Alkaline Phosphatase: 46 U/L (ref 38–126)
Anion gap: 8 (ref 5–15)
BILIRUBIN TOTAL: 0.7 mg/dL (ref 0.3–1.2)
BUN: 20 mg/dL (ref 6–20)
CALCIUM: 9.6 mg/dL (ref 8.9–10.3)
CHLORIDE: 108 mmol/L (ref 101–111)
CO2: 24 mmol/L (ref 22–32)
CREATININE: 1.83 mg/dL — AB (ref 0.44–1.00)
GFR, EST AFRICAN AMERICAN: 28 mL/min — AB (ref >60)
GFR, EST NON AFRICAN AMERICAN: 24 mL/min — AB (ref >60)
Glucose, Bld: 106 mg/dL — ABNORMAL HIGH (ref 65–99)
Potassium: 3.8 mmol/L (ref 3.5–5.1)
Sodium: 140 mmol/L (ref 135–145)
TOTAL PROTEIN: 6.9 g/dL (ref 6.5–8.1)

## 2015-08-13 LAB — CBC WITH DIFFERENTIAL/PLATELET
Basophils Absolute: 0 10*3/uL (ref 0.0–0.1)
Basophils Relative: 0 %
EOS PCT: 0 %
Eosinophils Absolute: 0 10*3/uL (ref 0.0–0.7)
HCT: 33.9 % — ABNORMAL LOW (ref 36.0–46.0)
Hemoglobin: 11.5 g/dL — ABNORMAL LOW (ref 12.0–15.0)
LYMPHS ABS: 0.6 10*3/uL — AB (ref 0.7–4.0)
LYMPHS PCT: 6 %
MCH: 33 pg (ref 26.0–34.0)
MCHC: 33.9 g/dL (ref 30.0–36.0)
MCV: 97.1 fL (ref 78.0–100.0)
MONO ABS: 0.8 10*3/uL (ref 0.1–1.0)
Monocytes Relative: 7 %
Neutro Abs: 9.2 10*3/uL — ABNORMAL HIGH (ref 1.7–7.7)
Neutrophils Relative %: 87 %
PLATELETS: 158 10*3/uL (ref 150–400)
RBC: 3.49 MIL/uL — AB (ref 3.87–5.11)
RDW: 12.5 % (ref 11.5–15.5)
WBC: 10.6 10*3/uL — ABNORMAL HIGH (ref 4.0–10.5)

## 2015-08-13 LAB — URINALYSIS, ROUTINE W REFLEX MICROSCOPIC
Bilirubin Urine: NEGATIVE
GLUCOSE, UA: NEGATIVE mg/dL
Ketones, ur: NEGATIVE mg/dL
Nitrite: POSITIVE — AB
PH: 5.5 (ref 5.0–8.0)
PROTEIN: NEGATIVE mg/dL
Specific Gravity, Urine: 1.014 (ref 1.005–1.030)

## 2015-08-13 MED ORDER — DEXTROSE 5 % IV SOLN
1.0000 g | Freq: Once | INTRAVENOUS | Status: AC
  Administered 2015-08-13: 1 g via INTRAVENOUS
  Filled 2015-08-13: qty 10

## 2015-08-13 MED ORDER — PANTOPRAZOLE SODIUM 40 MG PO TBEC
40.0000 mg | DELAYED_RELEASE_TABLET | Freq: Every day | ORAL | Status: DC
  Administered 2015-08-14 – 2015-08-19 (×6): 40 mg via ORAL
  Filled 2015-08-13 (×7): qty 1

## 2015-08-13 MED ORDER — OSELTAMIVIR PHOSPHATE 30 MG PO CAPS
30.0000 mg | ORAL_CAPSULE | Freq: Every day | ORAL | Status: DC
  Administered 2015-08-13 – 2015-08-14 (×2): 30 mg via ORAL
  Filled 2015-08-13 (×2): qty 1

## 2015-08-13 MED ORDER — LORATADINE 10 MG PO TABS
10.0000 mg | ORAL_TABLET | Freq: Every day | ORAL | Status: DC
  Administered 2015-08-13 – 2015-08-19 (×7): 10 mg via ORAL
  Filled 2015-08-13 (×7): qty 1

## 2015-08-13 MED ORDER — ACETAMINOPHEN 325 MG PO TABS
650.0000 mg | ORAL_TABLET | Freq: Once | ORAL | Status: AC
  Administered 2015-08-13: 650 mg via ORAL
  Filled 2015-08-13: qty 2

## 2015-08-13 MED ORDER — SODIUM CHLORIDE 0.9 % IV BOLUS (SEPSIS)
500.0000 mL | Freq: Once | INTRAVENOUS | Status: AC
  Administered 2015-08-13: 500 mL via INTRAVENOUS

## 2015-08-13 MED ORDER — ONDANSETRON 4 MG PO TBDP: 4.0000 mg | ORAL_TABLET | Freq: Two times a day (BID) | ORAL | Status: DC | PRN

## 2015-08-13 MED ORDER — CIPROFLOXACIN HCL 250 MG PO TABS: 250.0000 mg | ORAL_TABLET | Freq: Two times a day (BID) | ORAL | Status: DC

## 2015-08-13 MED ORDER — ACETAMINOPHEN 650 MG RE SUPP: 650.0000 mg | Freq: Four times a day (QID) | RECTAL | Status: DC | PRN

## 2015-08-13 MED ORDER — DONEPEZIL HCL 10 MG PO TABS
10.0000 mg | ORAL_TABLET | Freq: Every day | ORAL | Status: DC
  Administered 2015-08-13 – 2015-08-18 (×6): 10 mg via ORAL
  Filled 2015-08-13 (×6): qty 1

## 2015-08-13 MED ORDER — ACETAMINOPHEN 325 MG PO TABS
650.0000 mg | ORAL_TABLET | Freq: Four times a day (QID) | ORAL | Status: DC | PRN
  Administered 2015-08-13 – 2015-08-15 (×6): 650 mg via ORAL
  Filled 2015-08-13 (×6): qty 2

## 2015-08-13 MED ORDER — ONDANSETRON HCL 4 MG PO TABS: 4.0000 mg | ORAL_TABLET | Freq: Four times a day (QID) | ORAL | Status: DC | PRN

## 2015-08-13 MED ORDER — SODIUM CHLORIDE 0.9 % IV SOLN
INTRAVENOUS | Status: DC
Start: 2015-08-13 — End: 2015-08-14
  Administered 2015-08-13 – 2015-08-14 (×2): via INTRAVENOUS

## 2015-08-13 MED ORDER — CLONAZEPAM 0.125 MG PO TBDP: 0.1250 mg | ORAL_TABLET | Freq: Every day | ORAL | Status: DC | PRN

## 2015-08-13 MED ORDER — MOMETASONE FURO-FORMOTEROL FUM 100-5 MCG/ACT IN AERO
2.0000 | INHALATION_SPRAY | Freq: Two times a day (BID) | RESPIRATORY_TRACT | Status: DC
  Administered 2015-08-14 – 2015-08-19 (×9): 2 via RESPIRATORY_TRACT
  Filled 2015-08-13: qty 8.8

## 2015-08-13 MED ORDER — ONDANSETRON HCL 4 MG/2ML IJ SOLN
4.0000 mg | Freq: Four times a day (QID) | INTRAMUSCULAR | Status: DC | PRN
  Administered 2015-08-15 – 2015-08-17 (×3): 4 mg via INTRAVENOUS
  Filled 2015-08-13 (×3): qty 2

## 2015-08-13 MED ORDER — FLUTICASONE PROPIONATE 50 MCG/ACT NA SUSP
2.0000 | Freq: Every day | NASAL | Status: DC | PRN
  Administered 2015-08-13: 2 via NASAL
  Filled 2015-08-13: qty 16

## 2015-08-13 MED ORDER — POLYETHYLENE GLYCOL 3350 17 G PO PACK: 17.0000 g | PACK | Freq: Every day | ORAL | Status: DC | PRN

## 2015-08-13 MED ORDER — HEPARIN SODIUM (PORCINE) 5000 UNIT/ML IJ SOLN
5000.0000 [IU] | Freq: Three times a day (TID) | INTRAMUSCULAR | Status: DC
  Administered 2015-08-13 – 2015-08-19 (×18): 5000 [IU] via SUBCUTANEOUS
  Filled 2015-08-13 (×17): qty 1

## 2015-08-13 MED ORDER — CEFTRIAXONE SODIUM 1 G IJ SOLR
1.0000 g | INTRAMUSCULAR | Status: DC
  Administered 2015-08-14 – 2015-08-15 (×2): 1 g via INTRAVENOUS
  Filled 2015-08-13 (×2): qty 10

## 2015-08-13 NOTE — H&P (Signed)
[facility] History and Physical  ELNER SULA [id number] DOB: [birth year] DOA: 08/13/2015  Referring physician: Dr. Tomi Bamberger PCP: [name], MD   Chief Complaint: confusion  HPI: [name] is a 82 y.o. female past medical history of bladder cancer status post cystectomy, diverging colostomy previously treated for UTIs in the past and has had Pseudomonas urinary tract infections that comes in For confusion. The patient cannot provide history and most of the history is obtained from family members. Relate that she woke up this morning and was complaining of feeling cold, also generalized body ache and just weakness. And was not acting as her usual self. She has not had any cough or shortness of breath. They deny she's been vomiting or complaining of abdominal pain.  In the ED: She was found to have new acute renal failure with a mild leukocytosis at 10.6 with a left shift, she also spiked a fever of 101.4, with a UA showed many bacteria positive for nitrates intubated, blood tests chest x-ray shows no acute findings.  Review of Systems:  Constitutional:  No weight loss, night sweats. HEENT:  No headaches, Difficulty swallowing,Tooth/dental problems,Sore throat,  No sneezing, itching, ear ache, nasal congestion, post nasal drip,  Cardio-vascular:  No chest pain, Orthopnea, PND, swelling in lower extremities, anasarca, dizziness, palpitations  GI:  No heartburn, indigestion, abdominal pain, nausea, vomiting, diarrhea, change in bowel habits, loss of appetite  Resp:  No shortness of breath with exertion or at rest. No excess mucus, no productive cough, No non-productive cough, No coughing up of blood.No change in color of mucus.No wheezing.No chest wall deformity  Skin:  no rash or lesions.  GU:  no dysuria, change in color of urine, no urgency or frequency. No flank pain.  Musculoskeletal:  No joint pain or swelling. No decreased range of motion. No back pain.    Psych:  No change in mood or affect. No depression or anxiety. No memory loss.   Past Medical History  Diagnosis Date  . Bladder cancer ([place])   . Kidney stones   . Hypertension   . Dementia   . Osteoporosis   . UTI (lower urinary tract infection)    Past Surgical History  Procedure Laterality Date  . Bladder removed due to cancer     . Revision urostomy cutaneous    . Total shoulder arthroplasty    . Cholecystectomy    . Abdominal hysterectomy    . Appendectomy     Social History:  reports that she has never smoked. She has never used smokeless tobacco. She reports that she does not drink alcohol or use illicit drugs.  Allergies  Allergen Reactions  . Codeine Nausea And Vomiting  . Metoclopramide Other (See Comments)    Makes patient "climb the walls"     Family History  Problem Relation Age of Onset  . Other Mother   . Heart attack Father    Prior to Admission medications   Medication Sig Start Date End Date Taking? Authorizing Provider  acetaminophen (TYLENOL) 325 MG tablet Take 650 mg by mouth every 6 (six) hours as needed. PAIN   Yes Historical Provider, MD  amLODipine (NORVASC) 5 MG tablet Take 10 mg by mouth daily.     Yes Historical Provider, MD  budesonide-formoterol (SYMBICORT) 80-4.5 MCG/ACT inhaler Inhale 2 puffs into the lungs 2 (two) times daily as needed (shortness of breath).    Yes Historical Provider, MD  Calcium Carb-Cholecalciferol (CALCIUM 600 + D PO) Take 1  tablet by mouth every morning.   Yes Historical Provider, MD  cephALEXin (KEFLEX) 250 MG capsule Take 250 mg by mouth daily.   Yes Historical Provider, MD  cetirizine (ZYRTEC) 10 MG tablet Take 10 mg by mouth daily as needed for allergies.   Yes Historical Provider, MD  clonazepam (KLONOPIN) 0.125 MG disintegrating tablet Take 0.125 mg by mouth daily as needed (anxiety).    Yes Historical Provider, MD  cloNIDine (CATAPRES) 0.1 MG tablet Take 0.1 mg by mouth 2 (two) times daily.    Yes Historical  Provider, MD  Cranberry (AZO-CRANBERRY) 450 MG TABS Take 1 tablet by mouth 2 (two) times daily.    Yes Historical Provider, MD  donepezil (ARICEPT) 10 MG tablet Take 10 mg by mouth at bedtime.   Yes Historical Provider, MD  fluticasone (FLONASE) 50 MCG/ACT nasal spray Place 2 sprays into both nostrils daily as needed for allergies or rhinitis.   Yes Historical Provider, MD  meclizine (ANTIVERT) 12.5 MG tablet Take 12.5 mg by mouth 3 (three) times daily as needed for dizziness.  07/07/15  Yes Historical Provider, MD  omeprazole (PRILOSEC) 40 MG capsule Take 40 mg by mouth every evening.   Yes Historical Provider, MD  ondansetron (ZOFRAN-ODT) 4 MG disintegrating tablet Take 4 mg by mouth 2 (two) times daily as needed for nausea or vomiting.   Yes Historical Provider, MD  ciprofloxacin (CIPRO) 250 MG tablet Take 1 tablet (250 mg total) by mouth 2 (two) times daily. 08/13/15   Dorie Rank, MD   Physical Exam: Filed Vitals:   08/13/15 1100 08/13/15 1130 08/13/15 1200 08/13/15 1253  BP: 122/66 115/44 115/46 124/42  Pulse:  81 83 80  Temp:    101.4 F (38.6 C)  TempSrc:    Oral  Resp: 19 20 20 18   Height:    5\' 1"  (1.549 m)  Weight:    48.081 kg (106 lb)  SpO2:  91% 95% 95%    Wt Readings from Last 3 Encounters:  08/13/15 48.081 kg (106 lb)  05/14/12 49.3 kg (108 lb 11 oz)  10/13/11 48.081 kg (106 lb)    General:  Appears calm and comfortable Eyes: PERRL, normal lids, irises & conjunctiva ENT: grossly normal hearing, lips & tongue Neck: no LAD, masses or thyromegaly Cardiovascular: RRR, no m/r/g. No LE edema. Telemetry: SR, no arrhythmias  Respiratory: CTA bilaterally, no w/r/r. Normal respiratory effort. Abdomen: soft, nontender nondistended with an urostomy bag in place urine looks clear. Skin: no rash or induration seen on limited exam Musculoskeletal: grossly normal tone BUE/BLE Psychiatric: grossly normal mood and affect, speech fluent and appropriate Neurologic: grossly non-focal.           Labs on Admission:  Basic Metabolic Panel:  Recent Labs Lab 08/13/15 0824  NA 140  K 3.8  CL 108  CO2 24  GLUCOSE 106*  BUN 20  CREATININE 1.83*  CALCIUM 9.6   Liver Function Tests:  Recent Labs Lab 08/13/15 0824  AST 18  ALT 8*  ALKPHOS 46  BILITOT 0.7  PROT 6.9  ALBUMIN 4.0   No results for input(s): LIPASE, AMYLASE in the last 168 hours. No results for input(s): AMMONIA in the last 168 hours. CBC:  Recent Labs Lab 08/13/15 0824  WBC 10.6*  NEUTROABS 9.2*  HGB 11.5*  HCT 33.9*  MCV 97.1  PLT 158   Cardiac Enzymes: No results for input(s): CKTOTAL, CKMB, CKMBINDEX, TROPONINI in the last 168 hours.  BNP (last 3 results) No results  for input(s): BNP in the last 8760 hours.  ProBNP (last 3 results) No results for input(s): PROBNP in the last 8760 hours.  CBG: No results for input(s): GLUCAP in the last 168 hours.  Radiological Exams on Admission: Dg Chest 2 View  08/13/2015  CLINICAL DATA:  Shortness of breath, fever today. EXAM: CHEST  2 VIEW COMPARISON:  05/10/2012 FINDINGS: Biapical pleural/ parenchymal scarring. Lungs otherwise clear. Heart is normal size. No effusions or acute bony abnormality. Diffuse aortic calcifications. Prior right shoulder replacement.  No acute bony abnormality. IMPRESSION: Biapical scarring.  No active disease. Electronically Signed   By: Rolm Baptise M.D.   On: 08/13/2015 09:17    EKG: Independently reviewed. Sinus rhythm normal axis no ST segment abnormalities.  Assessment/Plan Acute encephalopathy superimposed on dementia without behavioral disturbances: Likely due to infectious etiology superimposed on her dementia. She is nonfocal on physical examination will move all 4 extremities follow simple commands. Continue current home regimen.  Fever due to UTI (lower urinary tract infection): In the differential would include influenza infection. We'll check PCR started on Tamiflu. UA was grossly positive for  urinary tract infection, but her urine bag her urine is clear. I agree with IV Rocephin, she had a previous infection with Klebsiella pneumonia which was sensitive to Rocephin. Start empirical treatment for influenza check a PCR. Tylenol for pain and fever, started on clear liquid diet  Acute renal failure (HCC) Likely prerenal in etiology, will start on aggressive IV fluid hydration, and recheck a basic metabolic panel in the morning. Previous creatinine was less than 1 back in 2013.  Essential Hypertension: Antihypertensive medications as her blood pressure is normotensive. And due to her degree her acute renal failure.  Code Status: full DVT Prophylaxis:heparin Family Communication: son and daughter Disposition Plan: inpatient  Time spent: 18 min  Charlynne Cousins [facility] Pager [phone]

## 2015-08-13 NOTE — Discharge Instructions (Signed)

## 2015-08-13 NOTE — ED Notes (Signed)
Attempted to call report but nurse stated she would call back.

## 2015-08-13 NOTE — ED Notes (Signed)
Report given to Fred,RN.  

## 2015-08-13 NOTE — ED Notes (Signed)
Patient woke up this AM with fever, body aches , and weakness.

## 2015-08-13 NOTE — ED Provider Notes (Signed)
CSN: [id number]     Arrival date & time 08/13/15  [id number] History   First MD Initiated Contact with Patient 08/13/15 [phone]     Chief Complaint  Patient presents with  . Fever  . Nausea  . Weakness   HPI History was provided by the patient and her family members. Last evening she started complaining of some chills. This morning when she woke up she continued to feel chilled and the family thought she was feverish. She complained of some generalized body aches and was feeling weak. Just walking in her house required more effort than usual. Patient denies any trouble with any cough. She's had some runny nose recently but thought that was related to allergies. She denies any trouble with vomiting or abdominal pain however she has felt nauseated. The nausea is not a new thing. For many years off-and-on she will have bouts of nausea often when she is getting sick. She denies any trouble with headache or sore throat. No rashes. No focal numbness or weakness. No chest pain or shortness of breath Past Medical History  Diagnosis Date  . Bladder cancer ([place])   . Kidney stones   . Hypertension   . Dementia   . Osteoporosis   . UTI (lower urinary tract infection)    Past Surgical History  Procedure Laterality Date  . Bladder removed due to cancer     . Revision urostomy cutaneous    . Total shoulder arthroplasty    . Cholecystectomy    . Abdominal hysterectomy    . Appendectomy     Family History  Problem Relation Age of Onset  . Other Mother   . Heart attack Father    Social History  Substance Use Topics  . Smoking status: Never Smoker   . Smokeless tobacco: Never Used  . Alcohol Use: No   OB History    No data available     Review of Systems  All other systems reviewed and are negative.     Allergies  Codeine and Metoclopramide  Home Medications   Prior to Admission medications   Medication Sig Start Date End Date Taking? Authorizing Provider  acetaminophen (TYLENOL) 325 MG  tablet Take 650 mg by mouth every 6 (six) hours as needed. PAIN   Yes Historical Provider, MD  amLODipine (NORVASC) 5 MG tablet Take 10 mg by mouth daily.     Yes Historical Provider, MD  budesonide-formoterol (SYMBICORT) 80-4.5 MCG/ACT inhaler Inhale 2 puffs into the lungs 2 (two) times daily as needed (shortness of breath).    Yes Historical Provider, MD  Calcium Carb-Cholecalciferol (CALCIUM 600 + D PO) Take 1 tablet by mouth every morning.   Yes Historical Provider, MD  cephALEXin (KEFLEX) 250 MG capsule Take 250 mg by mouth daily.   Yes Historical Provider, MD  cetirizine (ZYRTEC) 10 MG tablet Take 10 mg by mouth daily as needed for allergies.   Yes Historical Provider, MD  clonazepam (KLONOPIN) 0.125 MG disintegrating tablet Take 0.125 mg by mouth daily as needed (anxiety).    Yes Historical Provider, MD  cloNIDine (CATAPRES) 0.1 MG tablet Take 0.1 mg by mouth 2 (two) times daily.    Yes Historical Provider, MD  Cranberry (AZO-CRANBERRY) 450 MG TABS Take 1 tablet by mouth 2 (two) times daily.    Yes Historical Provider, MD  donepezil (ARICEPT) 10 MG tablet Take 10 mg by mouth at bedtime.   Yes Historical Provider, MD  fluticasone (FLONASE) 50 MCG/ACT nasal spray Place 2 sprays  into both nostrils daily as needed for allergies or rhinitis.   Yes Historical Provider, MD  meclizine (ANTIVERT) 12.5 MG tablet Take 12.5 mg by mouth 3 (three) times daily as needed for dizziness.  07/07/15  Yes Historical Provider, MD  omeprazole (PRILOSEC) 40 MG capsule Take 40 mg by mouth every evening.   Yes Historical Provider, MD  ondansetron (ZOFRAN-ODT) 4 MG disintegrating tablet Take 4 mg by mouth 2 (two) times daily as needed for nausea or vomiting.   Yes Historical Provider, MD  ciprofloxacin (CIPRO) 250 MG tablet Take 1 tablet (250 mg total) by mouth 2 (two) times daily. 08/13/15   Dorie Rank, MD   BP 127/58 mmHg  Pulse 73  Temp(Src) 101.3 F (38.5 C) (Oral)  Resp 16  Ht 5\' 1"  (1.549 m)  Wt 48.081 kg  BMI  20.04 kg/m2  SpO2 100% Physical Exam  Constitutional: She appears well-developed and well-nourished. No distress.  HENT:  Head: Normocephalic and atraumatic.  Right Ear: External ear normal.  Left Ear: External ear normal.  Eyes: Conjunctivae are normal. Right eye exhibits no discharge. Left eye exhibits no discharge. No scleral icterus.  Neck: Neck supple. No tracheal deviation present.  Cardiovascular: Normal rate, regular rhythm and intact distal pulses.   Pulmonary/Chest: Effort normal and breath sounds normal. No stridor. No respiratory distress. She has no wheezes. She has no rales.  Abdominal: Soft. Bowel sounds are normal. She exhibits no distension. There is no tenderness. There is no rebound and no guarding.  Musculoskeletal: She exhibits no edema or tenderness.  Neurological: She is alert. She has normal strength. No cranial nerve deficit (no facial droop, extraocular movements intact, no slurred speech) or sensory deficit. She exhibits normal muscle tone. She displays no seizure activity. Coordination normal.  Patient is able to sit up in bed without assistance  Skin: Skin is warm and dry. No rash noted.  Psychiatric: She has a normal mood and affect.  Nursing note and vitals reviewed.   ED Course  Procedures (including critical care time) Labs Review Labs Reviewed  CBC WITH DIFFERENTIAL/PLATELET - Abnormal; Notable for the following:    WBC 10.6 (*)    RBC 3.49 (*)    Hemoglobin 11.5 (*)    HCT 33.9 (*)    Neutro Abs 9.2 (*)    Lymphs Abs 0.6 (*)    All other components within normal limits  URINALYSIS, ROUTINE W REFLEX MICROSCOPIC (NOT AT [facility]) - Abnormal; Notable for the following:    APPearance CLOUDY (*)    Hgb urine dipstick MODERATE (*)    Nitrite POSITIVE (*)    Leukocytes, UA LARGE (*)    All other components within normal limits  COMPREHENSIVE METABOLIC PANEL - Abnormal; Notable for the following:    Glucose, Bld 106 (*)    Creatinine, Ser 1.83 (*)     ALT 8 (*)    GFR calc non Af Amer 24 (*)    GFR calc Af Amer 28 (*)    All other components within normal limits  URINE MICROSCOPIC-ADD ON - Abnormal; Notable for the following:    Squamous Epithelial / LPF 6-30 (*)    Bacteria, UA MANY (*)    All other components within normal limits  URINE CULTURE    Imaging Review Dg Chest 2 View  08/13/2015  CLINICAL DATA:  Shortness of breath, fever today. EXAM: CHEST  2 VIEW COMPARISON:  05/10/2012 FINDINGS: Biapical pleural/ parenchymal scarring. Lungs otherwise clear. Heart is normal size. No  effusions or acute bony abnormality. Diffuse aortic calcifications. Prior right shoulder replacement.  No acute bony abnormality. IMPRESSION: Biapical scarring.  No active disease. Electronically Signed   By: Rolm Baptise M.D.   On: 08/13/2015 09:17   I have personally reviewed and evaluated these images and lab results as part of my medical decision-making.   EKG Interpretation   Date/Time:  Wednesday August 13 2015 08:52:40 EDT Ventricular Rate:  78 PR Interval:  185 QRS Duration: 83 QT Interval:  363 QTC Calculation: 413 R Axis:   74 Text Interpretation:  Sinus rhythm RSR' in V1 or V2, probably normal  variant Nonspecific T abnrm, anterolateral leads no change from old.  Confirmed by Johnney Killian, MD, Jeannie Done [id number]) on 08/13/2015 8:57:37 AM     Medications  cefTRIAXone (ROCEPHIN) 1 g in dextrose 5 % 50 mL IVPB (1 g Intravenous New Bag/Given 08/13/15 1046)  sodium chloride 0.9 % bolus 500 mL (0 mLs Intravenous Stopped 08/13/15 1039)  acetaminophen (TYLENOL) tablet 650 mg (650 mg Oral Given 08/13/15 1046)    MDM   Final diagnoses:  UTI (lower urinary tract infection)  Fever and chills    Pt presented to the ED with general malaise and fever.    Lab tests  Are consistent with a UTI.  She does have a diverting ileostomy however her urine typically shows a few wbc and bacteria but not TNTC wbc and many bacteria.  Pt improved with iv fluids.  She is not  hypotensive or tachycardic.  No signs of sepsis.  Initially Pt and family were comfortable with trying to treat this at home.  However, while here she developed a fever to 101.  In the past she has had more serious infections and this concerns them.  I have ordered iV abx.  i will consult with the medical service for admission.     Dorie Rank, MD 08/13/15 1115

## 2015-08-14 LAB — CBC
HEMATOCRIT: 32.5 % — AB (ref 36.0–46.0)
Hemoglobin: 10.8 g/dL — ABNORMAL LOW (ref 12.0–15.0)
MCH: 32.7 pg (ref 26.0–34.0)
MCHC: 33.2 g/dL (ref 30.0–36.0)
MCV: 98.5 fL (ref 78.0–100.0)
Platelets: 139 10*3/uL — ABNORMAL LOW (ref 150–400)
RBC: 3.3 MIL/uL — ABNORMAL LOW (ref 3.87–5.11)
RDW: 12.7 % (ref 11.5–15.5)
WBC: 11.5 10*3/uL — ABNORMAL HIGH (ref 4.0–10.5)

## 2015-08-14 LAB — BASIC METABOLIC PANEL
Anion gap: 9 (ref 5–15)
BUN: 19 mg/dL (ref 6–20)
CALCIUM: 8.5 mg/dL — AB (ref 8.9–10.3)
CO2: 20 mmol/L — AB (ref 22–32)
CREATININE: 1.66 mg/dL — AB (ref 0.44–1.00)
Chloride: 113 mmol/L — ABNORMAL HIGH (ref 101–111)
GFR calc non Af Amer: 27 mL/min — ABNORMAL LOW (ref >60)
GFR, EST AFRICAN AMERICAN: 31 mL/min — AB (ref >60)
GLUCOSE: 88 mg/dL (ref 65–99)
Potassium: 3.7 mmol/L (ref 3.5–5.1)
Sodium: 142 mmol/L (ref 135–145)

## 2015-08-14 LAB — INFLUENZA PANEL BY PCR (TYPE A & B)
H1N1FLUPCR: NOT DETECTED
Influenza A By PCR: NEGATIVE
Influenza B By PCR: NEGATIVE

## 2015-08-14 MED ORDER — SODIUM CHLORIDE 0.45 % IV SOLN
INTRAVENOUS | Status: AC
  Administered 2015-08-14: 12:00:00 via INTRAVENOUS

## 2015-08-14 NOTE — Progress Notes (Signed)
[facility] PROGRESS NOTE    Progress Note   [name] [id number] DOB: May 15, 1928 DOA: 08/13/2015 PCP: [name], MD   Brief Narrative:   [name] is an 82 y.o. female past medical history of bladder cancer status post cystectomy with diverting urostomy that comes in for confusion and fever at home.  Assessment/Plan:   Acute encephalopathy: Now resolved likely due to infectious etiology.  Fever/UTI (lower urinary tract infection): She's had no more fever since yesterday, will continue IV Rocephin DC her Tamiflu as her influenza PCR was negative. Urine cultures are pending. Appendicitis tolerated.  Acute renal failure ([facility]) Improving with IV hydration likely prerenal in etiology. Change IV fluids to half-normal saline recheck a basic metabolic panel in the morning.  Essential Hypertension: Continue hold antihypertensive medication Blood pressure seems to be stable.    DVT Prophylaxis - Lovenox ordered.  Family Communication: daughter Disposition Plan: Home 2-3 days Code Status:     Code Status Orders        Start     Ordered   08/13/15 1356  Full code   Continuous     08/13/15 1359    Code Status History    Date Active Date Inactive Code Status Order ID Comments User Context   05/11/2012  1:57 PM 05/14/2012  1:48 PM Full Code [id number]  Venetia Maxon Rama, MD Inpatient    Advance Directive Documentation        Most Recent Value   Type of Advance Directive  Living will, Healthcare Power of Attorney   Pre-existing out of facility DNR order (yellow form or pink MOST form)     "MOST" Form in Place?          IV Access:    Peripheral IV   Procedures and diagnostic studies:   Dg Chest 2 View  08/13/2015  CLINICAL DATA:  Shortness of breath, fever today. EXAM: CHEST  2 VIEW COMPARISON:  05/10/2012 FINDINGS: Biapical pleural/ parenchymal scarring. Lungs otherwise clear. Heart is normal size. No effusions or acute bony abnormality.  Diffuse aortic calcifications. Prior right shoulder replacement.  No acute bony abnormality. IMPRESSION: Biapical scarring.  No active disease. Electronically Signed   By: Rolm Baptise M.D.   On: 08/13/2015 09:17     Medical Consultants:    None.  Anti-Infectives:   Anti-infectives    Start     Dose/Rate Route Frequency Ordered Stop   08/14/15 1000  cefTRIAXone (ROCEPHIN) 1 g in dextrose 5 % 50 mL IVPB     1 g 100 mL/hr over 30 Minutes Intravenous Every 24 hours 08/13/15 1359     08/13/15 1600  oseltamivir (TAMIFLU) capsule 30 mg     30 mg Oral Daily 08/13/15 1359 08/18/15 0959   08/13/15 1030  cefTRIAXone (ROCEPHIN) 1 g in dextrose 5 % 50 mL IVPB     1 g 100 mL/hr over 30 Minutes Intravenous  Once 08/13/15 1020 08/13/15 1140   08/13/15 0000  ciprofloxacin (CIPRO) 250 MG tablet     250 mg Oral 2 times daily 08/13/15 1030        Subjective:    [place] she carry on a conversation has no new complains. She relates she would like real food.  Objective:    Filed Vitals:   08/13/15 2058 08/13/15 2253 08/14/15 0257 08/14/15 0500  BP: 99/65   132/44  Pulse: 85   82  Temp: 103 F (39.4 C) 100.1 F (37.8 C) 100.1 F (37.8  C) 98.8 F (37.1 C)  TempSrc: Oral Oral Oral Oral  Resp: 18   16  Height:      Weight:      SpO2: 98%   98%    Intake/Output Summary (Last 24 hours) at 08/14/15 1025 Last data filed at 08/14/15 1014  Gross per 24 hour  Intake 1313.75 ml  Output   1100 ml  Net 213.75 ml   Filed Weights   08/13/15 0736 08/13/15 1253  Weight: 48.081 kg (106 lb) 48.081 kg (106 lb)    Exam: Gen:  NAD, A&O x3 Cardiovascular:  RRR. Chest and lungs:   Good air movement clear to auscultation. Abdomen:  Abdomen soft, NT/ND, + BS Extremities:  No edema   Data Reviewed:    Labs: Basic Metabolic Panel:  Recent Labs Lab 08/13/15 0824 08/14/15 0335  NA 140 142  K 3.8 3.7  CL 108 113*  CO2 24 20*  GLUCOSE 106* 88  BUN 20 19  CREATININE 1.83* 1.66*    CALCIUM 9.6 8.5*   GFR Estimated Creatinine Clearance: 18.7 mL/min (by C-G formula based on Cr of 1.66). Liver Function Tests:  Recent Labs Lab 08/13/15 0824  AST 18  ALT 8*  ALKPHOS 46  BILITOT 0.7  PROT 6.9  ALBUMIN 4.0   No results for input(s): LIPASE, AMYLASE in the last 168 hours. No results for input(s): AMMONIA in the last 168 hours. Coagulation profile No results for input(s): INR, PROTIME in the last 168 hours.  CBC:  Recent Labs Lab 08/13/15 0824 08/14/15 0335  WBC 10.6* 11.5*  NEUTROABS 9.2*  --   HGB 11.5* 10.8*  HCT 33.9* 32.5*  MCV 97.1 98.5  PLT 158 139*   Cardiac Enzymes: No results for input(s): CKTOTAL, CKMB, CKMBINDEX, TROPONINI in the last 168 hours. BNP (last 3 results) No results for input(s): PROBNP in the last 8760 hours. CBG: No results for input(s): GLUCAP in the last 168 hours. D-Dimer: No results for input(s): DDIMER in the last 72 hours. Hgb A1c: No results for input(s): HGBA1C in the last 72 hours. Lipid Profile: No results for input(s): CHOL, HDL, LDLCALC, TRIG, CHOLHDL, LDLDIRECT in the last 72 hours. Thyroid function studies: No results for input(s): TSH, T4TOTAL, T3FREE, THYROIDAB in the last 72 hours.  Invalid input(s): FREET3 Anemia work up: No results for input(s): VITAMINB12, FOLATE, FERRITIN, TIBC, IRON, RETICCTPCT in the last 72 hours. Sepsis Labs:  Recent Labs Lab 08/13/15 0824 08/14/15 0335  WBC 10.6* 11.5*   Microbiology No results found for this or any previous visit (from the past 240 hour(s)).   Medications:   . cefTRIAXone (ROCEPHIN)  IV  1 g Intravenous Q24H  . donepezil  10 mg Oral QHS  . heparin  5,000 Units Subcutaneous 3 times per day  . loratadine  10 mg Oral Daily  . mometasone-formoterol  2 puff Inhalation BID  . oseltamivir  30 mg Oral Daily  . pantoprazole  40 mg Oral Daily   Continuous Infusions: . sodium chloride 75 mL/hr at 08/14/15 0030    Time spent: 25 min   LOS: 1 day    Charlynne Cousins  [facility] Pager [phone]  *Please refer to [email or web address], password [email or web address] to get updated schedule on who will round on this patient, as hospitalists switch teams weekly. If 7PM-7AM, please contact night-coverage at [email or web address], password [email or web address] for any overnight needs.  08/14/2015, 10:25 AM

## 2015-08-14 NOTE — Progress Notes (Signed)
PT Cancellation Note  Patient Details Name: [name] MRN: [id number] DOB: 06/14/1927   Cancelled Treatment:    Reason Eval/Treat Not Completed: Medical issues which prohibited therapy-increased temperature again this afternoon. Will check back on tomorrow when pt feels a little better. Thanks.    Weston Anna, MPT Pager: [phone]

## 2015-08-15 LAB — C DIFFICILE QUICK SCREEN W PCR REFLEX
C DIFFICILE (CDIFF) INTERP: NEGATIVE
C DIFFICLE (CDIFF) ANTIGEN: NEGATIVE
C Diff toxin: NEGATIVE

## 2015-08-15 LAB — BASIC METABOLIC PANEL
ANION GAP: 10 (ref 5–15)
BUN: 12 mg/dL (ref 6–20)
CALCIUM: 7.9 mg/dL — AB (ref 8.9–10.3)
CO2: 20 mmol/L — ABNORMAL LOW (ref 22–32)
Chloride: 113 mmol/L — ABNORMAL HIGH (ref 101–111)
Creatinine, Ser: 1.27 mg/dL — ABNORMAL HIGH (ref 0.44–1.00)
GFR, EST AFRICAN AMERICAN: 43 mL/min — AB (ref >60)
GFR, EST NON AFRICAN AMERICAN: 37 mL/min — AB (ref >60)
Glucose, Bld: 91 mg/dL (ref 65–99)
POTASSIUM: 3.5 mmol/L (ref 3.5–5.1)
SODIUM: 143 mmol/L (ref 135–145)

## 2015-08-15 MED ORDER — PIPERACILLIN-TAZOBACTAM 3.375 G IVPB
3.3750 g | Freq: Three times a day (TID) | INTRAVENOUS | Status: DC
  Administered 2015-08-15 – 2015-08-17 (×6): 3.375 g via INTRAVENOUS
  Filled 2015-08-15 (×6): qty 50

## 2015-08-15 NOTE — Progress Notes (Deleted)
Called several times to give report to staff at [facility], however I was unable to reach anyone.

## 2015-08-15 NOTE — Progress Notes (Signed)
On call hospitalist contacted in regards to pt and family concerns about continuing at home blood pressure medication.

## 2015-08-15 NOTE — Progress Notes (Signed)
PT Cancellation Note  Patient Details Name: [name] MRN: [id number] DOB: 10-Dec-1927   Cancelled Treatment:    Reason Eval/Treat Not Completed: Attempted PT eval. Pt declined to participate-c/o nausea. Will check back another day/time.    Weston Anna, MPT Pager: [phone]

## 2015-08-15 NOTE — Progress Notes (Signed)
[facility] PROGRESS NOTE    Progress Note   [name] [id number] DOB: [birth year] DOA: 08/13/2015 PCP: [name], MD   Brief Narrative:   [name] is an 82 y.o. female past medical history of bladder cancer status post cystectomy with diverting urostomy that comes in for confusion and fever at home.  Assessment/Plan:   Acute encephalopathy: Now resolved likely due to infectious etiology.  Fever/UTI (lower urinary tract infection): She had a temp of 100.3 on 3.30.2017, WBC cont to be high now having diarhea. Check c. Dif and change antibiotics to zosyn. DC her Tamiflu as her influenza PCR was negative. Urine cultures are pending.  tolerating her diet.  Acute renal failure ([place]) Improving with IV hydration close to baseline, likely prerenal in etiology. Change IV fluids to half-normal saline recheck a basic metabolic panel in the morning.  Essential Hypertension: Continue hold antihypertensive medication Blood pressure seems to be stable.    DVT Prophylaxis - Lovenox ordered.  Family Communication: daughter Disposition Plan: Home 2-3 days Code Status:     Code Status Orders        Start     Ordered   08/13/15 1356  Full code   Continuous     08/13/15 1359    Code Status History    Date Active Date Inactive Code Status Order ID Comments User Context   05/11/2012  1:57 PM 05/14/2012  1:48 PM Full Code [id number]  Venetia Maxon Rama, MD Inpatient    Advance Directive Documentation        Most Recent Value   Type of Advance Directive  Living will, Healthcare Power of Attorney   Pre-existing out of facility DNR order (yellow form or pink MOST form)     "MOST" Form in Place?          IV Access:    Peripheral IV   Procedures and diagnostic studies:   No results found.   Medical Consultants:    None.  Anti-Infectives:   Anti-infectives    Start     Dose/Rate Route Frequency Ordered Stop   08/14/15 1000  cefTRIAXone  (ROCEPHIN) 1 g in dextrose 5 % 50 mL IVPB     1 g 100 mL/hr over 30 Minutes Intravenous Every 24 hours 08/13/15 1359     08/13/15 1600  oseltamivir (TAMIFLU) capsule 30 mg  Status:  Discontinued     30 mg Oral Daily 08/13/15 1359 08/14/15 1029   08/13/15 1030  cefTRIAXone (ROCEPHIN) 1 g in dextrose 5 % 50 mL IVPB     1 g 100 mL/hr over 30 Minutes Intravenous  Once 08/13/15 1020 08/13/15 1140   08/13/15 0000  ciprofloxacin (CIPRO) 250 MG tablet     250 mg Oral 2 times daily 08/13/15 1030        Subjective:    [place] she relates she has had several watery stools.  Objective:    Filed Vitals:   08/14/15 2032 08/15/15 0114 08/15/15 0448 08/15/15 0915  BP: 147/55  140/71   Pulse: 75  75   Temp: 98.4 F (36.9 C) 100.3 F (37.9 C) 99.4 F (37.4 C)   TempSrc: Oral Oral Oral   Resp: 18  18   Height:      Weight:      SpO2: 99%  97% 98%    Intake/Output Summary (Last 24 hours) at 08/15/15 1210 Last data filed at 08/15/15 0840  Gross per 24 hour  Intake  755 ml  Output   1100 ml  Net   -345 ml   Filed Weights   08/13/15 0736 08/13/15 1253  Weight: 48.081 kg (106 lb) 48.081 kg (106 lb)    Exam: Gen:  NAD, A&O x3 Cardiovascular:  RRR. Chest and lungs:   Good air movement clear to auscultation. Abdomen:  Abdomen soft, NT/ND, + BS Extremities:  No edema   Data Reviewed:    Labs: Basic Metabolic Panel:  Recent Labs Lab 08/13/15 0824 08/14/15 0335 08/15/15 0338  NA 140 142 143  K 3.8 3.7 3.5  CL 108 113* 113*  CO2 24 20* 20*  GLUCOSE 106* 88 91  BUN 20 19 12   CREATININE 1.83* 1.66* 1.27*  CALCIUM 9.6 8.5* 7.9*   GFR Estimated Creatinine Clearance: 24.4 mL/min (by C-G formula based on Cr of 1.27). Liver Function Tests:  Recent Labs Lab 08/13/15 0824  AST 18  ALT 8*  ALKPHOS 46  BILITOT 0.7  PROT 6.9  ALBUMIN 4.0   No results for input(s): LIPASE, AMYLASE in the last 168 hours. No results for input(s): AMMONIA in the last 168  hours. Coagulation profile No results for input(s): INR, PROTIME in the last 168 hours.  CBC:  Recent Labs Lab 08/13/15 0824 08/14/15 0335  WBC 10.6* 11.5*  NEUTROABS 9.2*  --   HGB 11.5* 10.8*  HCT 33.9* 32.5*  MCV 97.1 98.5  PLT 158 139*   Cardiac Enzymes: No results for input(s): CKTOTAL, CKMB, CKMBINDEX, TROPONINI in the last 168 hours. BNP (last 3 results) No results for input(s): PROBNP in the last 8760 hours. CBG: No results for input(s): GLUCAP in the last 168 hours. D-Dimer: No results for input(s): DDIMER in the last 72 hours. Hgb A1c: No results for input(s): HGBA1C in the last 72 hours. Lipid Profile: No results for input(s): CHOL, HDL, LDLCALC, TRIG, CHOLHDL, LDLDIRECT in the last 72 hours. Thyroid function studies: No results for input(s): TSH, T4TOTAL, T3FREE, THYROIDAB in the last 72 hours.  Invalid input(s): FREET3 Anemia work up: No results for input(s): VITAMINB12, FOLATE, FERRITIN, TIBC, IRON, RETICCTPCT in the last 72 hours. Sepsis Labs:  Recent Labs Lab 08/13/15 0824 08/14/15 0335  WBC 10.6* 11.5*   Microbiology Recent Results (from the past 240 hour(s))  Urine culture     Status: None (Preliminary result)   Collection Time: 08/13/15  9:04 AM  Result Value Ref Range Status   Specimen Description URINE, CLEAN CATCH  Final   Special Requests NONE  Final   Culture   Final    CULTURE REINCUBATED FOR BETTER GROWTH Performed at [facility]    Report Status PENDING  Incomplete  Culture, blood (routine x 2)     Status: None (Preliminary result)   Collection Time: 08/13/15  6:25 PM  Result Value Ref Range Status   Specimen Description BLOOD RIGHT HAND  Final   Special Requests BOTTLES DRAWN AEROBIC AND ANAEROBIC  10CC  Final   Culture   Final    NO GROWTH 2 DAYS Performed at [facility]    Report Status PENDING  Incomplete  Culture, blood (routine x 2)     Status: None (Preliminary result)   Collection Time: 08/13/15  6:30  PM  Result Value Ref Range Status   Specimen Description BLOOD RIGHT ARM  Final   Special Requests BOTTLES DRAWN AEROBIC AND ANAEROBIC  10CC  Final   Culture   Final    NO GROWTH 2 DAYS Performed at [facility]    Report Status PENDING  Incomplete     Medications:   . cefTRIAXone (ROCEPHIN)  IV  1 g Intravenous Q24H  . donepezil  10 mg Oral QHS  . heparin  5,000 Units Subcutaneous 3 times per day  . loratadine  10 mg Oral Daily  . mometasone-formoterol  2 puff Inhalation BID  . pantoprazole  40 mg Oral Daily   Continuous Infusions:    Time spent: 25 min   LOS: 2 days   Charlynne Cousins  [facility] Pager [phone]  *Please refer to [email or web address], password [email or web address] to get updated schedule on who will round on this patient, as hospitalists switch teams weekly. If 7PM-7AM, please contact night-coverage at [email or web address], password [email or web address] for any overnight needs.  08/15/2015, 12:10 PM

## 2015-08-15 NOTE — Progress Notes (Signed)
PT Cancellation Note  Patient Details Name: [name] MRN: [id number] DOB: [birth year]   Cancelled Treatment:    Reason Eval/Treat Not Completed: Patient declined, no reason specified. 2nd attempt to work with pt on today. Will check back another day.   Weston Anna, MPT Pager: [phone]

## 2015-08-15 NOTE — Progress Notes (Signed)
Pharmacy Antibiotic Note  [name] is a 82 y.o. female admitted on 08/13/2015 with fever and suspected UTI. Continues to have fever and leukocytosis, also developed diarrhea. R/o C.diff and broadening abx to Zosyn per pharmacy. Influenza PCR was negative so Tamiflu is stopped.  SCr improved, likely prerenal. No hx of CKD. CrCl 8ml/min.   Plan: Zosyn 3.375g IV q8h (4 hour infusion).  Follow up renal fxn and culture results. If SCr continues to improve(CrCl stays >59ml/min), we will sign-off.     Height: 5\' 1"  (154.9 cm) Weight: 106 lb (48.081 kg) IBW/kg (Calculated) : 47.8  Temp (24hrs), Avg:99.9 F (37.7 C), Min:98.4 F (36.9 C), Max:100.8 F (38.2 C)   Recent Labs Lab 08/13/15 0824 08/14/15 0335 08/15/15 0338  WBC 10.6* 11.5*  --   CREATININE 1.83* 1.66* 1.27*    Estimated Creatinine Clearance: 24.4 mL/min (by C-G formula based on Cr of 1.27).    Allergies  Allergen Reactions  . Codeine Nausea And Vomiting  . Metoclopramide Other (See Comments)    Makes patient "climb the walls"     Antimicrobials this admission: Rocephin 3/29 >> 3/31 Zosyn 3/31 >>   Dose adjustments this admission:  Microbiology results: 3/29 BCx: ngtd 3/29 UCx: reincubated  3/31 C.diff: pending  Thank you for allowing pharmacy to be a part of this patient's care.  Romeo Rabon, PharmD, pager [phone]. 08/15/2015,12:32 PM.

## 2015-08-16 LAB — BASIC METABOLIC PANEL
ANION GAP: 10 (ref 5–15)
BUN: 9 mg/dL (ref 6–20)
CHLORIDE: 112 mmol/L — AB (ref 101–111)
CO2: 21 mmol/L — AB (ref 22–32)
Calcium: 8 mg/dL — ABNORMAL LOW (ref 8.9–10.3)
Creatinine, Ser: 1.33 mg/dL — ABNORMAL HIGH (ref 0.44–1.00)
GFR calc non Af Amer: 35 mL/min — ABNORMAL LOW (ref >60)
GFR, EST AFRICAN AMERICAN: 41 mL/min — AB (ref >60)
Glucose, Bld: 81 mg/dL (ref 65–99)
POTASSIUM: 3.2 mmol/L — AB (ref 3.5–5.1)
Sodium: 143 mmol/L (ref 135–145)

## 2015-08-16 LAB — URINE CULTURE

## 2015-08-16 MED ORDER — SODIUM CHLORIDE 0.9 % IV BOLUS (SEPSIS)
500.0000 mL | Freq: Once | INTRAVENOUS | Status: AC
  Administered 2015-08-16: 500 mL via INTRAVENOUS

## 2015-08-16 MED ORDER — POTASSIUM CHLORIDE CRYS ER 20 MEQ PO TBCR
40.0000 meq | EXTENDED_RELEASE_TABLET | Freq: Two times a day (BID) | ORAL | Status: AC
  Administered 2015-08-16 (×2): 40 meq via ORAL
  Filled 2015-08-16 (×2): qty 2

## 2015-08-16 NOTE — Progress Notes (Signed)
[facility] PROGRESS NOTE    Progress Note   [name] [id number] DOB: 1928-04-22 DOA: 08/13/2015 PCP: [name], MD   Brief Narrative:   [name] is an 82 y.o. female past medical history of bladder cancer status post cystectomy with diverting urostomy that comes in for confusion and fever at home.  Assessment/Plan:   Acute encephalopathy: Now resolved likely due to infectious etiology.  Fever/UTI (lower urinary tract infection): Has remained afebrile. Cont zosyn for 1 additional day. Check c. Dif negative. Urine cultures grew multiple bac; enterobacter, enterococcus and citrobacter  Acute renal failure ([place]) Resolved with OV hydration, KVP IV fluids. Replete K mildly low.  Essential Hypertension: Continue hold antihypertensive medication Blood pressure seems to be stable.    DVT Prophylaxis - Lovenox ordered.  Family Communication: daughter Disposition Plan: Home 2-3 days Code Status:     Code Status Orders        Start     Ordered   08/13/15 1356  Full code   Continuous     08/13/15 1359    Code Status History    Date Active Date Inactive Code Status Order ID Comments User Context   05/11/2012  1:57 PM 05/14/2012  1:48 PM Full Code [id number]  Venetia Maxon Rama, MD Inpatient    Advance Directive Documentation        Most Recent Value   Type of Advance Directive  Living will, Healthcare Power of Attorney   Pre-existing out of facility DNR order (yellow form or pink MOST form)     "MOST" Form in Place?          IV Access:    Peripheral IV   Procedures and diagnostic studies:   No results found.   Medical Consultants:    None.  Anti-Infectives:   Anti-infectives    Start     Dose/Rate Route Frequency Ordered Stop   08/15/15 1300  piperacillin-tazobactam (ZOSYN) IVPB 3.375 g     3.375 g 12.5 mL/hr over 240 Minutes Intravenous 3 times per day 08/15/15 1235     08/14/15 1000  cefTRIAXone (ROCEPHIN) 1 g in  dextrose 5 % 50 mL IVPB  Status:  Discontinued     1 g 100 mL/hr over 30 Minutes Intravenous Every 24 hours 08/13/15 1359 08/15/15 1214   08/13/15 1600  oseltamivir (TAMIFLU) capsule 30 mg  Status:  Discontinued     30 mg Oral Daily 08/13/15 1359 08/14/15 1029   08/13/15 1030  cefTRIAXone (ROCEPHIN) 1 g in dextrose 5 % 50 mL IVPB     1 g 100 mL/hr over 30 Minutes Intravenous  Once 08/13/15 1020 08/13/15 1140   08/13/15 0000  ciprofloxacin (CIPRO) 250 MG tablet     250 mg Oral 2 times daily 08/13/15 1030        Subjective:    Annasophia M Coyne watery stools resolved.  Objective:    Filed Vitals:   08/15/15 0915 08/15/15 1432 08/15/15 2209 08/16/15 0639  BP:  170/52 140/50 151/58  Pulse:  70 77 70  Temp:  99 F (37.2 C) 99.6 F (37.6 C) 98.8 F (37.1 C)  TempSrc:  Oral Oral Oral  Resp:  16 18 18   Height:      Weight:      SpO2: 98% 100% 95% 98%    Intake/Output Summary (Last 24 hours) at 08/16/15 1034 Last data filed at 08/16/15 0640  Gross per 24 hour  Intake    240 ml  Output  1000 ml  Net   -760 ml   Filed Weights   08/13/15 0736 08/13/15 1253  Weight: 48.081 kg (106 lb) 48.081 kg (106 lb)    Exam: Gen:  NAD, A&O x3 Cardiovascular:  RRR. Chest and lungs:   Good air movement clear to auscultation. Abdomen:  Abdomen soft, NT/ND, + BS Extremities:  No edema   Data Reviewed:    Labs: Basic Metabolic Panel:  Recent Labs Lab 08/13/15 0824 08/14/15 0335 08/15/15 0338 08/16/15 0400  NA 140 142 143 143  K 3.8 3.7 3.5 3.2*  CL 108 113* 113* 112*  CO2 24 20* 20* 21*  GLUCOSE 106* 88 91 81  BUN 20 19 12 9   CREATININE 1.83* 1.66* 1.27* 1.33*  CALCIUM 9.6 8.5* 7.9* 8.0*   GFR Estimated Creatinine Clearance: 23.3 mL/min (by C-G formula based on Cr of 1.33). Liver Function Tests:  Recent Labs Lab 08/13/15 0824  AST 18  ALT 8*  ALKPHOS 46  BILITOT 0.7  PROT 6.9  ALBUMIN 4.0   No results for input(s): LIPASE, AMYLASE in the last 168 hours. No  results for input(s): AMMONIA in the last 168 hours. Coagulation profile No results for input(s): INR, PROTIME in the last 168 hours.  CBC:  Recent Labs Lab 08/13/15 0824 08/14/15 0335  WBC 10.6* 11.5*  NEUTROABS 9.2*  --   HGB 11.5* 10.8*  HCT 33.9* 32.5*  MCV 97.1 98.5  PLT 158 139*   Cardiac Enzymes: No results for input(s): CKTOTAL, CKMB, CKMBINDEX, TROPONINI in the last 168 hours. BNP (last 3 results) No results for input(s): PROBNP in the last 8760 hours. CBG: No results for input(s): GLUCAP in the last 168 hours. D-Dimer: No results for input(s): DDIMER in the last 72 hours. Hgb A1c: No results for input(s): HGBA1C in the last 72 hours. Lipid Profile: No results for input(s): CHOL, HDL, LDLCALC, TRIG, CHOLHDL, LDLDIRECT in the last 72 hours. Thyroid function studies: No results for input(s): TSH, T4TOTAL, T3FREE, THYROIDAB in the last 72 hours.  Invalid input(s): FREET3 Anemia work up: No results for input(s): VITAMINB12, FOLATE, FERRITIN, TIBC, IRON, RETICCTPCT in the last 72 hours. Sepsis Labs:  Recent Labs Lab 08/13/15 0824 08/14/15 0335  WBC 10.6* 11.5*   Microbiology Recent Results (from the past 240 hour(s))  Urine culture     Status: None   Collection Time: 08/13/15  9:04 AM  Result Value Ref Range Status   Specimen Description URINE, CLEAN CATCH  Final   Special Requests NONE  Final   Culture   Final    >=100,000 COLONIES/mL CITROBACTER FREUNDII 50,000 COLONIES/mL ENTEROBACTER CLOACAE 50,000 COLONIES/mL ENTEROCOCCUS SPECIES Performed at [facility]    Report Status 08/16/2015 FINAL  Final   Organism ID, Bacteria CITROBACTER FREUNDII  Final   Organism ID, Bacteria ENTEROBACTER CLOACAE  Final   Organism ID, Bacteria ENTEROCOCCUS SPECIES  Final      Susceptibility   Citrobacter freundii - MIC*    CEFAZOLIN >=64 RESISTANT Resistant     CEFTRIAXONE 8 SENSITIVE Sensitive     CIPROFLOXACIN <=0.25 SENSITIVE Sensitive     GENTAMICIN <=1  SENSITIVE Sensitive     IMIPENEM <=0.25 SENSITIVE Sensitive     NITROFURANTOIN <=16 SENSITIVE Sensitive     TRIMETH/SULFA <=20 SENSITIVE Sensitive     PIP/TAZO <=4 SENSITIVE Sensitive     * >=100,000 COLONIES/mL CITROBACTER FREUNDII   Enterobacter cloacae - MIC*    CEFAZOLIN >=64 RESISTANT Resistant     CEFTRIAXONE 16 INTERMEDIATE Intermediate  CIPROFLOXACIN <=0.25 SENSITIVE Sensitive     GENTAMICIN <=1 SENSITIVE Sensitive     IMIPENEM 0.5 SENSITIVE Sensitive     NITROFURANTOIN 64 INTERMEDIATE Intermediate     TRIMETH/SULFA >=320 RESISTANT Resistant     PIP/TAZO 64 INTERMEDIATE Intermediate     * 50,000 COLONIES/mL ENTEROBACTER CLOACAE   Enterococcus species - MIC*    AMPICILLIN <=2 SENSITIVE Sensitive     LEVOFLOXACIN >=8 RESISTANT Resistant     NITROFURANTOIN <=16 SENSITIVE Sensitive     VANCOMYCIN <=0.5 SENSITIVE Sensitive     * 50,000 COLONIES/mL ENTEROCOCCUS SPECIES  Culture, blood (routine x 2)     Status: None (Preliminary result)   Collection Time: 08/13/15  6:25 PM  Result Value Ref Range Status   Specimen Description BLOOD RIGHT HAND  Final   Special Requests BOTTLES DRAWN AEROBIC AND ANAEROBIC  10CC  Final   Culture   Final    NO GROWTH 2 DAYS Performed at [facility]    Report Status PENDING  Incomplete  Culture, blood (routine x 2)     Status: None (Preliminary result)   Collection Time: 08/13/15  6:30 PM  Result Value Ref Range Status   Specimen Description BLOOD RIGHT ARM  Final   Special Requests BOTTLES DRAWN AEROBIC AND ANAEROBIC  10CC  Final   Culture   Final    NO GROWTH 2 DAYS Performed at [facility]    Report Status PENDING  Incomplete  C difficile quick scan w PCR reflex     Status: None   Collection Time: 08/15/15  2:03 PM  Result Value Ref Range Status   C Diff antigen NEGATIVE NEGATIVE Final   C Diff toxin NEGATIVE NEGATIVE Final   C Diff interpretation Negative for toxigenic C. difficile  Final     Medications:   .  donepezil  10 mg Oral QHS  . heparin  5,000 Units Subcutaneous 3 times per day  . loratadine  10 mg Oral Daily  . mometasone-formoterol  2 puff Inhalation BID  . pantoprazole  40 mg Oral Daily  . piperacillin-tazobactam (ZOSYN)  IV  3.375 g Intravenous 3 times per day   Continuous Infusions:    Time spent: 25 min   LOS: 3 days   Charlynne Cousins  [facility] Pager [phone]  *Please refer to [email or web address], password [email or web address] to get updated schedule on who will round on this patient, as hospitalists switch teams weekly. If 7PM-7AM, please contact night-coverage at [email or web address], password [email or web address] for any overnight needs.  08/16/2015, 10:34 AM

## 2015-08-16 NOTE — Evaluation (Signed)
Physical Therapy Evaluation Patient Details Name: [name] MRN: [id number] DOB: 1927-06-19 Today's Date: 08/16/2015   History of Present Illness  Pt admitted with acute encephalopathy:confusion acute renal failure.   Clinical Impression  Pt did very well with mobility and gait with RW as she has not been out of bed for a few days. Do not feel that she needs continued PT but would recommend increasing activity with nursing/family supervision as she recovers.     Follow Up Recommendations No PT follow up    Equipment Recommendations  None recommended by PT    Recommendations for Other Services       Precautions / Restrictions Restrictions Weight Bearing Restrictions: No      Mobility  Bed Mobility Overal bed mobility: Independent                Transfers Overall transfer level: Independent Equipment used: Rolling walker (2 wheeled)             General transfer comment: minimal use of hands on rolling walker   Ambulation/Gait Ambulation/Gait assistance: Modified independent (Device/Increase time) Ambulation Distance (Feet): 200 Feet Assistive device: Rolling walker (2 wheeled) Gait Pattern/deviations: WFL(Within Functional Limits) Gait velocity: good speed       Stairs            Wheelchair Mobility    Modified Rankin (Stroke Patients Only)       Balance Overall balance assessment: No apparent balance deficits (not formally assessed)                                           Pertinent Vitals/Pain Pain Assessment: No/denies pain    Home Living Family/patient expects to be discharged to:: Private residence Living Arrangements: Children;Other relatives (lives with son and daughter-in-law) Available Help at Discharge: Family Type of Home: House Home Access: Stairs to enter Entrance Stairs-Rails: None Technical brewer of Steps: 2 Home Layout: One level Home Equipment: Environmental consultant - 2 wheels (pt says she doesn't use  it ) Additional Comments: pt says she does housework and cooking at home and walks up and down the driveway     Prior Function Level of Independence: Independent (per patient)               Hand Dominance        Extremity/Trunk Assessment   Upper Extremity Assessment: Overall WFL for tasks assessed (decrease at end range of right shoulder from previous TSR)           Lower Extremity Assessment: Overall WFL for tasks assessed         Communication   Communication: No difficulties  Cognition Arousal/Alertness: Awake/alert Behavior During Therapy: WFL for tasks assessed/performed Overall Cognitive Status: Within Functional Limits for tasks assessed                      General Comments General comments (skin integrity, edema, etc.): pt did not lose balance with head turns, weight shift and forward reaching     Exercises        Assessment/Plan    PT Assessment Patent does not need any further PT services  PT Diagnosis     PT Problem List    PT Treatment Interventions     PT Goals (Current goals can be found in the Care Plan section)      Frequency  Barriers to discharge        Co-evaluation               End of Session Equipment Utilized During Treatment: Gait belt Activity Tolerance: Patient tolerated treatment well Patient left: in chair;with family/visitor present           Time: 1033-1050 PT Time Calculation (min) (ACUTE ONLY): 17 min   Charges:   PT Evaluation $PT Eval Low Complexity: 1 Procedure     PT G Codes:       [place] Huberty K. Owens Shark, PT  08/16/2015, 10:59 AM

## 2015-08-17 LAB — URINE CULTURE

## 2015-08-17 MED ORDER — CIPROFLOXACIN HCL 500 MG PO TABS
500.0000 mg | ORAL_TABLET | ORAL | Status: DC
  Administered 2015-08-18 – 2015-08-19 (×2): 500 mg via ORAL
  Filled 2015-08-17 (×2): qty 1

## 2015-08-17 MED ORDER — PIPERACILLIN-TAZOBACTAM 3.375 G IVPB
3.3750 g | Freq: Three times a day (TID) | INTRAVENOUS | Status: AC
  Administered 2015-08-17 (×2): 3.375 g via INTRAVENOUS
  Filled 2015-08-17 (×2): qty 50

## 2015-08-17 MED ORDER — PROCHLORPERAZINE EDISYLATE 5 MG/ML IJ SOLN
10.0000 mg | Freq: Once | INTRAMUSCULAR | Status: AC
  Administered 2015-08-17: 10 mg via INTRAVENOUS
  Filled 2015-08-17: qty 2

## 2015-08-17 MED ORDER — DIPHENOXYLATE-ATROPINE 2.5-0.025 MG/5ML PO LIQD: 5.0000 mL | Freq: Four times a day (QID) | ORAL | Status: DC

## 2015-08-17 MED ORDER — DIPHENOXYLATE-ATROPINE 2.5-0.025 MG/5ML PO LIQD
5.0000 mL | Freq: Four times a day (QID) | ORAL | Status: AC
  Administered 2015-08-17 – 2015-08-18 (×4): 5 mL via ORAL
  Filled 2015-08-17 (×4): qty 5

## 2015-08-17 NOTE — Progress Notes (Addendum)
[facility] PROGRESS NOTE    Progress Note   [name] [id number] DOB: 1927/06/27 DOA: 08/13/2015 PCP: [name], MD   Brief Narrative:   [name] is an 82 y.o. female past medical history of bladder cancer status post cystectomy with diverting urostomy that comes in for confusion and fever at home.  Assessment/Plan:   Acute encephalopathy: Now resolved likely due to infectious etiology.  Fever/UTI (lower urinary tract infection): Has remained afebrile. Changed to ciprofloxacin tomorrow and monitor for 48 hours. Check c. Dif negative. Use lomotil for diarrhea.  Acute renal failure ([place]) Resolved with IV hydration, KVO IV fluids. Replete K mildly low.  Essential Hypertension: Continue hold antihypertensive medication Blood pressure seems to be stable.    DVT Prophylaxis - Lovenox ordered.  Family Communication: daughter Disposition Plan: Home 2 days Code Status:     Code Status Orders        Start     Ordered   08/13/15 1356  Full code   Continuous     08/13/15 1359    Code Status History    Date Active Date Inactive Code Status Order ID Comments User Context   05/11/2012  1:57 PM 05/14/2012  1:48 PM Full Code [id number]  Venetia Maxon Rama, MD Inpatient    Advance Directive Documentation        Most Recent Value   Type of Advance Directive  Living will, Healthcare Power of Attorney   Pre-existing out of facility DNR order (yellow form or pink MOST form)     "MOST" Form in Place?          IV Access:    Peripheral IV   Procedures and diagnostic studies:   No results found.   Medical Consultants:    None.  Anti-Infectives:   Anti-infectives    Start     Dose/Rate Route Frequency Ordered Stop   08/15/15 1300  piperacillin-tazobactam (ZOSYN) IVPB 3.375 g     3.375 g 12.5 mL/hr over 240 Minutes Intravenous 3 times per day 08/15/15 1235     08/14/15 1000  cefTRIAXone (ROCEPHIN) 1 g in dextrose 5 % 50 mL IVPB  Status:   Discontinued     1 g 100 mL/hr over 30 Minutes Intravenous Every 24 hours 08/13/15 1359 08/15/15 1214   08/13/15 1600  oseltamivir (TAMIFLU) capsule 30 mg  Status:  Discontinued     30 mg Oral Daily 08/13/15 1359 08/14/15 1029   08/13/15 1030  cefTRIAXone (ROCEPHIN) 1 g in dextrose 5 % 50 mL IVPB     1 g 100 mL/hr over 30 Minutes Intravenous  Once 08/13/15 1020 08/13/15 1140   08/13/15 0000  ciprofloxacin (CIPRO) 250 MG tablet     250 mg Oral 2 times daily 08/13/15 1030        Subjective:    [name] started having watery stools again.  Objective:    Filed Vitals:   08/16/15 1948 08/16/15 2009 08/17/15 0454 08/17/15 0820  BP:  155/67 180/53 151/70  Pulse:  70 73 85  Temp:  98.6 F (37 C) 98.9 F (37.2 C)   TempSrc:  Oral Oral   Resp:  16 16   Height:      Weight:      SpO2: 95% 97% 98%     Intake/Output Summary (Last 24 hours) at 08/17/15 1049 Last data filed at 08/17/15 0518  Gross per 24 hour  Intake    360 ml  Output   1400  ml  Net  -1040 ml   Filed Weights   08/13/15 0736 08/13/15 1253  Weight: 48.081 kg (106 lb) 48.081 kg (106 lb)    Exam: Gen:  NAD, A&O x3 Cardiovascular:  RRR. Chest and lungs:   Good air movement clear to auscultation. Abdomen:  Abdomen soft, NT/ND, + BS Extremities:  No edema   Data Reviewed:    Labs: Basic Metabolic Panel:  Recent Labs Lab 08/13/15 0824 08/14/15 0335 08/15/15 0338 08/16/15 0400  NA 140 142 143 143  K 3.8 3.7 3.5 3.2*  CL 108 113* 113* 112*  CO2 24 20* 20* 21*  GLUCOSE 106* 88 91 81  BUN 20 19 12 9   CREATININE 1.83* 1.66* 1.27* 1.33*  CALCIUM 9.6 8.5* 7.9* 8.0*   GFR Estimated Creatinine Clearance: 23.3 mL/min (by C-G formula based on Cr of 1.33). Liver Function Tests:  Recent Labs Lab 08/13/15 0824  AST 18  ALT 8*  ALKPHOS 46  BILITOT 0.7  PROT 6.9  ALBUMIN 4.0   No results for input(s): LIPASE, AMYLASE in the last 168 hours. No results for input(s): AMMONIA in the last 168  hours. Coagulation profile No results for input(s): INR, PROTIME in the last 168 hours.  CBC:  Recent Labs Lab 08/13/15 0824 08/14/15 0335  WBC 10.6* 11.5*  NEUTROABS 9.2*  --   HGB 11.5* 10.8*  HCT 33.9* 32.5*  MCV 97.1 98.5  PLT 158 139*   Cardiac Enzymes: No results for input(s): CKTOTAL, CKMB, CKMBINDEX, TROPONINI in the last 168 hours. BNP (last 3 results) No results for input(s): PROBNP in the last 8760 hours. CBG: No results for input(s): GLUCAP in the last 168 hours. D-Dimer: No results for input(s): DDIMER in the last 72 hours. Hgb A1c: No results for input(s): HGBA1C in the last 72 hours. Lipid Profile: No results for input(s): CHOL, HDL, LDLCALC, TRIG, CHOLHDL, LDLDIRECT in the last 72 hours. Thyroid function studies: No results for input(s): TSH, T4TOTAL, T3FREE, THYROIDAB in the last 72 hours.  Invalid input(s): FREET3 Anemia work up: No results for input(s): VITAMINB12, FOLATE, FERRITIN, TIBC, IRON, RETICCTPCT in the last 72 hours. Sepsis Labs:  Recent Labs Lab 08/13/15 0824 08/14/15 0335  WBC 10.6* 11.5*   Microbiology Recent Results (from the past 240 hour(s))  Urine culture     Status: None   Collection Time: 08/13/15  9:04 AM  Result Value Ref Range Status   Specimen Description URINE, CLEAN CATCH  Final   Special Requests NONE  Final   Culture   Final    >=100,000 COLONIES/mL CITROBACTER FREUNDII 50,000 COLONIES/mL ENTEROBACTER CLOACAE 50,000 COLONIES/mL ENTEROCOCCUS SPECIES Performed at [facility]    Report Status 08/16/2015 FINAL  Final   Organism ID, Bacteria CITROBACTER FREUNDII  Final   Organism ID, Bacteria ENTEROBACTER CLOACAE  Final   Organism ID, Bacteria ENTEROCOCCUS SPECIES  Final      Susceptibility   Citrobacter freundii - MIC*    CEFAZOLIN >=64 RESISTANT Resistant     CEFTRIAXONE 8 SENSITIVE Sensitive     CIPROFLOXACIN <=0.25 SENSITIVE Sensitive     GENTAMICIN <=1 SENSITIVE Sensitive     IMIPENEM <=0.25  SENSITIVE Sensitive     NITROFURANTOIN <=16 SENSITIVE Sensitive     TRIMETH/SULFA <=20 SENSITIVE Sensitive     PIP/TAZO <=4 SENSITIVE Sensitive     * >=100,000 COLONIES/mL CITROBACTER FREUNDII   Enterobacter cloacae - MIC*    CEFAZOLIN >=64 RESISTANT Resistant     CEFTRIAXONE 16 INTERMEDIATE Intermediate  CIPROFLOXACIN <=0.25 SENSITIVE Sensitive     GENTAMICIN <=1 SENSITIVE Sensitive     IMIPENEM 0.5 SENSITIVE Sensitive     NITROFURANTOIN 64 INTERMEDIATE Intermediate     TRIMETH/SULFA >=320 RESISTANT Resistant     PIP/TAZO 64 INTERMEDIATE Intermediate     * 50,000 COLONIES/mL ENTEROBACTER CLOACAE   Enterococcus species - MIC*    AMPICILLIN <=2 SENSITIVE Sensitive     LEVOFLOXACIN >=8 RESISTANT Resistant     NITROFURANTOIN <=16 SENSITIVE Sensitive     VANCOMYCIN <=0.5 SENSITIVE Sensitive     * 50,000 COLONIES/mL ENTEROCOCCUS SPECIES  Culture, blood (routine x 2)     Status: None (Preliminary result)   Collection Time: 08/13/15  6:25 PM  Result Value Ref Range Status   Specimen Description BLOOD RIGHT HAND  Final   Special Requests BOTTLES DRAWN AEROBIC AND ANAEROBIC  10CC  Final   Culture   Final    NO GROWTH 3 DAYS Performed at [facility]    Report Status PENDING  Incomplete  Culture, blood (routine x 2)     Status: None (Preliminary result)   Collection Time: 08/13/15  6:30 PM  Result Value Ref Range Status   Specimen Description BLOOD RIGHT ARM  Final   Special Requests BOTTLES DRAWN AEROBIC AND ANAEROBIC  10CC  Final   Culture   Final    NO GROWTH 3 DAYS Performed at [facility]    Report Status PENDING  Incomplete  C difficile quick scan w PCR reflex     Status: None   Collection Time: 08/15/15  2:03 PM  Result Value Ref Range Status   C Diff antigen NEGATIVE NEGATIVE Final   C Diff toxin NEGATIVE NEGATIVE Final   C Diff interpretation Negative for toxigenic C. difficile  Final     Medications:   . donepezil  10 mg Oral QHS  . heparin   5,000 Units Subcutaneous 3 times per day  . loratadine  10 mg Oral Daily  . mometasone-formoterol  2 puff Inhalation BID  . pantoprazole  40 mg Oral Daily  . piperacillin-tazobactam (ZOSYN)  IV  3.375 g Intravenous 3 times per day   Continuous Infusions:    Time spent: 15 min   LOS: 4 days   Charlynne Cousins  [facility] Pager [phone]  *Please refer to [email or web address], password [email or web address] to get updated schedule on who will round on this patient, as hospitalists switch teams weekly. If 7PM-7AM, please contact night-coverage at [email or web address], password [email or web address] for any overnight needs.  08/17/2015, 10:49 AM

## 2015-08-17 NOTE — Progress Notes (Signed)
ON CALL PROVIDER CONTACTED ABOUT PT STATUS.   PT HAS HAD X2 WATER STOOLS DURING MORNING PT NOW EXPERIENCING N/V AND HOT FLASHES  PT STATES"I FEEL WORSE THAN I DID BEFORE I CAME".

## 2015-08-18 LAB — CULTURE, BLOOD (ROUTINE X 2)
CULTURE: NO GROWTH
Culture: NO GROWTH

## 2015-08-18 MED ORDER — CLONIDINE HCL 0.1 MG PO TABS
0.1000 mg | ORAL_TABLET | Freq: Two times a day (BID) | ORAL | Status: DC
  Administered 2015-08-18 – 2015-08-19 (×2): 0.1 mg via ORAL
  Filled 2015-08-18 (×3): qty 1

## 2015-08-18 MED ORDER — DIPHENOXYLATE-ATROPINE 2.5-0.025 MG/5ML PO LIQD: 5.0000 mL | Freq: Four times a day (QID) | ORAL | Status: DC | PRN

## 2015-08-18 MED ORDER — SACCHAROMYCES BOULARDII 250 MG PO CAPS
250.0000 mg | ORAL_CAPSULE | Freq: Two times a day (BID) | ORAL | Status: DC
  Administered 2015-08-18 – 2015-08-19 (×3): 250 mg via ORAL
  Filled 2015-08-18 (×3): qty 1

## 2015-08-18 MED ORDER — AMLODIPINE BESYLATE 10 MG PO TABS
10.0000 mg | ORAL_TABLET | Freq: Every day | ORAL | Status: DC
  Administered 2015-08-18 – 2015-08-19 (×2): 10 mg via ORAL
  Filled 2015-08-18 (×2): qty 1

## 2015-08-18 MED ORDER — POTASSIUM CHLORIDE CRYS ER 20 MEQ PO TBCR
40.0000 meq | EXTENDED_RELEASE_TABLET | Freq: Once | ORAL | Status: AC
  Administered 2015-08-18: 40 meq via ORAL
  Filled 2015-08-18: qty 2

## 2015-08-18 MED ORDER — POTASSIUM CHLORIDE CRYS ER 20 MEQ PO TBCR: 40.0000 meq | EXTENDED_RELEASE_TABLET | Freq: Two times a day (BID) | ORAL | Status: DC

## 2015-08-18 NOTE — Progress Notes (Signed)
Pt complaining about stoma burning where urostomy is, pt concerned that it is the beginning of yeast infection. On call provider notified of same.

## 2015-08-18 NOTE — Progress Notes (Signed)
[facility] PROGRESS NOTE    Progress Note   [name] [id number] DOB: 09-Aug-1927 DOA: 08/13/2015 PCP: [name], MD   Brief Narrative:   [name] is an 82 y.o. female past medical history of bladder cancer status post cystectomy with diverting urostomy that comes in for confusion and fever at home.  Assessment/Plan:   Acute encephalopathy: Now resolved likely due to infectious etiology.  Fever/UTI (lower urinary tract infection): Has remained afebrile. Changed to ciprofloxacin orally today and monitor for 24 hours. Check c. Dif negative. Diarrhea is improved use Lomotil when necessary.  Acute renal failure ([place]) Resolved with IV hydration, KVO IV fluids.   Essential Hypertension: Resume antihypertensive medications and outpatient. Blood pressure seems to be stable.    DVT Prophylaxis - Lovenox ordered.  Family Communication: daughter Disposition Plan: Home in am Code Status:     Code Status Orders        Start     Ordered   08/13/15 1356  Full code   Continuous     08/13/15 1359    Code Status History    Date Active Date Inactive Code Status Order ID Comments User Context   05/11/2012  1:57 PM 05/14/2012  1:48 PM Full Code [id number]  Venetia Maxon Rama, MD Inpatient    Advance Directive Documentation        Most Recent Value   Type of Advance Directive  Living will, Healthcare Power of Attorney   Pre-existing out of facility DNR order (yellow form or pink MOST form)     "MOST" Form in Place?          IV Access:    Peripheral IV   Procedures and diagnostic studies:   No results found.   Medical Consultants:    None.  Anti-Infectives:   Anti-infectives    Start     Dose/Rate Route Frequency Ordered Stop   08/18/15 0800  ciprofloxacin (CIPRO) tablet 500 mg     500 mg Oral Every 24 hours 08/17/15 1054     08/17/15 1400  piperacillin-tazobactam (ZOSYN) IVPB 3.375 g     3.375 g 12.5 mL/hr over 240 Minutes  Intravenous 3 times per day 08/17/15 1054 08/18/15 0143   08/15/15 1300  piperacillin-tazobactam (ZOSYN) IVPB 3.375 g  Status:  Discontinued     3.375 g 12.5 mL/hr over 240 Minutes Intravenous 3 times per day 08/15/15 1235 08/17/15 1054   08/14/15 1000  cefTRIAXone (ROCEPHIN) 1 g in dextrose 5 % 50 mL IVPB  Status:  Discontinued     1 g 100 mL/hr over 30 Minutes Intravenous Every 24 hours 08/13/15 1359 08/15/15 1214   08/13/15 1600  oseltamivir (TAMIFLU) capsule 30 mg  Status:  Discontinued     30 mg Oral Daily 08/13/15 1359 08/14/15 1029   08/13/15 1030  cefTRIAXone (ROCEPHIN) 1 g in dextrose 5 % 50 mL IVPB     1 g 100 mL/hr over 30 Minutes Intravenous  Once 08/13/15 1020 08/13/15 1140   08/13/15 0000  ciprofloxacin (CIPRO) 250 MG tablet     250 mg Oral 2 times daily 08/13/15 1030        Subjective:    [name] having less bowel movements more formed stools.  Objective:    Filed Vitals:   08/17/15 2138 08/17/15 2212 08/18/15 0607 08/18/15 0920  BP: 149/51  158/56   Pulse: 80 73 83   Temp: 98.8 F (37.1 C)  98.7 F (37.1 C)   TempSrc:  Oral  Oral   Resp: 15 16 16    Height:      Weight:      SpO2: 97% 96% 98% 97%    Intake/Output Summary (Last 24 hours) at 08/18/15 1223 Last data filed at 08/18/15 0843  Gross per 24 hour  Intake    300 ml  Output   1375 ml  Net  -1075 ml   Filed Weights   08/13/15 0736 08/13/15 1253  Weight: 48.081 kg (106 lb) 48.081 kg (106 lb)    Exam: Gen:  NAD,  Cardiovascular:  RRR. Appreciated murmurs. Chest and lungs:   Good air movement clear to auscultation. Abdomen:  Abdomen soft, NT/ND, + BS Extremities:  No edema   Data Reviewed:    Labs: Basic Metabolic Panel:  Recent Labs Lab 08/13/15 0824 08/14/15 0335 08/15/15 0338 08/16/15 0400  NA 140 142 143 143  K 3.8 3.7 3.5 3.2*  CL 108 113* 113* 112*  CO2 24 20* 20* 21*  GLUCOSE 106* 88 91 81  BUN 20 19 12 9   CREATININE 1.83* 1.66* 1.27* 1.33*  CALCIUM 9.6 8.5*  7.9* 8.0*   GFR Estimated Creatinine Clearance: 23.3 mL/min (by C-G formula based on Cr of 1.33). Liver Function Tests:  Recent Labs Lab 08/13/15 0824  AST 18  ALT 8*  ALKPHOS 46  BILITOT 0.7  PROT 6.9  ALBUMIN 4.0   No results for input(s): LIPASE, AMYLASE in the last 168 hours. No results for input(s): AMMONIA in the last 168 hours. Coagulation profile No results for input(s): INR, PROTIME in the last 168 hours.  CBC:  Recent Labs Lab 08/13/15 0824 08/14/15 0335  WBC 10.6* 11.5*  NEUTROABS 9.2*  --   HGB 11.5* 10.8*  HCT 33.9* 32.5*  MCV 97.1 98.5  PLT 158 139*   Cardiac Enzymes: No results for input(s): CKTOTAL, CKMB, CKMBINDEX, TROPONINI in the last 168 hours. BNP (last 3 results) No results for input(s): PROBNP in the last 8760 hours. CBG: No results for input(s): GLUCAP in the last 168 hours. D-Dimer: No results for input(s): DDIMER in the last 72 hours. Hgb A1c: No results for input(s): HGBA1C in the last 72 hours. Lipid Profile: No results for input(s): CHOL, HDL, LDLCALC, TRIG, CHOLHDL, LDLDIRECT in the last 72 hours. Thyroid function studies: No results for input(s): TSH, T4TOTAL, T3FREE, THYROIDAB in the last 72 hours.  Invalid input(s): FREET3 Anemia work up: No results for input(s): VITAMINB12, FOLATE, FERRITIN, TIBC, IRON, RETICCTPCT in the last 72 hours. Sepsis Labs:  Recent Labs Lab 08/13/15 0824 08/14/15 0335  WBC 10.6* 11.5*   Microbiology Recent Results (from the past 240 hour(s))  Urine culture     Status: None   Collection Time: 08/13/15  9:04 AM  Result Value Ref Range Status   Specimen Description URINE, CLEAN CATCH  Final   Special Requests NONE  Final   Culture   Final    >=100,000 COLONIES/mL CITROBACTER FREUNDII 50,000 COLONIES/mL ENTEROBACTER CLOACAE 50,000 COLONIES/mL ENTEROCOCCUS SPECIES Performed at [facility]    Report Status 08/16/2015 FINAL  Final   Organism ID, Bacteria CITROBACTER FREUNDII  Final    Organism ID, Bacteria ENTEROBACTER CLOACAE  Final   Organism ID, Bacteria ENTEROCOCCUS SPECIES  Final      Susceptibility   Citrobacter freundii - MIC*    CEFAZOLIN >=64 RESISTANT Resistant     CEFTRIAXONE 8 SENSITIVE Sensitive     CIPROFLOXACIN <=0.25 SENSITIVE Sensitive     GENTAMICIN <=1 SENSITIVE Sensitive  IMIPENEM <=0.25 SENSITIVE Sensitive     NITROFURANTOIN <=16 SENSITIVE Sensitive     TRIMETH/SULFA <=20 SENSITIVE Sensitive     PIP/TAZO <=4 SENSITIVE Sensitive     * >=100,000 COLONIES/mL CITROBACTER FREUNDII   Enterobacter cloacae - MIC*    CEFAZOLIN >=64 RESISTANT Resistant     CEFTRIAXONE 16 INTERMEDIATE Intermediate     CIPROFLOXACIN <=0.25 SENSITIVE Sensitive     GENTAMICIN <=1 SENSITIVE Sensitive     IMIPENEM 0.5 SENSITIVE Sensitive     NITROFURANTOIN 64 INTERMEDIATE Intermediate     TRIMETH/SULFA >=320 RESISTANT Resistant     PIP/TAZO 64 INTERMEDIATE Intermediate     * 50,000 COLONIES/mL ENTEROBACTER CLOACAE   Enterococcus species - MIC*    AMPICILLIN <=2 SENSITIVE Sensitive     LEVOFLOXACIN >=8 RESISTANT Resistant     NITROFURANTOIN <=16 SENSITIVE Sensitive     VANCOMYCIN <=0.5 SENSITIVE Sensitive     * 50,000 COLONIES/mL ENTEROCOCCUS SPECIES  Culture, blood (routine x 2)     Status: None   Collection Time: 08/13/15  6:25 PM  Result Value Ref Range Status   Specimen Description BLOOD RIGHT HAND  Final   Special Requests BOTTLES DRAWN AEROBIC AND ANAEROBIC  10CC  Final   Culture   Final    NO GROWTH 5 DAYS Performed at [facility]    Report Status 08/18/2015 FINAL  Final  Culture, blood (routine x 2)     Status: None   Collection Time: 08/13/15  6:30 PM  Result Value Ref Range Status   Specimen Description BLOOD RIGHT ARM  Final   Special Requests BOTTLES DRAWN AEROBIC AND ANAEROBIC  10CC  Final   Culture   Final    NO GROWTH 5 DAYS Performed at [facility]    Report Status 08/18/2015 FINAL  Final  C difficile quick scan w PCR reflex      Status: None   Collection Time: 08/15/15  2:03 PM  Result Value Ref Range Status   C Diff antigen NEGATIVE NEGATIVE Final   C Diff toxin NEGATIVE NEGATIVE Final   C Diff interpretation Negative for toxigenic C. difficile  Final  Urine culture     Status: None   Collection Time: 08/16/15 10:15 AM  Result Value Ref Range Status   Specimen Description URINE, CLEAN CATCH  Final   Special Requests NONE  Final   Culture   Final    2,000 COLONIES/mL INSIGNIFICANT GROWTH Performed at [facility]    Report Status 08/17/2015 FINAL  Final     Medications:   . ciprofloxacin  500 mg Oral Q24H  . donepezil  10 mg Oral QHS  . heparin  5,000 Units Subcutaneous 3 times per day  . loratadine  10 mg Oral Daily  . mometasone-formoterol  2 puff Inhalation BID  . pantoprazole  40 mg Oral Daily   Continuous Infusions:    Time spent: 15 min   LOS: 5 days   Charlynne Cousins  [facility] Pager [phone]  *Please refer to [email or web address], password [email or web address] to get updated schedule on who will round on this patient, as hospitalists switch teams weekly. If 7PM-7AM, please contact night-coverage at [email or web address], password [email or web address] for any overnight needs.  08/18/2015, 12:23 PM

## 2015-08-18 NOTE — Care Management Note (Signed)
Case Management Note  Patient Details  Name: LEXYS MILLINER MRN: [id number] Date of Birth: [birth year]  Subjective/Objective:     82 yo admitted with Acute Encephalopathy            Action/Plan: From home alone  Expected Discharge Date:   (unknown)               Expected Discharge Plan:  [place]  In-House Referral:     Discharge planning Services  CM Consult  Post Acute Care Choice:    Choice offered to:  Patient, Adult Children  DME Arranged:    DME Agency:     HH Arranged:  RN, PT, OT, Nurse's Aide [place] Agency:  [facility]  Status of Service:  In process, will continue to follow  Medicare Important Message Given:    Date Medicare IM Given:    Medicare IM give by:    Date Additional Medicare IM Given:    Additional Medicare Important Message give by:     If discussed at [place] of Stay Meetings, dates discussed:    Additional Comments: Family interested in [facility] services for their mother.  This CM met with pt and 2 daughters to discuss DC planning.  Choice was offered for [facility] services and [facility] was chosen.  [facility] rep contacted for referral.  No other CM needs communicated. Lynnell Catalan, RN 08/18/2015, 3:11 PM [phone]

## 2015-08-19 MED ORDER — SACCHAROMYCES BOULARDII 250 MG PO CAPS: 250.0000 mg | ORAL_CAPSULE | Freq: Two times a day (BID) | ORAL | Status: AC

## 2015-08-19 MED ORDER — CIPROFLOXACIN HCL 500 MG PO TABS: 500.0000 mg | ORAL_TABLET | ORAL | Status: DC

## 2015-08-19 NOTE — Discharge Summary (Signed)
Physician Discharge Summary  [name] [id number] DOB: [birth year] DOA: 08/13/2015  PCP: [name], MD  Admit date: 08/13/2015 Discharge date: 08/19/2015  Time spent: 35 minutes  Recommendations for Outpatient Follow-up:  1. Follow-up with primary care doctor as an outpatient.   Discharge Diagnoses:  Principal Problem:   Acute encephalopathy Active Problems:   Fever   Acute renal failure (HCC)   Hypertension   UTI (lower urinary tract infection)   Discharge Condition: stable  Diet recommendation: hear healthy  Filed Weights   08/13/15 0736 08/13/15 1253  Weight: 48.081 kg (106 lb) 48.081 kg (106 lb)    History of present illness:  82 year old with past medical history of bladder cancer status post cystectomy diverting colostomy. He treated UTIs in the past that comes in for confusion that started 4 days prior to admission.  Hospital Course:  Acute encephalopathy: Likely due to infectious etiology now resolved.  Fever due to urinary tract infection: She was started on IV Rocephin but despite this she continued having fevers slowing was changed to Zosyn. She started having diarrhea C. difficile was checked which was negative she was started on Florastor. Influenza PCR was negative and Tamiflu was DC'd which was started on admission. After switching her Zosyn she remained afebrile 48 hours she was tried on Cipro orally and monitor for 24 hours and she remained afebrile. She will continue Cipro for 4 days postdischarge.  Acute kidney injury: Likely due to prerenal etiology resolved with IV fluid hydration.  Essential hypertension: No changes were made.   Procedures:  CXR  Consultations:  none  Discharge Exam: Filed Vitals:   08/18/15 2114 08/19/15 0608  BP: 135/48 136/55  Pulse: 72 74  Temp: 99.1 F (37.3 C) 97.8 F (36.6 C)  Resp: 16 16    General: A&O x3 Cardiovascular: RRR Respiratory: good air movement CTA B/L  Discharge  Instructions   Discharge Instructions    Diet - low sodium heart healthy    Complete by:  As directed      Increase activity slowly    Complete by:  As directed           Current Discharge Medication List    START taking these medications   Details  ciprofloxacin (CIPRO) 500 MG tablet Take 1 tablet (500 mg total) by mouth daily. Qty: 10 tablet, Refills: 0    saccharomyces boulardii (FLORASTOR) 250 MG capsule Take 1 capsule (250 mg total) by mouth 2 (two) times daily. Qty: 30 capsule, Refills: 0      CONTINUE these medications which have NOT CHANGED   Details  acetaminophen (TYLENOL) 325 MG tablet Take 650 mg by mouth every 6 (six) hours as needed. PAIN    amLODipine (NORVASC) 5 MG tablet Take 10 mg by mouth daily.      budesonide-formoterol (SYMBICORT) 80-4.5 MCG/ACT inhaler Inhale 2 puffs into the lungs 2 (two) times daily as needed (shortness of breath).     cetirizine (ZYRTEC) 10 MG tablet Take 10 mg by mouth daily as needed for allergies.    clonazepam (KLONOPIN) 0.125 MG disintegrating tablet Take 0.125 mg by mouth daily as needed (anxiety).     cloNIDine (CATAPRES) 0.1 MG tablet Take 0.1 mg by mouth 2 (two) times daily.     Cranberry (AZO-CRANBERRY) 450 MG TABS Take 1 tablet by mouth 2 (two) times daily.     donepezil (ARICEPT) 10 MG tablet Take 10 mg by mouth at bedtime.    fluticasone (FLONASE) 50 MCG/ACT nasal  spray Place 2 sprays into both nostrils daily as needed for allergies or rhinitis.    meclizine (ANTIVERT) 12.5 MG tablet Take 12.5 mg by mouth 3 (three) times daily as needed for dizziness.     omeprazole (PRILOSEC) 40 MG capsule Take 40 mg by mouth every evening.    ondansetron (ZOFRAN-ODT) 4 MG disintegrating tablet Take 4 mg by mouth 2 (two) times daily as needed for nausea or vomiting.      STOP taking these medications     Calcium Carb-Cholecalciferol (CALCIUM 600 + D PO)      cephALEXin (KEFLEX) 250 MG capsule        Allergies  Allergen  Reactions  . Codeine Nausea And Vomiting  . Metoclopramide Other (See Comments)    Makes patient "climb the walls"    Follow-up Information    Follow up with [name], MD.   Specialty:  Family Medicine   Why:  Follow-up with her doctor to be rechecked later this week, take the medications as prescribed and drink plenty of fluids monitor for worsening symptoms, high fevers, vomiting   Contact information:   [place] [address] [phone]       Follow up with [place].   Contact information:   [address] [place]  [address] [phone]        The results of significant diagnostics from this hospitalization (including imaging, microbiology, ancillary and laboratory) are listed below for reference.    Significant Diagnostic Studies: Dg Chest 2 View  08/13/2015  CLINICAL DATA:  Shortness of breath, fever today. EXAM: CHEST  2 VIEW COMPARISON:  05/10/2012 FINDINGS: Biapical pleural/ parenchymal scarring. Lungs otherwise clear. Heart is normal size. No effusions or acute bony abnormality. Diffuse aortic calcifications. Prior right shoulder replacement.  No acute bony abnormality. IMPRESSION: Biapical scarring.  No active disease. Electronically Signed   By: Rolm Baptise M.D.   On: 08/13/2015 09:17    Microbiology: Recent Results (from the past 240 hour(s))  Urine culture     Status: None   Collection Time: 08/13/15  9:04 AM  Result Value Ref Range Status   Specimen Description URINE, CLEAN CATCH  Final   Special Requests NONE  Final   Culture   Final    >=100,000 COLONIES/mL CITROBACTER FREUNDII 50,000 COLONIES/mL ENTEROBACTER CLOACAE 50,000 COLONIES/mL ENTEROCOCCUS SPECIES Performed at [facility]    Report Status 08/16/2015 FINAL  Final   Organism ID, Bacteria CITROBACTER FREUNDII  Final   Organism ID, Bacteria ENTEROBACTER CLOACAE  Final   Organism ID, Bacteria ENTEROCOCCUS SPECIES  Final       Susceptibility   Citrobacter freundii - MIC*    CEFAZOLIN >=64 RESISTANT Resistant     CEFTRIAXONE 8 SENSITIVE Sensitive     CIPROFLOXACIN <=0.25 SENSITIVE Sensitive     GENTAMICIN <=1 SENSITIVE Sensitive     IMIPENEM <=0.25 SENSITIVE Sensitive     NITROFURANTOIN <=16 SENSITIVE Sensitive     TRIMETH/SULFA <=20 SENSITIVE Sensitive     PIP/TAZO <=4 SENSITIVE Sensitive     * >=100,000 COLONIES/mL CITROBACTER FREUNDII   Enterobacter cloacae - MIC*    CEFAZOLIN >=64 RESISTANT Resistant     CEFTRIAXONE 16 INTERMEDIATE Intermediate     CIPROFLOXACIN <=0.25 SENSITIVE Sensitive     GENTAMICIN <=1 SENSITIVE Sensitive     IMIPENEM 0.5 SENSITIVE Sensitive     NITROFURANTOIN 64 INTERMEDIATE Intermediate     TRIMETH/SULFA >=320 RESISTANT Resistant  PIP/TAZO 64 INTERMEDIATE Intermediate     * 50,000 COLONIES/mL ENTEROBACTER CLOACAE   Enterococcus species - MIC*    AMPICILLIN <=2 SENSITIVE Sensitive     LEVOFLOXACIN >=8 RESISTANT Resistant     NITROFURANTOIN <=16 SENSITIVE Sensitive     VANCOMYCIN <=0.5 SENSITIVE Sensitive     * 50,000 COLONIES/mL ENTEROCOCCUS SPECIES  Culture, blood (routine x 2)     Status: None   Collection Time: 08/13/15  6:25 PM  Result Value Ref Range Status   Specimen Description BLOOD RIGHT HAND  Final   Special Requests BOTTLES DRAWN AEROBIC AND ANAEROBIC  10CC  Final   Culture   Final    NO GROWTH 5 DAYS Performed at [facility]    Report Status 08/18/2015 FINAL  Final  Culture, blood (routine x 2)     Status: None   Collection Time: 08/13/15  6:30 PM  Result Value Ref Range Status   Specimen Description BLOOD RIGHT ARM  Final   Special Requests BOTTLES DRAWN AEROBIC AND ANAEROBIC  10CC  Final   Culture   Final    NO GROWTH 5 DAYS Performed at [facility]    Report Status 08/18/2015 FINAL  Final  C difficile quick scan w PCR reflex     Status: None   Collection Time: 08/15/15  2:03 PM  Result Value Ref Range Status   C Diff antigen  NEGATIVE NEGATIVE Final   C Diff toxin NEGATIVE NEGATIVE Final   C Diff interpretation Negative for toxigenic C. difficile  Final  Urine culture     Status: None   Collection Time: 08/16/15 10:15 AM  Result Value Ref Range Status   Specimen Description URINE, CLEAN CATCH  Final   Special Requests NONE  Final   Culture   Final    2,000 COLONIES/mL INSIGNIFICANT GROWTH Performed at [facility]    Report Status 08/17/2015 FINAL  Final     Labs: Basic Metabolic Panel:  Recent Labs Lab 08/13/15 0824 08/14/15 0335 08/15/15 0338 08/16/15 0400  NA 140 142 143 143  K 3.8 3.7 3.5 3.2*  CL 108 113* 113* 112*  CO2 24 20* 20* 21*  GLUCOSE 106* 88 91 81  BUN 20 19 12 9   CREATININE 1.83* 1.66* 1.27* 1.33*  CALCIUM 9.6 8.5* 7.9* 8.0*   Liver Function Tests:  Recent Labs Lab 08/13/15 0824  AST 18  ALT 8*  ALKPHOS 46  BILITOT 0.7  PROT 6.9  ALBUMIN 4.0   No results for input(s): LIPASE, AMYLASE in the last 168 hours. No results for input(s): AMMONIA in the last 168 hours. CBC:  Recent Labs Lab 08/13/15 0824 08/14/15 0335  WBC 10.6* 11.5*  NEUTROABS 9.2*  --   HGB 11.5* 10.8*  HCT 33.9* 32.5*  MCV 97.1 98.5  PLT 158 139*   Cardiac Enzymes: No results for input(s): CKTOTAL, CKMB, CKMBINDEX, TROPONINI in the last 168 hours. BNP: BNP (last 3 results) No results for input(s): BNP in the last 8760 hours.  ProBNP (last 3 results) No results for input(s): PROBNP in the last 8760 hours.  CBG: No results for input(s): GLUCAP in the last 168 hours.     Signed:  Charlynne Cousins MD.  [facility] 08/19/2015, 11:54 AM

## 2015-08-19 NOTE — Care Management Important Message (Signed)
Important Message  Patient Details  Name: [name] MRN: [id number] Date of Birth: [birth year]   Medicare Important Message Given:  Yes    Camillo Flaming 08/19/2015, 9:35 AMImportant Message  Patient Details  Name: [name] MRN: [id number] Date of Birth: 02/01/1928   Medicare Important Message Given:  Yes    Camillo Flaming 08/19/2015, 9:34 AM

## 2015-09-29 ENCOUNTER — Emergency Department (HOSPITAL_COMMUNITY): Payer: Medicare Other

## 2015-09-29 ENCOUNTER — Inpatient Hospital Stay (HOSPITAL_COMMUNITY)
Admission: EM | Admit: 2015-09-29 | Discharge: 2015-10-05 | DRG: 872 | Disposition: A | Discharge status: Home Health | Payer: Medicare Other | Attending: Internal Medicine | Admitting: Internal Medicine

## 2015-09-29 ENCOUNTER — Encounter (HOSPITAL_COMMUNITY): Payer: Self-pay | Admitting: Emergency Medicine

## 2015-09-29 DIAGNOSIS — K567 Ileus, unspecified: Secondary | ICD-10-CM | POA: Diagnosis present

## 2015-09-29 DIAGNOSIS — R109 Unspecified abdominal pain: Secondary | ICD-10-CM

## 2015-09-29 DIAGNOSIS — R110 Nausea: Secondary | ICD-10-CM

## 2015-09-29 DIAGNOSIS — N179 Acute kidney failure, unspecified: Secondary | ICD-10-CM | POA: Diagnosis present

## 2015-09-29 DIAGNOSIS — R1110 Vomiting, unspecified: Secondary | ICD-10-CM

## 2015-09-29 DIAGNOSIS — Z96619 Presence of unspecified artificial shoulder joint: Secondary | ICD-10-CM | POA: Diagnosis present

## 2015-09-29 DIAGNOSIS — Z888 Allergy status to other drugs, medicaments and biological substances status: Secondary | ICD-10-CM | POA: Diagnosis not present

## 2015-09-29 DIAGNOSIS — R140 Abdominal distension (gaseous): Secondary | ICD-10-CM

## 2015-09-29 DIAGNOSIS — Z885 Allergy status to narcotic agent status: Secondary | ICD-10-CM | POA: Diagnosis not present

## 2015-09-29 DIAGNOSIS — A419 Sepsis, unspecified organism: Principal | ICD-10-CM | POA: Diagnosis present

## 2015-09-29 DIAGNOSIS — E876 Hypokalemia: Secondary | ICD-10-CM | POA: Diagnosis present

## 2015-09-29 DIAGNOSIS — M810 Age-related osteoporosis without current pathological fracture: Secondary | ICD-10-CM | POA: Diagnosis present

## 2015-09-29 DIAGNOSIS — Z79899 Other long term (current) drug therapy: Secondary | ICD-10-CM

## 2015-09-29 DIAGNOSIS — K5660 Unspecified intestinal obstruction: Secondary | ICD-10-CM | POA: Diagnosis present

## 2015-09-29 DIAGNOSIS — N390 Urinary tract infection, site not specified: Secondary | ICD-10-CM

## 2015-09-29 DIAGNOSIS — I10 Essential (primary) hypertension: Secondary | ICD-10-CM | POA: Diagnosis present

## 2015-09-29 DIAGNOSIS — F0390 Unspecified dementia without behavioral disturbance: Secondary | ICD-10-CM | POA: Diagnosis present

## 2015-09-29 DIAGNOSIS — Z906 Acquired absence of other parts of urinary tract: Secondary | ICD-10-CM | POA: Diagnosis not present

## 2015-09-29 DIAGNOSIS — Z8249 Family history of ischemic heart disease and other diseases of the circulatory system: Secondary | ICD-10-CM

## 2015-09-29 DIAGNOSIS — Z8551 Personal history of malignant neoplasm of bladder: Secondary | ICD-10-CM | POA: Diagnosis not present

## 2015-09-29 DIAGNOSIS — R509 Fever, unspecified: Secondary | ICD-10-CM

## 2015-09-29 DIAGNOSIS — R112 Nausea with vomiting, unspecified: Secondary | ICD-10-CM | POA: Diagnosis present

## 2015-09-29 DIAGNOSIS — Z9049 Acquired absence of other specified parts of digestive tract: Secondary | ICD-10-CM

## 2015-09-29 DIAGNOSIS — Z936 Other artificial openings of urinary tract status: Secondary | ICD-10-CM | POA: Diagnosis not present

## 2015-09-29 DIAGNOSIS — Z87442 Personal history of urinary calculi: Secondary | ICD-10-CM

## 2015-09-29 LAB — URINALYSIS, ROUTINE W REFLEX MICROSCOPIC
BILIRUBIN URINE: NEGATIVE
GLUCOSE, UA: NEGATIVE mg/dL
Ketones, ur: 15 mg/dL — AB
NITRITE: NEGATIVE
PROTEIN: 100 mg/dL — AB
SPECIFIC GRAVITY, URINE: 1.015 (ref 1.005–1.030)
pH: 7 (ref 5.0–8.0)

## 2015-09-29 LAB — PROTIME-INR
INR: 0.98 (ref 0.00–1.49)
Prothrombin Time: 13.2 seconds (ref 11.6–15.2)

## 2015-09-29 LAB — COMPREHENSIVE METABOLIC PANEL
ALBUMIN: 4.8 g/dL (ref 3.5–5.0)
ALK PHOS: 65 U/L (ref 38–126)
ALT: 9 U/L — AB (ref 14–54)
ANION GAP: 14 (ref 5–15)
AST: 23 U/L (ref 15–41)
BILIRUBIN TOTAL: 0.7 mg/dL (ref 0.3–1.2)
BUN: 20 mg/dL (ref 6–20)
CALCIUM: 11 mg/dL — AB (ref 8.9–10.3)
CO2: 26 mmol/L (ref 22–32)
CREATININE: 1.94 mg/dL — AB (ref 0.44–1.00)
Chloride: 101 mmol/L (ref 101–111)
GFR calc Af Amer: 26 mL/min — ABNORMAL LOW (ref >60)
GFR calc non Af Amer: 22 mL/min — ABNORMAL LOW (ref >60)
GLUCOSE: 117 mg/dL — AB (ref 65–99)
Potassium: 4.5 mmol/L (ref 3.5–5.1)
SODIUM: 141 mmol/L (ref 135–145)
TOTAL PROTEIN: 7.8 g/dL (ref 6.5–8.1)

## 2015-09-29 LAB — URINE MICROSCOPIC-ADD ON

## 2015-09-29 LAB — CBC
HCT: 36.7 % (ref 36.0–46.0)
Hemoglobin: 12.6 g/dL (ref 12.0–15.0)
MCH: 31.8 pg (ref 26.0–34.0)
MCHC: 34.3 g/dL (ref 30.0–36.0)
MCV: 92.7 fL (ref 78.0–100.0)
PLATELETS: 281 10*3/uL (ref 150–400)
RBC: 3.96 MIL/uL (ref 3.87–5.11)
RDW: 12.5 % (ref 11.5–15.5)
WBC: 7 10*3/uL (ref 4.0–10.5)

## 2015-09-29 LAB — LIPASE, BLOOD: Lipase: 41 U/L (ref 11–51)

## 2015-09-29 LAB — TSH: TSH: 2.568 u[IU]/mL (ref 0.350–4.500)

## 2015-09-29 LAB — I-STAT TROPONIN, ED: Troponin i, poc: 0.01 ng/mL (ref 0.00–0.08)

## 2015-09-29 LAB — I-STAT CG4 LACTIC ACID, ED: Lactic Acid, Venous: 1.76 mmol/L (ref 0.5–2.0)

## 2015-09-29 MED ORDER — SODIUM CHLORIDE 0.9 % IV SOLN
Freq: Once | INTRAVENOUS | Status: AC
  Administered 2015-09-29: 18:00:00 via INTRAVENOUS

## 2015-09-29 MED ORDER — CLONAZEPAM 0.125 MG PO TBDP: 0.1250 mg | ORAL_TABLET | Freq: Every day | ORAL | Status: DC | PRN

## 2015-09-29 MED ORDER — PANTOPRAZOLE SODIUM 40 MG PO TBEC
80.0000 mg | DELAYED_RELEASE_TABLET | Freq: Every day | ORAL | Status: DC
  Administered 2015-09-29 – 2015-10-05 (×7): 80 mg via ORAL
  Filled 2015-09-29 (×7): qty 2

## 2015-09-29 MED ORDER — AMOXICILLIN 250 MG PO CAPS
500.0000 mg | ORAL_CAPSULE | Freq: Three times a day (TID) | ORAL | Status: DC
  Administered 2015-09-29 – 2015-09-30 (×2): 500 mg via ORAL
  Filled 2015-09-29: qty 2
  Filled 2015-09-29 (×2): qty 1
  Filled 2015-09-29: qty 2

## 2015-09-29 MED ORDER — FLUTICASONE PROPIONATE 50 MCG/ACT NA SUSP
2.0000 | Freq: Every day | NASAL | Status: DC | PRN
  Filled 2015-09-29: qty 16

## 2015-09-29 MED ORDER — CIPROFLOXACIN IN D5W 400 MG/200ML IV SOLN
400.0000 mg | Freq: Two times a day (BID) | INTRAVENOUS | Status: DC
  Administered 2015-09-29: 400 mg via INTRAVENOUS
  Filled 2015-09-29 (×3): qty 200

## 2015-09-29 MED ORDER — PROCHLORPERAZINE EDISYLATE 5 MG/ML IJ SOLN
5.0000 mg | Freq: Once | INTRAMUSCULAR | Status: AC
  Administered 2015-09-29: 5 mg via INTRAVENOUS
  Filled 2015-09-29: qty 2

## 2015-09-29 MED ORDER — MORPHINE SULFATE (PF) 2 MG/ML IV SOLN
1.0000 mg | INTRAVENOUS | Status: DC | PRN
  Administered 2015-09-29 – 2015-09-30 (×3): 1 mg via INTRAVENOUS
  Filled 2015-09-29 (×3): qty 1

## 2015-09-29 MED ORDER — ONDANSETRON HCL 4 MG/2ML IJ SOLN
4.0000 mg | Freq: Four times a day (QID) | INTRAMUSCULAR | Status: DC | PRN
  Administered 2015-09-30 – 2015-10-02 (×7): 4 mg via INTRAVENOUS
  Filled 2015-09-29 (×7): qty 2

## 2015-09-29 MED ORDER — ACETAMINOPHEN 500 MG PO TABS
1000.0000 mg | ORAL_TABLET | Freq: Once | ORAL | Status: AC
  Administered 2015-09-29: 1000 mg via ORAL
  Filled 2015-09-29: qty 2

## 2015-09-29 MED ORDER — NYSTATIN 100000 UNIT/GM EX POWD
Freq: Once | CUTANEOUS | Status: DC
  Filled 2015-09-29: qty 15

## 2015-09-29 MED ORDER — HYDROCODONE-ACETAMINOPHEN 5-325 MG PO TABS
1.0000 | ORAL_TABLET | ORAL | Status: DC | PRN
  Administered 2015-10-01 (×2): 1 via ORAL
  Filled 2015-09-29 (×3): qty 1

## 2015-09-29 MED ORDER — DIATRIZOATE MEGLUMINE & SODIUM 66-10 % PO SOLN
30.0000 mL | Freq: Once | ORAL | Status: AC
  Administered 2015-09-29: 30 mL via ORAL

## 2015-09-29 MED ORDER — ACETAMINOPHEN 325 MG PO TABS: 650.0000 mg | ORAL_TABLET | Freq: Four times a day (QID) | ORAL | Status: DC | PRN

## 2015-09-29 MED ORDER — SODIUM CHLORIDE 0.9 % IV SOLN
INTRAVENOUS | Status: DC
  Administered 2015-09-29 – 2015-10-03 (×8): via INTRAVENOUS

## 2015-09-29 MED ORDER — SODIUM CHLORIDE 0.9% FLUSH
3.0000 mL | Freq: Two times a day (BID) | INTRAVENOUS | Status: DC
  Administered 2015-09-29 – 2015-10-04 (×2): 3 mL via INTRAVENOUS

## 2015-09-29 MED ORDER — ACETAMINOPHEN 325 MG PO TABS
650.0000 mg | ORAL_TABLET | Freq: Four times a day (QID) | ORAL | Status: DC | PRN
  Administered 2015-09-30: 650 mg via ORAL
  Filled 2015-09-29: qty 2

## 2015-09-29 MED ORDER — ONDANSETRON HCL 4 MG/2ML IJ SOLN
4.0000 mg | Freq: Once | INTRAMUSCULAR | Status: AC
  Administered 2015-09-29: 4 mg via INTRAVENOUS
  Filled 2015-09-29: qty 2

## 2015-09-29 MED ORDER — MECLIZINE HCL 25 MG PO TABS
12.5000 mg | ORAL_TABLET | Freq: Three times a day (TID) | ORAL | Status: DC | PRN
  Filled 2015-09-29: qty 1

## 2015-09-29 MED ORDER — ONDANSETRON HCL 4 MG PO TABS: 4.0000 mg | ORAL_TABLET | Freq: Four times a day (QID) | ORAL | Status: DC | PRN

## 2015-09-29 MED ORDER — ONDANSETRON 4 MG PO TBDP: 4.0000 mg | ORAL_TABLET | Freq: Two times a day (BID) | ORAL | Status: DC | PRN

## 2015-09-29 MED ORDER — AMLODIPINE BESYLATE 5 MG PO TABS
5.0000 mg | ORAL_TABLET | Freq: Every day | ORAL | Status: DC
  Administered 2015-09-30 – 2015-10-05 (×6): 5 mg via ORAL
  Filled 2015-09-29 (×6): qty 1

## 2015-09-29 MED ORDER — DONEPEZIL HCL 5 MG PO TABS
10.0000 mg | ORAL_TABLET | Freq: Every day | ORAL | Status: DC
  Administered 2015-09-29 – 2015-10-04 (×6): 10 mg via ORAL
  Filled 2015-09-29 (×6): qty 2

## 2015-09-29 MED ORDER — SODIUM CHLORIDE 0.9 % IV BOLUS (SEPSIS)
1000.0000 mL | Freq: Once | INTRAVENOUS | Status: AC
  Administered 2015-09-29: 1000 mL via INTRAVENOUS

## 2015-09-29 MED ORDER — CRANBERRY 450 MG PO TABS: 1.0000 | ORAL_TABLET | Freq: Two times a day (BID) | ORAL | Status: DC

## 2015-09-29 MED ORDER — FENTANYL CITRATE (PF) 100 MCG/2ML IJ SOLN
50.0000 ug | Freq: Once | INTRAMUSCULAR | Status: AC
  Administered 2015-09-29: 50 ug via INTRAVENOUS
  Filled 2015-09-29: qty 2

## 2015-09-29 MED ORDER — CLONIDINE HCL 0.1 MG PO TABS
0.1000 mg | ORAL_TABLET | Freq: Two times a day (BID) | ORAL | Status: DC
  Administered 2015-09-29 – 2015-10-05 (×12): 0.1 mg via ORAL
  Filled 2015-09-29 (×12): qty 1

## 2015-09-29 MED ORDER — ONDANSETRON HCL 4 MG/2ML IJ SOLN
4.0000 mg | Freq: Once | INTRAMUSCULAR | Status: DC
  Filled 2015-09-29: qty 2

## 2015-09-29 MED ORDER — HEPARIN SODIUM (PORCINE) 5000 UNIT/ML IJ SOLN
5000.0000 [IU] | Freq: Three times a day (TID) | INTRAMUSCULAR | Status: DC
  Administered 2015-09-29 – 2015-10-05 (×17): 5000 [IU] via SUBCUTANEOUS
  Filled 2015-09-29 (×16): qty 1

## 2015-09-29 MED ORDER — ONDANSETRON HCL 4 MG/2ML IJ SOLN
4.0000 mg | Freq: Once | INTRAMUSCULAR | Status: AC
  Administered 2015-09-29: 4 mg via INTRAVENOUS

## 2015-09-29 MED ORDER — ACETAMINOPHEN 650 MG RE SUPP: 650.0000 mg | Freq: Four times a day (QID) | RECTAL | Status: DC | PRN

## 2015-09-29 MED ORDER — SACCHAROMYCES BOULARDII 250 MG PO CAPS
250.0000 mg | ORAL_CAPSULE | Freq: Two times a day (BID) | ORAL | Status: DC
  Administered 2015-09-29 – 2015-10-05 (×12): 250 mg via ORAL
  Filled 2015-09-29 (×12): qty 1

## 2015-09-29 NOTE — ED Notes (Signed)
Urine collected from urostomy bag. Existing bag changed to new bag prior to collection.

## 2015-09-29 NOTE — ED Provider Notes (Signed)
CSN: [id number]     Arrival date & time 09/29/15  1108 History   First MD Initiated Contact with Patient 09/29/15 1224     Chief Complaint  Patient presents with  . Emesis  . Nausea     (Consider location/radiation/quality/duration/timing/severity/associated sxs/prior Treatment) HPI [name] is a 82 y.o. female history of bladder cancer 2008 status post urostomy, dementia, recurrent UTI, comes in for evaluation of nausea and vomiting. Patient's daughter and daughter-in-law at bedside. They report taking patient to PCP yesterday, diagnosed UTI and given a "shot of antibiotic". They're unsure what kind. Patient is on chronic trimethoprim sulfa antibiotic therapy. Denies any fevers, chills, abdominal pain, back pain, unusual confusion. Nothing makes problem better or worse. No other modifying factors.  Past Medical History  Diagnosis Date  . Bladder cancer ([place])   . Kidney stones   . Hypertension   . Dementia   . Osteoporosis   . UTI (lower urinary tract infection)    Past Surgical History  Procedure Laterality Date  . Bladder removed due to cancer     . Revision urostomy cutaneous    . Total shoulder arthroplasty    . Cholecystectomy    . Abdominal hysterectomy    . Appendectomy     Family History  Problem Relation Age of Onset  . Other Mother   . Heart attack Father    Social History  Substance Use Topics  . Smoking status: Never Smoker   . Smokeless tobacco: Never Used  . Alcohol Use: No   OB History    No data available     Review of Systems A 10 point review of systems was completed and was negative except for pertinent positives and negatives as mentioned in the history of present illness     Allergies  Codeine and Metoclopramide  Home Medications   Prior to Admission medications   Medication Sig Start Date End Date Taking? Authorizing Provider  acetaminophen (TYLENOL) 325 MG tablet Take 650 mg by mouth every 6 (six) hours as needed for moderate  pain.    Yes Historical Provider, MD  amLODipine (NORVASC) 5 MG tablet Take 5 mg by mouth daily.    Yes Historical Provider, MD  budesonide-formoterol (SYMBICORT) 80-4.5 MCG/ACT inhaler Inhale 2 puffs into the lungs 2 (two) times daily as needed (shortness of breath).    Yes Historical Provider, MD  cetirizine (ZYRTEC) 10 MG tablet Take 10 mg by mouth daily as needed for allergies.   Yes Historical Provider, MD  clonazepam (KLONOPIN) 0.125 MG disintegrating tablet Take 0.125 mg by mouth daily as needed (anxiety).    Yes Historical Provider, MD  cloNIDine (CATAPRES) 0.1 MG tablet Take 0.1 mg by mouth 2 (two) times daily.    Yes Historical Provider, MD  Cranberry (AZO-CRANBERRY) 450 MG TABS Take 1 tablet by mouth 2 (two) times daily.    Yes Historical Provider, MD  donepezil (ARICEPT) 10 MG tablet Take 10 mg by mouth at bedtime.   Yes Historical Provider, MD  fluticasone (FLONASE) 50 MCG/ACT nasal spray Place 2 sprays into both nostrils daily as needed for allergies or rhinitis.   Yes Historical Provider, MD  meclizine (ANTIVERT) 12.5 MG tablet Take 12.5 mg by mouth 3 (three) times daily as needed for dizziness.  07/07/15  Yes Historical Provider, MD  nitrofurantoin, macrocrystal-monohydrate, (MACROBID) 100 MG capsule Take 100 mg by mouth daily.   Yes Historical Provider, MD  omeprazole (PRILOSEC) 40 MG capsule Take 40 mg by mouth every  evening.   Yes Historical Provider, MD  ondansetron (ZOFRAN-ODT) 4 MG disintegrating tablet Take 4 mg by mouth 2 (two) times daily as needed for nausea or vomiting.   Yes Historical Provider, MD  saccharomyces boulardii (FLORASTOR) 250 MG capsule Take 1 capsule (250 mg total) by mouth 2 (two) times daily. Patient taking differently: Take 250 mg by mouth daily.  08/19/15  Yes Charlynne Cousins, MD  ciprofloxacin (CIPRO) 500 MG tablet Take 1 tablet (500 mg total) by mouth daily. Patient not taking: Reported on 09/29/2015 08/19/15   Charlynne Cousins, MD   BP 158/70 mmHg   Pulse 102  Temp(Src) 100.4 F (38 C) (Rectal)  Resp 17  SpO2 100% Physical Exam  Constitutional: She is oriented to person, place, and time. She appears well-developed and well-nourished.  Overall well-appearing Caucasian female. Alert and oriented 4. GCS 15.  HENT:  Head: Normocephalic and atraumatic.  Mouth/Throat: Oropharynx is clear and moist.  Eyes: Conjunctivae are normal. Pupils are equal, round, and reactive to light. Right eye exhibits no discharge. Left eye exhibits no discharge. No scleral icterus.  Neck: Neck supple.  Cardiovascular: Normal rate, regular rhythm and normal heart sounds.   Pulmonary/Chest: Effort normal and breath sounds normal. No respiratory distress. She has no wheezes. She has no rales.  Abdominal: Soft. There is tenderness.  Diffuse abdominal tenderness to palpation. Urostomy bag is in place, draining appropriately. Stoma appears well, no surrounding cellulitis. Urine appears slightly cloudy.  Musculoskeletal: She exhibits no tenderness.  Neurological: She is alert and oriented to person, place, and time.  Cranial Nerves II-XII grossly intact  Skin: Skin is warm and dry. No rash noted.  Psychiatric: She has a normal mood and affect.  Nursing note and vitals reviewed.   ED Course  Procedures (including critical care time) Labs Review Labs Reviewed  COMPREHENSIVE METABOLIC PANEL - Abnormal; Notable for the following:    Glucose, Bld 117 (*)    Creatinine, Ser 1.94 (*)    Calcium 11.0 (*)    ALT 9 (*)    GFR calc non Af Amer 22 (*)    GFR calc Af Amer 26 (*)    All other components within normal limits  URINE CULTURE  LIPASE, BLOOD  CBC  URINALYSIS, ROUTINE W REFLEX MICROSCOPIC (NOT AT [facility])  I-STAT TROPOININ, ED  I-STAT CG4 LACTIC ACID, ED    Imaging Review Dg Chest 2 View  09/29/2015  CLINICAL DATA:  Nausea and vomiting.  Shortness of breath. EXAM: CHEST  2 VIEW COMPARISON:  3/29/ FINDINGS: The heart size and mediastinal contours are  within normal limits. There is no evidence of pulmonary edema, consolidation, pneumothorax, nodule or pleural fluid. The visualized skeletal structures are unremarkable. IMPRESSION: No active cardiopulmonary disease. Electronically Signed   By: Aletta Edouard M.D.   On: 09/29/2015 13:32   I have personally reviewed and evaluated these images and lab results as part of my medical decision-making.   EKG Interpretation None      MDM  Patient with urostomy secondary to bladder cancer/cystectomy 2008, chronic antibiotic therapy trimethoprim sulfa, here for evaluation of nausea and vomiting. Patient reportedly had UTI diagnosed yesterday at PCPs office and received antibiotic injection, details unknown. She appears well in the emergency department. However, she did have a fever of 100.2F, treated with Tylenol. Given 1 L NS. Labs are otherwise not concerning and she remains hemodynamically stable. Pending urinalysis, CT abdomen. Discussed with my attending, Dr. Alfonse Spruce. Signed out to oncoming provider. [place]  PA-C, for follow-up and subsequent disposition. Final diagnoses:  Intractable vomiting with nausea, vomiting of unspecified type  UTI (lower urinary tract infection)        Comer Locket, PA-C 09/30/15 [id number]  Harvel Quale, MD 10/02/15 1521

## 2015-09-29 NOTE — H&P (Signed)
History and Physical    [name] [id number] DOB: [birth year] DOA: 09/29/2015  PCP: [name], MD  Patient coming from: Home lives with her son  Chief Complaint: Nausea and vomiting  HPI: [name] is a 82 y.o. female with medical history significant of history of bladder cancer status post urostomy, she came into the hospital complaining about nausea and vomiting. She was in her usual state of health until yesterday morning when she started to have nausea vomiting, she went to her PCP at [facility] yesterday and she was given "antibiotic shot", but she continued to have vomiting so she came into the hospital for further evaluation. She denies abdominal pain, developed fever in the emergency department. ED Course: In the ED had fever of 100.4, heart rate of 115, urinalysis consistent with UTI. CT scan of abdomen pelvis showed fluid-filled small bowel loops can be inflammatory, SBO or ileus. Creatinine of 1.9.  Review of Systems:  All other systems reviewed and its negative.  Past Medical History  Diagnosis Date  . Bladder cancer ([place])   . Kidney stones   . Hypertension   . Dementia   . Osteoporosis   . UTI (lower urinary tract infection)     Past Surgical History  Procedure Laterality Date  . Bladder removed due to cancer     . Revision urostomy cutaneous    . Total shoulder arthroplasty    . Cholecystectomy    . Abdominal hysterectomy    . Appendectomy       reports that she has never smoked. She has never used smokeless tobacco. She reports that she does not drink alcohol or use illicit drugs.  Allergies  Allergen Reactions  . Codeine Nausea And Vomiting  . Metoclopramide Other (See Comments)    Makes patient "climb the walls"     Family History  Problem Relation Age of Onset  . Other Mother   . Heart attack Father    Prior to Admission medications   Medication Sig Start Date End Date Taking? Authorizing Provider  acetaminophen (TYLENOL) 325 MG  tablet Take 650 mg by mouth every 6 (six) hours as needed for moderate pain.    Yes Historical Provider, MD  amLODipine (NORVASC) 5 MG tablet Take 5 mg by mouth daily.    Yes Historical Provider, MD  budesonide-formoterol (SYMBICORT) 80-4.5 MCG/ACT inhaler Inhale 2 puffs into the lungs 2 (two) times daily as needed (shortness of breath).    Yes Historical Provider, MD  cetirizine (ZYRTEC) 10 MG tablet Take 10 mg by mouth daily as needed for allergies.   Yes Historical Provider, MD  clonazepam (KLONOPIN) 0.125 MG disintegrating tablet Take 0.125 mg by mouth daily as needed (anxiety).    Yes Historical Provider, MD  cloNIDine (CATAPRES) 0.1 MG tablet Take 0.1 mg by mouth 2 (two) times daily.    Yes Historical Provider, MD  Cranberry (AZO-CRANBERRY) 450 MG TABS Take 1 tablet by mouth 2 (two) times daily.    Yes Historical Provider, MD  donepezil (ARICEPT) 10 MG tablet Take 10 mg by mouth at bedtime.   Yes Historical Provider, MD  fluticasone (FLONASE) 50 MCG/ACT nasal spray Place 2 sprays into both nostrils daily as needed for allergies or rhinitis.   Yes Historical Provider, MD  meclizine (ANTIVERT) 12.5 MG tablet Take 12.5 mg by mouth 3 (three) times daily as needed for dizziness.  07/07/15  Yes Historical Provider, MD  nitrofurantoin, macrocrystal-monohydrate, (MACROBID) 100 MG capsule Take 100 mg by mouth daily.  Yes Historical Provider, MD  omeprazole (PRILOSEC) 40 MG capsule Take 40 mg by mouth every evening.   Yes Historical Provider, MD  ondansetron (ZOFRAN-ODT) 4 MG disintegrating tablet Take 4 mg by mouth 2 (two) times daily as needed for nausea or vomiting.   Yes Historical Provider, MD  saccharomyces boulardii (FLORASTOR) 250 MG capsule Take 1 capsule (250 mg total) by mouth 2 (two) times daily. Patient taking differently: Take 250 mg by mouth daily.  08/19/15  Yes Charlynne Cousins, MD  ciprofloxacin (CIPRO) 500 MG tablet Take 1 tablet (500 mg total) by mouth daily. Patient not taking:  Reported on 09/29/2015 08/19/15   Charlynne Cousins, MD    Physical Exam: Filed Vitals:   09/29/15 1443 09/29/15 1500 09/29/15 1643 09/29/15 1742  BP: 173/90 158/70 164/67 153/69  Pulse: 102  82 88  Temp:   98.9 F (37.2 C)   TempSrc:   Oral   Resp: 20 17 18 18   SpO2: 100%  100% 98%  Constitutional: NAD, calm, comfortable Filed Vitals:   09/29/15 1443 09/29/15 1500 09/29/15 1643 09/29/15 1742  BP: 173/90 158/70 164/67 153/69  Pulse: 102  82 88  Temp:   98.9 F (37.2 C)   TempSrc:   Oral   Resp: 20 17 18 18   SpO2: 100%  100% 98%   Eyes: PERRL, lids and conjunctivae normal ENMT: Mucous membranes are moist. Posterior pharynx clear of any exudate or lesions.Normal dentition.  Neck: normal, supple, no masses, no thyromegaly Respiratory: clear to auscultation bilaterally, no wheezing, no crackles. Normal respiratory effort. No accessory muscle use.  Cardiovascular: Regular rate and rhythm, no murmurs / rubs / gallops. No extremity edema. 2+ pedal pulses. No carotid bruits.  Abdomen: no tenderness, no masses palpated. No hepatosplenomegaly. Bowel sounds positive. Urostomy bag. Musculoskeletal: no clubbing / cyanosis. No joint deformity upper and lower extremities. Good ROM, no contractures. Normal muscle tone.  Skin: no rashes, lesions, ulcers. No induration Neurologic: CN 2-12 grossly intact. Sensation intact, DTR normal. Strength 5/5 in all 4.  Psychiatric: Normal judgment and insight. Alert and oriented x 3. Normal mood.   Labs on Admission: I have personally reviewed following labs and imaging studies  CBC:  Recent Labs Lab 09/29/15 1138  WBC 7.0  HGB 12.6  HCT 36.7  MCV 92.7  PLT [id number]   Basic Metabolic Panel:  Recent Labs Lab 09/29/15 1138  NA 141  K 4.5  CL 101  CO2 26  GLUCOSE 117*  BUN 20  CREATININE 1.94*  CALCIUM 11.0*   GFR: CrCl cannot be calculated (Unknown ideal weight.). Liver Function Tests:  Recent Labs Lab 09/29/15 1138  AST 23  ALT 9*    ALKPHOS 65  BILITOT 0.7  PROT 7.8  ALBUMIN 4.8    Recent Labs Lab 09/29/15 1138  LIPASE 41   No results for input(s): AMMONIA in the last 168 hours. Coagulation Profile: No results for input(s): INR, PROTIME in the last 168 hours. Cardiac Enzymes: No results for input(s): CKTOTAL, CKMB, CKMBINDEX, TROPONINI in the last 168 hours. BNP (last 3 results) No results for input(s): PROBNP in the last 8760 hours. HbA1C: No results for input(s): HGBA1C in the last 72 hours. CBG: No results for input(s): GLUCAP in the last 168 hours. Lipid Profile: No results for input(s): CHOL, HDL, LDLCALC, TRIG, CHOLHDL, LDLDIRECT in the last 72 hours. Thyroid Function Tests: No results for input(s): TSH, T4TOTAL, FREET4, T3FREE, THYROIDAB in the last 72 hours. Anemia Panel: No results for  input(s): VITAMINB12, FOLATE, FERRITIN, TIBC, IRON, RETICCTPCT in the last 72 hours. Urine analysis:    Component Value Date/Time   COLORURINE YELLOW 09/29/2015 1522   APPEARANCEUR CLOUDY* 09/29/2015 1522   LABSPEC 1.015 09/29/2015 1522   PHURINE 7.0 09/29/2015 1522   GLUCOSEU NEGATIVE 09/29/2015 1522   HGBUR TRACE* 09/29/2015 1522   BILIRUBINUR NEGATIVE 09/29/2015 1522   KETONESUR 15* 09/29/2015 1522   PROTEINUR 100* 09/29/2015 1522   UROBILINOGEN 0.2 05/10/2012 1123   NITRITE NEGATIVE 09/29/2015 1522   LEUKOCYTESUR MODERATE* 09/29/2015 1522   Sepsis Labs: !!!!!!!!!!!!!!!!!!!!!!!!!!!!!!!!!!!!!!!!!!!! @LABRCNTIP (procalcitonin:4,lacticidven:4) )No results found for this or any previous visit (from the past 240 hour(s)).   Radiological Exams on Admission: Ct Abdomen Pelvis Wo Contrast  09/29/2015  CLINICAL DATA:  Nausea and vomiting for the last 2 days. EXAM: CT ABDOMEN AND PELVIS WITHOUT CONTRAST TECHNIQUE: Multidetector CT imaging of the abdomen and pelvis was performed following the standard protocol without IV contrast. COMPARISON:  09/23/2015 FINDINGS: Lower chest: The lungs are clear. The heart is  mildly enlarged. There is small pericardial effusion. There is a small hiatal hernia. Hepatobiliary: Persistent moderate intrahepatic and extrahepatic biliary dilation, status post cholecystectomy. Scattered hepatic calcified granulomas. Pancreas: No mass or inflammatory process identified on this un-enhanced exam. Spleen: Within normal limits in size. Adrenals/Urinary Tract: No evidence of urolithiasis or hydronephrosis. No definite mass visualized on this un-enhanced exam. Stable left renal cyst. Stomach/Bowel: Patient is status post cystectomy with ileal conduit. There is mild sub pathologic distention of small bowel loops within the pelvis leading to the conduit, which are fluid-filled. Vascular/Lymphatic: No pathologically enlarged lymph nodes. No evidence of abdominal aortic aneurysm. Reproductive: No mass or other significant abnormality. Other: None. Musculoskeletal:  No suspicious bone lesions identified. IMPRESSION: Status post cystectomy with still appearance of ileal conduit. Sub pathologic distention of fluid-filled small bowel loops within the pelvis, may be inflammatory, or it may represent ileus versus early/ intermittent small bowel obstruction. Small pericardial effusion. Small hiatal hernia. Persistent biliary dilation, status postcholecystectomy. Electronically Signed   By: Fidela Salisbury M.D.   On: 09/29/2015 15:56   Dg Chest 2 View  09/29/2015  CLINICAL DATA:  Nausea and vomiting.  Shortness of breath. EXAM: CHEST  2 VIEW COMPARISON:  3/29/ FINDINGS: The heart size and mediastinal contours are within normal limits. There is no evidence of pulmonary edema, consolidation, pneumothorax, nodule or pleural fluid. The visualized skeletal structures are unremarkable. IMPRESSION: No active cardiopulmonary disease. Electronically Signed   By: Aletta Edouard M.D.   On: 09/29/2015 13:32    Assessment/Plan Principal Problem:   Sepsis ([place]) Active Problems:   Fever   Acute renal failure  (HCC)   Hypertension   UTI (lower urinary tract infection)   Vomiting   Sepsis Presented with temperature of 100.4 and heart rate of 115 in the presence of UTI. Aggressively hydrated with IV fluids, started on IV antibiotics. Sepsis syndrome is likely secondary to UTI.  UTI This is complicated UTI in the presence of altered anatomy because of urostomy bag. Last admission she had multiple organisms grew from the urine including enterococcus, Enterobacter and Citrobacter. Started on Cipro for the Citrobacter and Enterobacter, amoxicillin for the enterococcus.  Acute kidney injury This is presumed to be acute, creatinine of discharge last time was 1.3, presented with creatinine of 1.9 now. Started on IV fluids, check BMP in a.m. Presumed to be secondary to sepsis syndrome.  Hypertension Continue home medications.  Vomiting with abnormal CT findings I think this is likely  secondary to the UTI and the systemic symptoms from it. The abnormal fluid-filled small bowel loops seen on the CT likely secondary to the nausea and vomiting patient had. We'll treat nausea/vomiting symptomatically with antiemetics, advance diet.   DVT prophylaxis: Subcutaneous heparin Code Status: Full code Family Communication: Plan discussed with family at bedside daughters Kathlee Nations and [place], daughter-in-law Helene Kelp Disposition Plan: Likely home Consults called: None Admission status: Inpatient, MedSurg   [facility] A MD [facility] Pager [phone]  If 7PM-7AM, please contact night-coverage [email or web address] Password [facility]  09/29/2015, 6:16 PM

## 2015-09-29 NOTE — ED Provider Notes (Signed)
5:56 PM Handoff from [place] PA-C/Dr. Alfonse Spruce at shift change. Patient is still having significant nausea and dry heaving. Given CT findings, clinical picture will admit. Dr. Alfonse Spruce discussed NG tube with patient and family but they would like to see how she does with another dose of Zofran which helped earlier.   Spoke with Dr. Hartford Poli who will see.   BP 153/69 mmHg  Pulse 88  Temp(Src) 98.9 F (37.2 C) (Oral)  Resp 18  SpO2 98%'   Carlisle Cater, PA-C 09/29/15 1759  Harvel Quale, MD 10/02/15 1520

## 2015-09-29 NOTE — Progress Notes (Signed)
PHARMACIST - PHYSICIAN ORDER COMMUNICATION  CONCERNING: P&T Medication Policy on Herbal Medications  DESCRIPTION:  This patient's order for:  Cranberry 450mg  has been noted.  This product(s) is classified as an "herbal" or natural product. Due to a lack of definitive safety studies or FDA approval, nonstandard manufacturing practices, plus the potential risk of unknown drug-drug interactions while on inpatient medications, the Pharmacy and Therapeutics Committee does not permit the use of "herbal" or natural products of this type within [place].   ACTION TAKEN: The pharmacy department is unable to verify this order at this time and your patient has been informed of this safety policy. Please reevaluate patient's clinical condition at discharge and address if the herbal or natural product(s) should be resumed at that time.   

## 2015-09-29 NOTE — ED Notes (Signed)
Pt c/o N/V since Sunday. Pt family reports green and watery emesis. Denies fever. Dx with UTI at PCP yesterday and given a shot for that. Pt took Zofran at 0900.

## 2015-09-29 NOTE — ED Notes (Signed)
Patient aware that a urine sample is needed. Once catheter bag has enough, will obtain.

## 2015-09-30 ENCOUNTER — Inpatient Hospital Stay (HOSPITAL_COMMUNITY): Payer: Medicare Other

## 2015-09-30 LAB — CBC
HCT: 32.5 % — ABNORMAL LOW (ref 36.0–46.0)
HEMOGLOBIN: 10.9 g/dL — AB (ref 12.0–15.0)
MCH: 32.2 pg (ref 26.0–34.0)
MCHC: 33.5 g/dL (ref 30.0–36.0)
MCV: 96.2 fL (ref 78.0–100.0)
PLATELETS: 218 10*3/uL (ref 150–400)
RBC: 3.38 MIL/uL — AB (ref 3.87–5.11)
RDW: 12.9 % (ref 11.5–15.5)
WBC: 7.9 10*3/uL (ref 4.0–10.5)

## 2015-09-30 LAB — BASIC METABOLIC PANEL
Anion gap: 10 (ref 5–15)
BUN: 20 mg/dL (ref 6–20)
CHLORIDE: 105 mmol/L (ref 101–111)
CO2: 25 mmol/L (ref 22–32)
CREATININE: 1.71 mg/dL — AB (ref 0.44–1.00)
Calcium: 9.3 mg/dL (ref 8.9–10.3)
GFR calc non Af Amer: 26 mL/min — ABNORMAL LOW (ref >60)
GFR, EST AFRICAN AMERICAN: 30 mL/min — AB (ref >60)
GLUCOSE: 103 mg/dL — AB (ref 65–99)
Potassium: 3.4 mmol/L — ABNORMAL LOW (ref 3.5–5.1)
Sodium: 140 mmol/L (ref 135–145)

## 2015-09-30 LAB — GLUCOSE, CAPILLARY: Glucose-Capillary: 119 mg/dL — ABNORMAL HIGH (ref 65–99)

## 2015-09-30 LAB — URINE CULTURE: Culture: NO GROWTH

## 2015-09-30 MED ORDER — PROCHLORPERAZINE EDISYLATE 5 MG/ML IJ SOLN
10.0000 mg | Freq: Four times a day (QID) | INTRAMUSCULAR | Status: DC | PRN
Start: 2015-09-30 — End: 2015-10-05
  Administered 2015-09-30 – 2015-10-02 (×4): 10 mg via INTRAVENOUS
  Filled 2015-09-30 (×4): qty 2

## 2015-09-30 MED ORDER — POTASSIUM CHLORIDE CRYS ER 20 MEQ PO TBCR
40.0000 meq | EXTENDED_RELEASE_TABLET | Freq: Four times a day (QID) | ORAL | Status: AC
  Administered 2015-09-30 (×2): 40 meq via ORAL
  Filled 2015-09-30 (×2): qty 2

## 2015-09-30 MED ORDER — PROCHLORPERAZINE EDISYLATE 5 MG/ML IJ SOLN
5.0000 mg | Freq: Once | INTRAMUSCULAR | Status: AC
  Administered 2015-09-30: 5 mg via INTRAVENOUS
  Filled 2015-09-30: qty 2

## 2015-09-30 MED ORDER — CIPROFLOXACIN IN D5W 400 MG/200ML IV SOLN
400.0000 mg | INTRAVENOUS | Status: DC
  Administered 2015-09-30 – 2015-10-01 (×2): 400 mg via INTRAVENOUS
  Filled 2015-09-30 (×2): qty 200

## 2015-09-30 MED ORDER — AMOXICILLIN 250 MG PO CAPS
500.0000 mg | ORAL_CAPSULE | Freq: Two times a day (BID) | ORAL | Status: DC
  Administered 2015-09-30 – 2015-10-01 (×2): 500 mg via ORAL
  Filled 2015-09-30 (×2): qty 2

## 2015-09-30 NOTE — Progress Notes (Signed)
Last night, patient had to page the oncall NP about nausea and vomiting. Zofran was not working very well for the patient and so on call gave one time orders of compazine. Once at 2100 and once at 0500. Also, patient and family are worried about skin around the stoma. They said it is a bright red. They would like to have something to put on it.

## 2015-09-30 NOTE — Progress Notes (Signed)
PROGRESS NOTE  [name]  [id number] DOB: 1927/10/20 DOA: 09/29/2015 PCP: [name], MD Outpatient Specialists:  Subjective: Seen with her daughter at bedside, still complaining about nausea.  Brief Narrative:  82 year old female with past medical history of bladder cancer status post urostomy came in with nausea, vomiting and fever, urinalysis consistent with UTI.  Assessment & Plan:   Principal Problem:   Sepsis ([place]) Active Problems:   Fever   Acute renal failure (HCC)   Hypertension   UTI (lower urinary tract infection)   Vomiting   Sepsis Presented with temperature of 100.4 and heart rate of 115 in the presence of UTI. Aggressively hydrated with IV fluids, started on IV antibiotics. Sepsis syndrome is likely secondary to UTI. Sepsis pathophysiology resolved after initiation of IV antibiotics and given IV fluids.  UTI This is complicated UTI in the presence of altered anatomy because of urostomy bag. Last admission she had multiple organisms grew from the urine including enterococcus, Enterobacter and Citrobacter. Started on Cipro for the Citrobacter and Enterobacter, amoxicillin for the enterococcus.  Acute kidney injury This is presumed to be acute, creatinine of discharge last time was 1.3, presented with creatinine of 1.9 now. Presumed to be secondary to sepsis syndrome. This is improved since yesterday, SCr is 1.7 today, continue IVF, check BMP in a.m.  Hypertension Continue home medications.  Vomiting with abnormal CT findings I think this is likely secondary to the UTI and the systemic symptoms from it. The abnormal fluid-filled small bowel loops seen on the CT likely secondary to the nausea and vomiting patient had. Continues to have nausea/vomiting, check abdominal x-ray.  Hypokalemia Potassium 3.4, replete with oral supplements.   DVT prophylaxis:  Code Status: Full Code Family Communication:  Disposition Plan:  Diet: Diet NPO time  specified  Consultants:   None  Procedures:   None  Antimicrobials:   Cipro and Amoxil  Objective: Filed Vitals:   09/29/15 1857 09/29/15 2015 09/29/15 2151 09/30/15 0441  BP: 154/71 151/110 161/62 156/72  Pulse: 98 87 82 102  Temp: 98.5 F (36.9 C) 100 F (37.8 C) 99.5 F (37.5 C) 99.2 F (37.3 C)  TempSrc: Axillary Oral Oral Oral  Resp: 20 24 12 20   Height:  5\' 1"  (1.549 m)    Weight:  46.6 kg (102 lb 11.8 oz)    SpO2: 99% 99% 95% 100%    Intake/Output Summary (Last 24 hours) at 09/30/15 1113 Last data filed at 09/30/15 0900  Gross per 24 hour  Intake 633.75 ml  Output    300 ml  Net 333.75 ml   Filed Weights   09/29/15 2015  Weight: 46.6 kg (102 lb 11.8 oz)    Examination: General exam: Appears calm and comfortable  Respiratory system: Clear to auscultation. Respiratory effort normal. Cardiovascular system: S1 & S2 heard, RRR. No JVD, murmurs, rubs, gallops or clicks. No pedal edema. Gastrointestinal system: Abdomen is nondistended, soft and nontender. No organomegaly or masses felt. Normal bowel sounds heard. Central nervous system: Alert and oriented. No focal neurological deficits. Extremities: Symmetric 5 x 5 power. Skin: No rashes, lesions or ulcers Psychiatry: Judgement and insight appear normal. Mood & affect appropriate.   Data Reviewed: I have personally reviewed following labs and imaging studies  CBC:  Recent Labs Lab 09/29/15 1138 09/30/15 0432  WBC 7.0 7.9  HGB 12.6 10.9*  HCT 36.7 32.5*  MCV 92.7 96.2  PLT 281 [id number]   Basic Metabolic Panel:  Recent Labs Lab 09/29/15 1138 09/30/15  0432  NA 141 140  K 4.5 3.4*  CL 101 105  CO2 26 25  GLUCOSE 117* 103*  BUN 20 20  CREATININE 1.94* 1.71*  CALCIUM 11.0* 9.3   GFR: Estimated Creatinine Clearance: 17.7 mL/min (by C-G formula based on Cr of 1.71). Liver Function Tests:  Recent Labs Lab 09/29/15 1138  AST 23  ALT 9*  ALKPHOS 65  BILITOT 0.7  PROT 7.8  ALBUMIN 4.8     Recent Labs Lab 09/29/15 1138  LIPASE 41   No results for input(s): AMMONIA in the last 168 hours. Coagulation Profile:  Recent Labs Lab 09/29/15 1944  INR 0.98   Cardiac Enzymes: No results for input(s): CKTOTAL, CKMB, CKMBINDEX, TROPONINI in the last 168 hours. BNP (last 3 results) No results for input(s): PROBNP in the last 8760 hours. HbA1C: No results for input(s): HGBA1C in the last 72 hours. CBG:  Recent Labs Lab 09/30/15 0718  GLUCAP 119*   Lipid Profile: No results for input(s): CHOL, HDL, LDLCALC, TRIG, CHOLHDL, LDLDIRECT in the last 72 hours. Thyroid Function Tests:  Recent Labs  09/29/15 1944  TSH 2.568   Anemia Panel: No results for input(s): VITAMINB12, FOLATE, FERRITIN, TIBC, IRON, RETICCTPCT in the last 72 hours. Urine analysis:    Component Value Date/Time   COLORURINE YELLOW 09/29/2015 1522   APPEARANCEUR CLOUDY* 09/29/2015 1522   LABSPEC 1.015 09/29/2015 1522   PHURINE 7.0 09/29/2015 1522   GLUCOSEU NEGATIVE 09/29/2015 1522   HGBUR TRACE* 09/29/2015 1522   BILIRUBINUR NEGATIVE 09/29/2015 1522   KETONESUR 15* 09/29/2015 1522   PROTEINUR 100* 09/29/2015 1522   UROBILINOGEN 0.2 05/10/2012 1123   NITRITE NEGATIVE 09/29/2015 1522   LEUKOCYTESUR MODERATE* 09/29/2015 1522   Sepsis Labs: @LABRCNTIP (procalcitonin:4,lacticidven:4)  )No results found for this or any previous visit (from the past 240 hour(s)).   Invalid input(s): PROCALCITONIN, LACTICACIDVEN   Radiology Studies: Ct Abdomen Pelvis Wo Contrast  09/29/2015  CLINICAL DATA:  Nausea and vomiting for the last 2 days. EXAM: CT ABDOMEN AND PELVIS WITHOUT CONTRAST TECHNIQUE: Multidetector CT imaging of the abdomen and pelvis was performed following the standard protocol without IV contrast. COMPARISON:  09/23/2015 FINDINGS: Lower chest: The lungs are clear. The heart is mildly enlarged. There is small pericardial effusion. There is a small hiatal hernia. Hepatobiliary: Persistent  moderate intrahepatic and extrahepatic biliary dilation, status post cholecystectomy. Scattered hepatic calcified granulomas. Pancreas: No mass or inflammatory process identified on this un-enhanced exam. Spleen: Within normal limits in size. Adrenals/Urinary Tract: No evidence of urolithiasis or hydronephrosis. No definite mass visualized on this un-enhanced exam. Stable left renal cyst. Stomach/Bowel: Patient is status post cystectomy with ileal conduit. There is mild sub pathologic distention of small bowel loops within the pelvis leading to the conduit, which are fluid-filled. Vascular/Lymphatic: No pathologically enlarged lymph nodes. No evidence of abdominal aortic aneurysm. Reproductive: No mass or other significant abnormality. Other: None. Musculoskeletal:  No suspicious bone lesions identified. IMPRESSION: Status post cystectomy with still appearance of ileal conduit. Sub pathologic distention of fluid-filled small bowel loops within the pelvis, may be inflammatory, or it may represent ileus versus early/ intermittent small bowel obstruction. Small pericardial effusion. Small hiatal hernia. Persistent biliary dilation, status postcholecystectomy. Electronically Signed   By: Fidela Salisbury M.D.   On: 09/29/2015 15:56   Dg Chest 2 View  09/29/2015  CLINICAL DATA:  Nausea and vomiting.  Shortness of breath. EXAM: CHEST  2 VIEW COMPARISON:  3/29/ FINDINGS: The heart size and mediastinal contours are within normal  limits. There is no evidence of pulmonary edema, consolidation, pneumothorax, nodule or pleural fluid. The visualized skeletal structures are unremarkable. IMPRESSION: No active cardiopulmonary disease. Electronically Signed   By: Aletta Edouard M.D.   On: 09/29/2015 13:32        Scheduled Meds: . amLODipine  5 mg Oral Daily  . amoxicillin  500 mg Oral Q12H  . ciprofloxacin  400 mg Intravenous Q24H  . cloNIDine  0.1 mg Oral BID  . donepezil  10 mg Oral QHS  . heparin  5,000 Units  Subcutaneous Q8H  . pantoprazole  80 mg Oral Daily  . potassium chloride  40 mEq Oral Q6H  . saccharomyces boulardii  250 mg Oral BID  . sodium chloride flush  3 mL Intravenous Q12H   Continuous Infusions: . sodium chloride 75 mL/hr at 09/30/15 0809     LOS: 1 day    Time spent: 35 minutes    Erma Raiche A, MD [facility] Pager [phone]  If 7PM-7AM, please contact night-coverage [email or web address] Password [email or web address] 09/30/2015, 11:13 AM

## 2015-10-01 DIAGNOSIS — A419 Sepsis, unspecified organism: Principal | ICD-10-CM

## 2015-10-01 DIAGNOSIS — N390 Urinary tract infection, site not specified: Secondary | ICD-10-CM

## 2015-10-01 DIAGNOSIS — N179 Acute kidney failure, unspecified: Secondary | ICD-10-CM

## 2015-10-01 LAB — CBC
HEMATOCRIT: 31.8 % — AB (ref 36.0–46.0)
HEMOGLOBIN: 10.5 g/dL — AB (ref 12.0–15.0)
MCH: 32.5 pg (ref 26.0–34.0)
MCHC: 33 g/dL (ref 30.0–36.0)
MCV: 98.5 fL (ref 78.0–100.0)
Platelets: 174 10*3/uL (ref 150–400)
RBC: 3.23 MIL/uL — ABNORMAL LOW (ref 3.87–5.11)
RDW: 13 % (ref 11.5–15.5)
WBC: 7.3 10*3/uL (ref 4.0–10.5)

## 2015-10-01 LAB — BASIC METABOLIC PANEL
ANION GAP: 7 (ref 5–15)
BUN: 17 mg/dL (ref 6–20)
CO2: 22 mmol/L (ref 22–32)
Calcium: 8.3 mg/dL — ABNORMAL LOW (ref 8.9–10.3)
Chloride: 110 mmol/L (ref 101–111)
Creatinine, Ser: 1.36 mg/dL — ABNORMAL HIGH (ref 0.44–1.00)
GFR calc Af Amer: 40 mL/min — ABNORMAL LOW (ref >60)
GFR calc non Af Amer: 34 mL/min — ABNORMAL LOW (ref >60)
GLUCOSE: 95 mg/dL (ref 65–99)
POTASSIUM: 4.3 mmol/L (ref 3.5–5.1)
Sodium: 139 mmol/L (ref 135–145)

## 2015-10-01 LAB — HEMOGLOBIN A1C
Hgb A1c MFr Bld: 5.5 % (ref 4.8–5.6)
Mean Plasma Glucose: 111 mg/dL

## 2015-10-01 LAB — MAGNESIUM: Magnesium: 1.3 mg/dL — ABNORMAL LOW (ref 1.7–2.4)

## 2015-10-01 NOTE — Progress Notes (Addendum)
PROGRESS NOTE  [name]  [id number] DOB: [birth year] DOA: 09/29/2015 PCP: [name], MD Outpatient Specialists:  Subjective: Seen with her daughter at bedside, still complaining about nausea.  Brief Narrative:  82 year old female with past medical history of bladder cancer status post urostomy came in with nausea, vomiting and fever, urinalysis consistent with UTI.  Assessment & Plan:   Sepsis Presented with temperature of 100.4 and heart rate of 115 in the presence of UTI, AKI Aggressively hydrated with IV fluids, started on IV antibiotics. -likely secondary to UTI, Sepsis pathophysiology resolved after initiation of IV antibiotics and IV fluids.  UTI-Partially treated This is complicated UTI in the presence of altered anatomy because of urostomy/h/o bladder Ca Last admission she had multiple organisms grew from the urine including enterococcus, Enterobacter and Citrobacter. -urine Cx negative now, but received Abx before cx drawn -DC Amoxicillin and continue Cipro due to symptoms  Acute kidney injury This is presumed to be acute, creatinine of discharge last time was 1.3, presented with creatinine of 1.9 Presumed to be secondary to sepsis syndrome. -improved back to 1.3 now  Hypertension Continue home medications.  Ileus -likely due to UTI/Sepsis -no further vomiting, KUB with normal bowel gas pattern -clears, advance as tol  Hypokalemia -repleted  DVT prophylaxis: Hep SQ Code Status: Full Code Family Communication: daughter in law at bedside Disposition Plan: home when improved 1-2days  Consultants:   None  Procedures:   None  Antimicrobials:   Cipro and Amoxil  Objective: Filed Vitals:   09/30/15 1346 09/30/15 1756 09/30/15 2217 10/01/15 0524  BP: 154/73  142/69 141/63  Pulse: 95  84 83  Temp: 100.1 F (37.8 C) 98.8 F (37.1 C) 98.9 F (37.2 C) 99.5 F (37.5 C)  TempSrc: Oral  Oral Oral  Resp: 20  18 16   Height:      Weight:       SpO2: 96%  96% 97%    Intake/Output Summary (Last 24 hours) at 10/01/15 1153 Last data filed at 10/01/15 0900  Gross per 24 hour  Intake   1845 ml  Output    950 ml  Net    895 ml   Filed Weights   09/29/15 2015  Weight: 46.6 kg (102 lb 11.8 oz)    Examination: General exam: frail, elderly, no distress, AAOx2 Respiratory system: Clear to auscultation. Respiratory effort normal. Cardiovascular system: S1 & S2 heard, RRR. No JVD, murmurs, rubs, gallops or clicks. No pedal edema. Gastrointestinal system: Abdomen is nondistended, soft and nontender. No organomegaly or masses felt. Normal bowel sounds, urostomy noted Central nervous system: Alert and oriented. No focal neurological deficits. Extremities: Symmetric 5 x 5 power. Skin: No rashes, lesions or ulcers Psychiatry:  appropriate.   Data Reviewed: I have personally reviewed following labs and imaging studies  CBC:  Recent Labs Lab 09/29/15 1138 09/30/15 0432 10/01/15 0442  WBC 7.0 7.9 7.3  HGB 12.6 10.9* 10.5*  HCT 36.7 32.5* 31.8*  MCV 92.7 96.2 98.5  PLT 281 218 [id number]   Basic Metabolic Panel:  Recent Labs Lab 09/29/15 1138 09/30/15 0432 10/01/15 0442  NA 141 140 139  K 4.5 3.4* 4.3  CL 101 105 110  CO2 26 25 22   GLUCOSE 117* 103* 95  BUN 20 20 17   CREATININE 1.94* 1.71* 1.36*  CALCIUM 11.0* 9.3 8.3*  MG  --   --  1.3*   GFR: Estimated Creatinine Clearance: 22.2 mL/min (by C-G formula based on Cr of 1.36). Liver Function Tests:  Recent Labs Lab 09/29/15 1138  AST 23  ALT 9*  ALKPHOS 65  BILITOT 0.7  PROT 7.8  ALBUMIN 4.8    Recent Labs Lab 09/29/15 1138  LIPASE 41   No results for input(s): AMMONIA in the last 168 hours. Coagulation Profile:  Recent Labs Lab 09/29/15 1944  INR 0.98   Cardiac Enzymes: No results for input(s): CKTOTAL, CKMB, CKMBINDEX, TROPONINI in the last 168 hours. BNP (last 3 results) No results for input(s): PROBNP in the last 8760 hours. HbA1C:  Recent  Labs  09/29/15 1944  HGBA1C 5.5   CBG:  Recent Labs Lab 09/30/15 0718  GLUCAP 119*   Lipid Profile: No results for input(s): CHOL, HDL, LDLCALC, TRIG, CHOLHDL, LDLDIRECT in the last 72 hours. Thyroid Function Tests:  Recent Labs  09/29/15 1944  TSH 2.568   Anemia Panel: No results for input(s): VITAMINB12, FOLATE, FERRITIN, TIBC, IRON, RETICCTPCT in the last 72 hours. Urine analysis:    Component Value Date/Time   COLORURINE YELLOW 09/29/2015 1522   APPEARANCEUR CLOUDY* 09/29/2015 1522   LABSPEC 1.015 09/29/2015 1522   PHURINE 7.0 09/29/2015 1522   GLUCOSEU NEGATIVE 09/29/2015 1522   HGBUR TRACE* 09/29/2015 1522   BILIRUBINUR NEGATIVE 09/29/2015 1522   KETONESUR 15* 09/29/2015 1522   PROTEINUR 100* 09/29/2015 1522   UROBILINOGEN 0.2 05/10/2012 1123   NITRITE NEGATIVE 09/29/2015 1522   LEUKOCYTESUR MODERATE* 09/29/2015 1522   Sepsis Labs: @LABRCNTIP (procalcitonin:4,lacticidven:4)  ) Recent Results (from the past 240 hour(s))  Urine culture     Status: None   Collection Time: 09/29/15  3:23 PM  Result Value Ref Range Status   Specimen Description URINE, RANDOM  Final   Special Requests NONE  Final   Culture NO GROWTH Performed at [facility]   Final   Report Status 09/30/2015 FINAL  Final     Invalid input(s): PROCALCITONIN, LACTICACIDVEN   Radiology Studies: Ct Abdomen Pelvis Wo Contrast  09/29/2015  CLINICAL DATA:  Nausea and vomiting for the last 2 days. EXAM: CT ABDOMEN AND PELVIS WITHOUT CONTRAST TECHNIQUE: Multidetector CT imaging of the abdomen and pelvis was performed following the standard protocol without IV contrast. COMPARISON:  09/23/2015 FINDINGS: Lower chest: The lungs are clear. The heart is mildly enlarged. There is small pericardial effusion. There is a small hiatal hernia. Hepatobiliary: Persistent moderate intrahepatic and extrahepatic biliary dilation, status post cholecystectomy. Scattered hepatic calcified granulomas. Pancreas:  No mass or inflammatory process identified on this un-enhanced exam. Spleen: Within normal limits in size. Adrenals/Urinary Tract: No evidence of urolithiasis or hydronephrosis. No definite mass visualized on this un-enhanced exam. Stable left renal cyst. Stomach/Bowel: Patient is status post cystectomy with ileal conduit. There is mild sub pathologic distention of small bowel loops within the pelvis leading to the conduit, which are fluid-filled. Vascular/Lymphatic: No pathologically enlarged lymph nodes. No evidence of abdominal aortic aneurysm. Reproductive: No mass or other significant abnormality. Other: None. Musculoskeletal:  No suspicious bone lesions identified. IMPRESSION: Status post cystectomy with still appearance of ileal conduit. Sub pathologic distention of fluid-filled small bowel loops within the pelvis, may be inflammatory, or it may represent ileus versus early/ intermittent small bowel obstruction. Small pericardial effusion. Small hiatal hernia. Persistent biliary dilation, status postcholecystectomy. Electronically Signed   By: Fidela Salisbury M.D.   On: 09/29/2015 15:56   Dg Chest 2 View  09/29/2015  CLINICAL DATA:  Nausea and vomiting.  Shortness of breath. EXAM: CHEST  2 VIEW COMPARISON:  3/29/ FINDINGS: The heart size and mediastinal contours  are within normal limits. There is no evidence of pulmonary edema, consolidation, pneumothorax, nodule or pleural fluid. The visualized skeletal structures are unremarkable. IMPRESSION: No active cardiopulmonary disease. Electronically Signed   By: Aletta Edouard M.D.   On: 09/29/2015 13:32   Dg Abd 2 Views  09/30/2015  CLINICAL DATA:  Abdominal distension. Inpatient. History of cystectomy and ileal conduit urinary diversion for bladder cancer. EXAM: ABDOMEN - 2 VIEW COMPARISON:  09/29/2015 CT abdomen/ pelvis. FINDINGS: Stable top-normal caliber small bowel loops in the pelvis. No dilated small bowel loops. Surgical sutures are noted in the  right lower quadrant. There is retained oral contrast in the right colon. No evidence of pneumatosis or pneumoperitoneum. Cholecystectomy clips are seen in the right upper quadrant of the abdomen. Mild-to-moderate degenerative changes in the visualized thoracolumbar spine. Clear lung bases. IMPRESSION: Nonobstructive bowel gas pattern. Top-normal caliber pelvic small bowel loops are stable. Electronically Signed   By: Ilona Sorrel M.D.   On: 09/30/2015 12:05        Scheduled Meds: . amLODipine  5 mg Oral Daily  . amoxicillin  500 mg Oral Q12H  . ciprofloxacin  400 mg Intravenous Q24H  . cloNIDine  0.1 mg Oral BID  . donepezil  10 mg Oral QHS  . heparin  5,000 Units Subcutaneous Q8H  . pantoprazole  80 mg Oral Daily  . saccharomyces boulardii  250 mg Oral BID  . sodium chloride flush  3 mL Intravenous Q12H   Continuous Infusions: . sodium chloride 75 mL/hr at 10/01/15 1034     LOS: 2 days    Time spent: 35 minutes    Domenic Polite, MD [facility] Pager [phone]  If 7PM-7AM, please contact night-coverage [email or web address] Password [facility] 10/01/2015, 11:53 AM

## 2015-10-01 NOTE — Evaluation (Signed)
Occupational Therapy Evaluation Patient Details Name: [name] MRN: [id number] DOB: April 12, 1928 Today's Date: 10/01/2015    History of Present Illness 82 y.o. female with medical history significant of history of bladder cancer status post urostomy, she came into the hospital complaining about nausea and vomiting   Clinical Impression   Pt admitted with sepsis/ UTI. Pt currently with functional limitations due to the deficits listed below (see OT Problem List). Pt will benefit from skilled OT to increase their safety and independence with ADL and functional mobility for ADL to facilitate discharge to venue listed below.      Follow Up Recommendations  Supervision/Assistance - 24 hour;Home health OT    Equipment Recommendations  None recommended by OT       Precautions / Restrictions Precautions Precautions: None      Mobility Bed Mobility Overal bed mobility: Needs Assistance Bed Mobility: Supine to Sit     Supine to sit: Min assist        Transfers Overall transfer level: Needs assistance Equipment used: 1 person hand held assist Transfers: Sit to/from Stand Sit to Stand: Min assist                   ADL Overall ADL's : Needs assistance/impaired                                       General ADL Comments: Limited ADL eval as pt became dizzy and needed to return to supine. Pts BP 148/ 88.  Encouraged pt to keep [facility] raised and not lie all the way down .               Pertinent Vitals/Pain Pain Assessment: No/denies pain        Extremity/Trunk Assessment Upper Extremity Assessment Upper Extremity Assessment: Generalized weakness           Communication Communication Communication: No difficulties   Cognition Arousal/Alertness: Lethargic Behavior During Therapy: WFL for tasks assessed/performed Overall Cognitive Status: Within Functional Limits for tasks assessed                                Home  Living Family/patient expects to be discharged to:: Private residence Living Arrangements: Children;Other relatives Available Help at Discharge: Family Type of Home: House Home Access: Stairs to enter Technical brewer of Steps: 2   Home Layout: One level     Bathroom Shower/Tub: Tub/shower unit Shower/tub characteristics: Door       Home Equipment: Kasandra Knudsen - single point          Prior Functioning/Environment Level of Independence: Independent             OT Diagnosis: Generalized weakness   OT Problem List: Decreased strength;Decreased activity tolerance;Decreased safety awareness   OT Treatment/Interventions: Self-care/ADL training;Patient/family education;DME and/or AE instruction    OT Goals(Current goals can be found in the care plan section) Acute Rehab OT Goals Patient Stated Goal: get well OT Goal Formulation: With patient Time For Goal Achievement: 10/15/15 Potential to Achieve Goals: Good ADL Goals Pt Will Perform Grooming: with supervision;standing Pt Will Perform Lower Body Bathing: with supervision;sit to/from stand Pt Will Transfer to Toilet: with supervision;regular height toilet Pt Will Perform Toileting - Clothing Manipulation and hygiene: with supervision;sit to/from stand  OT Frequency: Min 2X/week  End of Session Nurse Communication: Mobility status  Activity Tolerance: Treatment limited secondary to medical complications (Comment) Patient left: in bed;with call bell/phone within reach;with family/visitor present   Time: 1049-1103 OT Time Calculation (min): 14 min Charges:  OT General Charges $OT Visit: 1 Procedure OT Evaluation $OT Eval Moderate Complexity: 1 Procedure G-Codes:    Payton Mccallum D 10-15-15, 11:09 AM

## 2015-10-01 NOTE — Consult Note (Signed)
WOC ostomy consult note Stoma type/location: RLQ ileal conduit performed by Dr. Janice Norrie in 2008. Stomal assessment/size: 7/8 inch round when gentle traction is applied to 12 o'clock; oval at rest, red, moist, slightly budded Peristomal assessment: Peristomal moisture associated skin damage (PMASD), with superimposed fungal overgrowth circumferentially and measuring 1cm from 3-9 o'clock, 0.5cm from 9-3 o'clock.   Treatment options for stomal/peristomal skin: dust clean skin with antifungal powder, brush away excess.  Blot on top of powder with [employer] liquid skin barrier film prior to pouching with convex pouch and skin barrier ring Output clear yellow urine Ostomy pouching: 1pc.convex pouching system with skin barrier ring Education provided: Demonstration of dusting with antifungal powder and blotting with skin barrier film to daughter-in-law.   Enrolled patient in [place] program: No Patient would benefit from a dose of a PO or IV antifungal (eg., Diflucan) to assist in the resolution of this issue.  If you agree, please order. [place] nursing team will not follow, but will remain available to this patient, the nursing and medical teams.  Please re-consult if needed. Thanks, Maudie Flakes, MSN, RN, [place], Arther Abbott  Pager# [phone]

## 2015-10-01 NOTE — Evaluation (Signed)
Physical Therapy Evaluation Patient Details Name: [name] MRN: [id number] DOB: [birth year] Today's Date: 10/01/2015   History of Present Illness  82 y.o. female with medical history significant of history of bladder cancer status post urostomy, she came into the hospital complaining about nausea and vomiting. Admitted 5/16 with sepsis/uti.  Clinical Impression  The patient was not dizzy this visit. Ambulated in the room. Patient's family very supportive. Patient has had [facility] HHPT in the past. The patient will benefit from PT to address the problems listed in the note below.    Follow Up Recommendations Home health PT;Supervision/Assistance - 24 hour    Equipment Recommendations  Rolling walker with 5" wheels    Recommendations for Other Services       Precautions / Restrictions Precautions Precautions: Fall      Mobility  Bed Mobility Overal bed mobility: Needs Assistance Bed Mobility: Supine to Sit     Supine to sit: Min assist     General bed mobility comments: assist withterunk to uopright, assist legs onto bed.  Transfers Overall transfer level: Needs assistance Equipment used: Rolling walker (2 wheeled) Transfers: Sit to/from Stand Sit to Stand: Min assist         General transfer comment: cues for hand placement  Ambulation/Gait Ambulation/Gait assistance: Min assist Ambulation Distance (Feet): 35 Feet Assistive device: Rolling walker (2 wheeled) Gait Pattern/deviations: Step-to pattern;Step-through pattern;Decreased stride length     General Gait Details: moves slowly, no dizziness  Stairs            Wheelchair Mobility    Modified Rankin (Stroke Patients Only)       Balance Overall balance assessment: Needs assistance Sitting-balance support: Bilateral upper extremity supported;Feet supported Sitting balance-Leahy Scale: Good     Standing balance support: During functional activity;Bilateral upper extremity supported Standing  balance-Leahy Scale: Fair                               Pertinent Vitals/Pain Pain Assessment: No/denies pain    Home Living Family/patient expects to be discharged to:: Private residence Living Arrangements: Children;Other relatives Available Help at Discharge: Family Type of Home: House Home Access: Stairs to enter   Technical brewer of Steps: 2 Home Layout: One level Home Equipment: Cane - single point      Prior Function Level of Independence: Independent               Hand Dominance        Extremity/Trunk Assessment   Upper Extremity Assessment: Defer to OT evaluation           Lower Extremity Assessment: Generalized weakness      Cervical / Trunk Assessment: Kyphotic  Communication   Communication: No difficulties  Cognition Arousal/Alertness: Lethargic Behavior During Therapy: WFL for tasks assessed/performed Overall Cognitive Status: Within Functional Limits for tasks assessed                      General Comments      Exercises        Assessment/Plan    PT Assessment Patient needs continued PT services  PT Diagnosis Difficulty walking;Generalized weakness   PT Problem List Decreased strength;Decreased activity tolerance;Decreased mobility  PT Treatment Interventions DME instruction;Gait training;Functional mobility training;Therapeutic activities;Therapeutic exercise;Patient/family education   PT Goals (Current goals can be found in the Care Plan section) Acute Rehab PT Goals Patient Stated Goal: get well PT Goal Formulation: With patient/family  Time For Goal Achievement: 10/15/15 Potential to Achieve Goals: Good    Frequency Min 3X/week   Barriers to discharge        Co-evaluation               End of Session   Activity Tolerance: Patient limited by fatigue Patient left: in bed;with call bell/phone within reach;with nursing/sitter in room Nurse Communication: Mobility status          Time: [id number] PT Time Calculation (min) (ACUTE ONLY): 19 min   Charges:   PT Evaluation $PT Eval Low Complexity: 1 Procedure     PT G CodesClaretha Cooper 10/01/2015, 12:37 PM Tresa Endo PT [phone]

## 2015-10-02 ENCOUNTER — Inpatient Hospital Stay (HOSPITAL_COMMUNITY): Payer: Medicare Other

## 2015-10-02 LAB — CBC
HCT: 34.1 % — ABNORMAL LOW (ref 36.0–46.0)
HEMOGLOBIN: 11.3 g/dL — AB (ref 12.0–15.0)
MCH: 32.4 pg (ref 26.0–34.0)
MCHC: 33.1 g/dL (ref 30.0–36.0)
MCV: 97.7 fL (ref 78.0–100.0)
PLATELETS: 191 10*3/uL (ref 150–400)
RBC: 3.49 MIL/uL — ABNORMAL LOW (ref 3.87–5.11)
RDW: 12.9 % (ref 11.5–15.5)
WBC: 7.7 10*3/uL (ref 4.0–10.5)

## 2015-10-02 LAB — COMPREHENSIVE METABOLIC PANEL
ALBUMIN: 3.3 g/dL — AB (ref 3.5–5.0)
ALK PHOS: 49 U/L (ref 38–126)
ALT: 6 U/L — ABNORMAL LOW (ref 14–54)
ANION GAP: 6 (ref 5–15)
AST: 24 U/L (ref 15–41)
BUN: 15 mg/dL (ref 6–20)
CALCIUM: 7.8 mg/dL — AB (ref 8.9–10.3)
CHLORIDE: 107 mmol/L (ref 101–111)
CO2: 20 mmol/L — AB (ref 22–32)
Creatinine, Ser: 1.24 mg/dL — ABNORMAL HIGH (ref 0.44–1.00)
GFR calc non Af Amer: 38 mL/min — ABNORMAL LOW (ref >60)
GFR, EST AFRICAN AMERICAN: 45 mL/min — AB (ref >60)
GLUCOSE: 87 mg/dL (ref 65–99)
POTASSIUM: 4.5 mmol/L (ref 3.5–5.1)
SODIUM: 133 mmol/L — AB (ref 135–145)
Total Bilirubin: 1 mg/dL (ref 0.3–1.2)
Total Protein: 5.7 g/dL — ABNORMAL LOW (ref 6.5–8.1)

## 2015-10-02 MED ORDER — FLUCONAZOLE IN SODIUM CHLORIDE 100-0.9 MG/50ML-% IV SOLN
100.0000 mg | Freq: Once | INTRAVENOUS | Status: AC
  Administered 2015-10-02: 100 mg via INTRAVENOUS
  Filled 2015-10-02: qty 50

## 2015-10-02 MED ORDER — HYDRALAZINE HCL 20 MG/ML IJ SOLN: 10.0000 mg | Freq: Four times a day (QID) | INTRAMUSCULAR | Status: DC | PRN

## 2015-10-02 MED ORDER — DEXTROSE 5 % IV SOLN
1.0000 g | INTRAVENOUS | Status: DC
  Administered 2015-10-02 – 2015-10-04 (×3): 1 g via INTRAVENOUS
  Filled 2015-10-02 (×3): qty 10

## 2015-10-02 NOTE — Care Management Important Message (Signed)
Important Message  Patient Details  Name: ORCHID SCHMEISER MRN: [id number] Date of Birth: Mar 23, 1928   Medicare Important Message Given:  Yes    Camillo Flaming 10/02/2015, 8:54 AMImportant Message  Patient Details  Name: LEXEE BOLDON MRN: [id number] Date of Birth: 1928-01-12   Medicare Important Message Given:  Yes    Camillo Flaming 10/02/2015, 8:54 AM

## 2015-10-02 NOTE — Progress Notes (Signed)
Occupational Therapy Treatment Patient Details Name: ANDRAEA LEGGIO MRN: [id number] DOB: Feb 08, 1928 Today's Date: 10/02/2015    History of present illness 82 y.o. female with medical history significant of history of bladder cancer status post urostomy, she came into the hospital complaining about nausea and vomiting   OT comments  Pt with increased participation this day  Follow Up Recommendations  Supervision/Assistance - 24 hour;Home health OT    Equipment Recommendations  None recommended by OT       Precautions / Restrictions Precautions Precautions: Fall       Mobility Bed Mobility Overal bed mobility: Needs Assistance Bed Mobility: Supine to Sit     Supine to sit: Min assist;Mod assist        Transfers Overall transfer level: Needs assistance Equipment used: 1 person hand held assist Transfers: Sit to/from [facility] Sit to Stand: Min assist Stand pivot transfers: Min assist                ADL Overall ADL's : Needs assistance/impaired     Grooming: Set up;Sitting                   Toilet Transfer: Minimal assistance Toilet Transfer Details (indicate cue type and reason): hand held A Toileting- Clothing Manipulation and Hygiene: Sit to/from stand;Cueing for safety;Moderate assistance         General ADL Comments: pt able to sit EOB this day.  pt also able to get to chair .                  Cognition   Behavior During Therapy: WFL for tasks assessed/performed Overall Cognitive Status: Within Functional Limits for tasks assessed                               General Comments  daughter present and encouraging    Pertinent Vitals/ Pain       Pain Assessment: No/denies pain         Frequency Min 2X/week     Progress Toward Goals  OT Goals(current goals can now be found in the care plan section)  Progress towards OT goals: Progressing toward goals     Plan Discharge plan needs to be updated        End of Session     Activity Tolerance Patient tolerated treatment well   Patient Left in chair;with call bell/phone within reach;with family/visitor present   Nurse Communication Mobility status        Time: [id number] OT Time Calculation (min): 25 min  Charges: OT General Charges $OT Visit: 1 Procedure OT Treatments $Self Care/Home Management : 23-37 mins  Julena Barbour, Thereasa Parkin 10/02/2015, 11:26 AM

## 2015-10-02 NOTE — Progress Notes (Signed)
PROGRESS NOTE  [name]  [id number] DOB: 05/07/1928 DOA: 09/29/2015 PCP: [name], MD Outpatient Specialists:  Subjective: Still with severe nausea, feels worse  Brief Narrative:  82 year old female with past medical history of bladder cancer status post urostomy came in with nausea, vomiting and fever, urinalysis consistent with UTI.  Assessment & Plan:   Sepsis/UTI-Partially treated -Sepsis pathophysiology resolved -complicated UTI in the presence of altered anatomy because of urostomy/h/o bladder Ca -Last admission she had multiple organisms grew from the urine including enterococcus, Enterobacter and Citrobacter. -urine Cx negative now, but received Abx the day prior to admission, hence can affect Cx data -Due to worsening nausea, stop Cipro and start IV rocephin  PSBO/Ileus -h/o multiple abd surgeries -Initial CT (5/15) was concerning for Ileus, subsequent Xray (5/16) showed normal bowel gas pattern, she continued to have severe nausea, vomiting subsided, then had a BM yesterday, so started clears but subsequently has had worsening nausea since 5/17, Kub today with worsening ileus, make NPO, if symptoms any worse of vomiting will place NGT today -IVf, KUb in am  Acute kidney injury This is presumed to be acute, creatinine of discharge last time was 1.3, presented with creatinine of 1.9 Presumed to be secondary to sepsis syndrome. -improved back to 1.3 now  Hypertension -now NPO, already got clonidine and amlodipine this am -add PRN Hydralazine since NPO now  Hypokalemia -repleted  DVT prophylaxis: Hep SQ Code Status: Full Code Family Communication: daughters at bedside Disposition Plan: home when improved   Consultants:   None  Procedures:   None  Antimicrobials:   Cipro and Amoxil  Objective: Filed Vitals:   10/01/15 1740 10/01/15 2100 10/02/15 0357 10/02/15 1000  BP:  159/77 142/68 147/59  Pulse: 78 89 71 72  Temp: 99.6 F (37.6 C)  99.6 F (37.6 C) 98 F (36.7 C)   TempSrc: Oral Oral Axillary   Resp:  16 16   Height:      Weight:      SpO2: 99% 98% 96%     Intake/Output Summary (Last 24 hours) at 10/02/15 1105 Last data filed at 10/02/15 0900  Gross per 24 hour  Intake   1860 ml  Output    600 ml  Net   1260 ml   Filed Weights   09/29/15 2015  Weight: 46.6 kg (102 lb 11.8 oz)    Examination: General exam: frail, elderly, no distress, AAOx2 Respiratory system: Clear to auscultation. Respiratory effort normal. Cardiovascular system: S1 & S2 heard, RRR. No JVD, murmurs, rubs, gallops or clicks. No pedal edema. Gastrointestinal system: Abdomen is nondistended, soft and mild L sided tender. No organomegaly or masses felt. Normal bowel sounds, urostomy noted Central nervous system: Alert and oriented. No focal neurological deficits. Extremities: Symmetric 5 x 5 power. Skin: No rashes, lesions or ulcers Psychiatry:  appropriate.   Data Reviewed: I have personally reviewed following labs and imaging studies  CBC:  Recent Labs Lab 09/29/15 1138 09/30/15 0432 10/01/15 0442 10/02/15 0448  WBC 7.0 7.9 7.3 7.7  HGB 12.6 10.9* 10.5* 11.3*  HCT 36.7 32.5* 31.8* 34.1*  MCV 92.7 96.2 98.5 97.7  PLT 281 218 174 [id number]   Basic Metabolic Panel:  Recent Labs Lab 09/29/15 1138 09/30/15 0432 10/01/15 0442 10/02/15 0448  NA 141 140 139 133*  K 4.5 3.4* 4.3 4.5  CL 101 105 110 107  CO2 26 25 22  20*  GLUCOSE 117* 103* 95 87  BUN 20 20 17 15   CREATININE 1.94*  1.71* 1.36* 1.24*  CALCIUM 11.0* 9.3 8.3* 7.8*  MG  --   --  1.3*  --    GFR: Estimated Creatinine Clearance: 24.4 mL/min (by C-G formula based on Cr of 1.24). Liver Function Tests:  Recent Labs Lab 09/29/15 1138 10/02/15 0448  AST 23 24  ALT 9* 6*  ALKPHOS 65 49  BILITOT 0.7 1.0  PROT 7.8 5.7*  ALBUMIN 4.8 3.3*    Recent Labs Lab 09/29/15 1138  LIPASE 41   No results for input(s): AMMONIA in the last 168 hours. Coagulation  Profile:  Recent Labs Lab 09/29/15 1944  INR 0.98   Cardiac Enzymes: No results for input(s): CKTOTAL, CKMB, CKMBINDEX, TROPONINI in the last 168 hours. BNP (last 3 results) No results for input(s): PROBNP in the last 8760 hours. HbA1C:  Recent Labs  09/29/15 1944  HGBA1C 5.5   CBG:  Recent Labs Lab 09/30/15 0718  GLUCAP 119*   Lipid Profile: No results for input(s): CHOL, HDL, LDLCALC, TRIG, CHOLHDL, LDLDIRECT in the last 72 hours. Thyroid Function Tests:  Recent Labs  09/29/15 1944  TSH 2.568   Anemia Panel: No results for input(s): VITAMINB12, FOLATE, FERRITIN, TIBC, IRON, RETICCTPCT in the last 72 hours. Urine analysis:    Component Value Date/Time   COLORURINE YELLOW 09/29/2015 1522   APPEARANCEUR CLOUDY* 09/29/2015 1522   LABSPEC 1.015 09/29/2015 1522   PHURINE 7.0 09/29/2015 1522   GLUCOSEU NEGATIVE 09/29/2015 1522   HGBUR TRACE* 09/29/2015 1522   BILIRUBINUR NEGATIVE 09/29/2015 1522   KETONESUR 15* 09/29/2015 1522   PROTEINUR 100* 09/29/2015 1522   UROBILINOGEN 0.2 05/10/2012 1123   NITRITE NEGATIVE 09/29/2015 1522   LEUKOCYTESUR MODERATE* 09/29/2015 1522   Sepsis Labs: @LABRCNTIP (procalcitonin:4,lacticidven:4)  ) Recent Results (from the past 240 hour(s))  Urine culture     Status: None   Collection Time: 09/29/15  3:23 PM  Result Value Ref Range Status   Specimen Description URINE, RANDOM  Final   Special Requests NONE  Final   Culture NO GROWTH Performed at [facility]   Final   Report Status 09/30/2015 FINAL  Final     Invalid input(s): PROCALCITONIN, LACTICACIDVEN   Radiology Studies: Dg Abd 1 View  10/02/2015  CLINICAL DATA:  Mid abdominal pain and nausea. Post cystectomy with ileal conduit. EXAM: ABDOMEN - 1 VIEW COMPARISON:  09/30/2015 FINDINGS: Air and contrast is seen within the colon. There are surgical clips over the right mid abdomen compatible with small bowel anastomosis. There is persistence of a few air-filled  borderline dilated small bowel loops over pelvis with slight interval increase in size and number. No free peritoneal air. Remainder of the exam is unchanged. IMPRESSION: Few persistent air-filled borderline dilated small bowel loops in the pelvis with slight interval worsening. Findings may be due to ileus versus early/ partial obstructive process. Electronically Signed   By: Marin Olp M.D.   On: 10/02/2015 09:40   Dg Abd 2 Views  09/30/2015  CLINICAL DATA:  Abdominal distension. Inpatient. History of cystectomy and ileal conduit urinary diversion for bladder cancer. EXAM: ABDOMEN - 2 VIEW COMPARISON:  09/29/2015 CT abdomen/ pelvis. FINDINGS: Stable top-normal caliber small bowel loops in the pelvis. No dilated small bowel loops. Surgical sutures are noted in the right lower quadrant. There is retained oral contrast in the right colon. No evidence of pneumatosis or pneumoperitoneum. Cholecystectomy clips are seen in the right upper quadrant of the abdomen. Mild-to-moderate degenerative changes in the visualized thoracolumbar spine. Clear lung bases. IMPRESSION:  Nonobstructive bowel gas pattern. Top-normal caliber pelvic small bowel loops are stable. Electronically Signed   By: Ilona Sorrel M.D.   On: 09/30/2015 12:05        Scheduled Meds: . amLODipine  5 mg Oral Daily  . cefTRIAXone (ROCEPHIN)  IV  1 g Intravenous Q24H  . cloNIDine  0.1 mg Oral BID  . donepezil  10 mg Oral QHS  . heparin  5,000 Units Subcutaneous Q8H  . pantoprazole  80 mg Oral Daily  . saccharomyces boulardii  250 mg Oral BID  . sodium chloride flush  3 mL Intravenous Q12H   Continuous Infusions: . sodium chloride 75 mL/hr at 10/01/15 2114     LOS: 3 days    Time spent: 35 minutes    Domenic Polite, MD [facility] Pager [phone]  If 7PM-7AM, please contact night-coverage [email or web address] Password [email or web address] 10/02/2015, 11:05 AM

## 2015-10-03 ENCOUNTER — Inpatient Hospital Stay (HOSPITAL_COMMUNITY): Payer: Medicare Other

## 2015-10-03 LAB — BASIC METABOLIC PANEL
ANION GAP: 9 (ref 5–15)
BUN: 18 mg/dL (ref 6–20)
CHLORIDE: 110 mmol/L (ref 101–111)
CO2: 19 mmol/L — AB (ref 22–32)
Calcium: 7.9 mg/dL — ABNORMAL LOW (ref 8.9–10.3)
Creatinine, Ser: 1.12 mg/dL — ABNORMAL HIGH (ref 0.44–1.00)
GFR calc non Af Amer: 44 mL/min — ABNORMAL LOW (ref >60)
GFR, EST AFRICAN AMERICAN: 50 mL/min — AB (ref >60)
GLUCOSE: 75 mg/dL (ref 65–99)
Potassium: 3.9 mmol/L (ref 3.5–5.1)
Sodium: 138 mmol/L (ref 135–145)

## 2015-10-03 LAB — CBC
HEMATOCRIT: 32.3 % — AB (ref 36.0–46.0)
HEMOGLOBIN: 10.7 g/dL — AB (ref 12.0–15.0)
MCH: 31.3 pg (ref 26.0–34.0)
MCHC: 33.1 g/dL (ref 30.0–36.0)
MCV: 94.4 fL (ref 78.0–100.0)
Platelets: 199 10*3/uL (ref 150–400)
RBC: 3.42 MIL/uL — ABNORMAL LOW (ref 3.87–5.11)
RDW: 12.7 % (ref 11.5–15.5)
WBC: 7 10*3/uL (ref 4.0–10.5)

## 2015-10-03 NOTE — Progress Notes (Signed)
Patient ambulated 160 feet in hallway per request with minimal assist and front wheel walker. Tolerated very well. Denied pain, SOB or N/V.

## 2015-10-03 NOTE — Progress Notes (Signed)
PROGRESS NOTE  [name]  [id number] DOB: Mar 18, 1928 DOA: 09/29/2015 PCP: [name], MD Outpatient Specialists:  Subjective: Still with severe nausea, feels worse  Brief Narrative:  82 year old female with past medical history of bladder cancer status post urostomy came in with nausea, vomiting and fever, urinalysis consistent with UTI. Also has partial SBO/ileus  Assessment & Plan:   Sepsis/UTI-Partially treated -Sepsis pathophysiology resolved -complicated UTI in the presence of altered anatomy because of urostomy/h/o bladder Ca -Last admission she had multiple organisms grew from the urine including enterococcus, Enterobacter and Citrobacter. -urine Cx negative now, but received Abx the day prior to admission, hence can affect Cx data -Due to worsening nausea, started IV rocephin, continue same for 1 more day  PSBO/Ileus -h/o multiple abd surgeries -Initial CT (5/15) was concerning for Ileus, subsequent Xray (5/16) showed normal bowel gas pattern, she continued to have severe nausea, vomiting subsided, then had a BM 5/17, so started clears but subsequently has had worsening nausea since 5/17, Kub 5/18 with worsening ileus, made NPO -feels better today, improved nausea and had a small BM, Kub without much changes -restart clears, ambulate  Acute kidney injury This is presumed to be acute, creatinine of discharge last time was 1.3, presented with creatinine of 1.9 Presumed to be secondary to sepsis syndrome. -improved  Hypertension -stable  Hypokalemia -repleted  DVT prophylaxis: Hep SQ Code Status: Full Code Family Communication: daughters at bedside Disposition Plan: home when improved , 1-49more days  Consultants:   None  Procedures:   None  Antimicrobials:   Cipro and Amoxil  Objective: Filed Vitals:   10/02/15 1000 10/02/15 1500 10/02/15 2114 10/03/15 0432  BP: 147/59 137/58 137/51 146/64  Pulse: 72 83 80 86  Temp:  99 F (37.2 C) 98.8  F (37.1 C) 99.4 F (37.4 C)  TempSrc:  Oral Oral Oral  Resp:  16 16 16   Height:      Weight:      SpO2:  97% 98% 98%    Intake/Output Summary (Last 24 hours) at 10/03/15 1206 Last data filed at 10/03/15 0600  Gross per 24 hour  Intake   1850 ml  Output   1200 ml  Net    650 ml   Filed Weights   09/29/15 2015  Weight: 46.6 kg (102 lb 11.8 oz)    Examination: General exam: frail, elderly, no distress, AAOx2 Respiratory system: Clear to auscultation. Respiratory effort normal. Cardiovascular system: S1 & S2 heard, RRR. No JVD, murmurs, rubs, gallops or clicks. No pedal edema. Gastrointestinal system: Abdomen is nondistended, soft and mild L sided tender. No organomegaly or masses felt. Normal bowel sounds, urostomy noted Central nervous system: Alert and oriented. No focal neurological deficits. Extremities: Symmetric 5 x 5 power. Skin: No rashes, lesions or ulcers Psychiatry:  appropriate.   Data Reviewed: I have personally reviewed following labs and imaging studies  CBC:  Recent Labs Lab 09/29/15 1138 09/30/15 0432 10/01/15 0442 10/02/15 0448 10/03/15 0445  WBC 7.0 7.9 7.3 7.7 7.0  HGB 12.6 10.9* 10.5* 11.3* 10.7*  HCT 36.7 32.5* 31.8* 34.1* 32.3*  MCV 92.7 96.2 98.5 97.7 94.4  PLT 281 218 174 191 [id number]   Basic Metabolic Panel:  Recent Labs Lab 09/29/15 1138 09/30/15 0432 10/01/15 0442 10/02/15 0448 10/03/15 0445  NA 141 140 139 133* 138  K 4.5 3.4* 4.3 4.5 3.9  CL 101 105 110 107 110  CO2 26 25 22  20* 19*  GLUCOSE 117* 103* 95 87 75  BUN 20 20 17 15 18   CREATININE 1.94* 1.71* 1.36* 1.24* 1.12*  CALCIUM 11.0* 9.3 8.3* 7.8* 7.9*  MG  --   --  1.3*  --   --    GFR: Estimated Creatinine Clearance: 27 mL/min (by C-G formula based on Cr of 1.12). Liver Function Tests:  Recent Labs Lab 09/29/15 1138 10/02/15 0448  AST 23 24  ALT 9* 6*  ALKPHOS 65 49  BILITOT 0.7 1.0  PROT 7.8 5.7*  ALBUMIN 4.8 3.3*    Recent Labs Lab 09/29/15 1138    LIPASE 41   No results for input(s): AMMONIA in the last 168 hours. Coagulation Profile:  Recent Labs Lab 09/29/15 1944  INR 0.98   Cardiac Enzymes: No results for input(s): CKTOTAL, CKMB, CKMBINDEX, TROPONINI in the last 168 hours. BNP (last 3 results) No results for input(s): PROBNP in the last 8760 hours. HbA1C: No results for input(s): HGBA1C in the last 72 hours. CBG:  Recent Labs Lab 09/30/15 0718  GLUCAP 119*   Lipid Profile: No results for input(s): CHOL, HDL, LDLCALC, TRIG, CHOLHDL, LDLDIRECT in the last 72 hours. Thyroid Function Tests: No results for input(s): TSH, T4TOTAL, FREET4, T3FREE, THYROIDAB in the last 72 hours. Anemia Panel: No results for input(s): VITAMINB12, FOLATE, FERRITIN, TIBC, IRON, RETICCTPCT in the last 72 hours. Urine analysis:    Component Value Date/Time   COLORURINE YELLOW 09/29/2015 1522   APPEARANCEUR CLOUDY* 09/29/2015 1522   LABSPEC 1.015 09/29/2015 1522   PHURINE 7.0 09/29/2015 1522   GLUCOSEU NEGATIVE 09/29/2015 1522   HGBUR TRACE* 09/29/2015 1522   BILIRUBINUR NEGATIVE 09/29/2015 1522   KETONESUR 15* 09/29/2015 1522   PROTEINUR 100* 09/29/2015 1522   UROBILINOGEN 0.2 05/10/2012 1123   NITRITE NEGATIVE 09/29/2015 1522   LEUKOCYTESUR MODERATE* 09/29/2015 1522   Sepsis Labs: @LABRCNTIP (procalcitonin:4,lacticidven:4)  ) Recent Results (from the past 240 hour(s))  Urine culture     Status: None   Collection Time: 09/29/15  3:23 PM  Result Value Ref Range Status   Specimen Description URINE, RANDOM  Final   Special Requests NONE  Final   Culture NO GROWTH Performed at [facility]   Final   Report Status 09/30/2015 FINAL  Final     Invalid input(s): PROCALCITONIN, LACTICACIDVEN   Radiology Studies: Dg Abd 1 View  10/03/2015  CLINICAL DATA:  Partial bowel obstruction EXAM: ABDOMEN - 1 VIEW COMPARISON:  10/02/2015 FINDINGS: Persistent mild gaseous distended small bowel loops within pelvis suspicious for ileus  or partial bowel obstruction. IMPRESSION: Persistent mild gaseous distended small bowel loops within pelvis suspicious for ileus or partial bowel obstruction. Electronically Signed   By: Lahoma Crocker M.D.   On: 10/03/2015 08:30   Dg Abd 1 View  10/02/2015  CLINICAL DATA:  Mid abdominal pain and nausea. Post cystectomy with ileal conduit. EXAM: ABDOMEN - 1 VIEW COMPARISON:  09/30/2015 FINDINGS: Air and contrast is seen within the colon. There are surgical clips over the right mid abdomen compatible with small bowel anastomosis. There is persistence of a few air-filled borderline dilated small bowel loops over pelvis with slight interval increase in size and number. No free peritoneal air. Remainder of the exam is unchanged. IMPRESSION: Few persistent air-filled borderline dilated small bowel loops in the pelvis with slight interval worsening. Findings may be due to ileus versus early/ partial obstructive process. Electronically Signed   By: Marin Olp M.D.   On: 10/02/2015 09:40        Scheduled Meds: . amLODipine  5 mg Oral Daily  . cefTRIAXone (ROCEPHIN)  IV  1 g Intravenous Q24H  . cloNIDine  0.1 mg Oral BID  . donepezil  10 mg Oral QHS  . heparin  5,000 Units Subcutaneous Q8H  . pantoprazole  80 mg Oral Daily  . saccharomyces boulardii  250 mg Oral BID  . sodium chloride flush  3 mL Intravenous Q12H   Continuous Infusions: . sodium chloride 75 mL/hr at 10/03/15 0430     LOS: 4 days    Time spent: 35 minutes    Domenic Polite, MD [facility] Pager [phone]  If 7PM-7AM, please contact night-coverage [email or web address] Password [email or web address] 10/03/2015, 12:06 PM

## 2015-10-03 NOTE — Progress Notes (Signed)
Physical Therapy Treatment Patient Details Name: [name] MRN: [id number] DOB: 04-23-1928 Today's Date: 10/03/2015    History of Present Illness 82 y.o. female with medical history significant of history of bladder cancer status post urostomy, she came into the hospital complaining about nausea and vomiting    PT Comments    Pt OOB in recliner with family in room.  Assisted with amb a greater distance in hallway.  Pt did have uncontrolled loose stools during amb.  Assisted with toilet ing and hygiene.    Follow Up Recommendations  Home health PT;Supervision/Assistance - 24 hour     Equipment Recommendations  Rolling walker with 5" wheels (youth)    Recommendations for Other Services       Precautions / Restrictions Precautions Precautions: Fall Restrictions Weight Bearing Restrictions: No    Mobility  Bed Mobility               General bed mobility comments: Pt OOB in recliner   Transfers Overall transfer level: Needs assistance Equipment used: None Transfers: Sit to/from Stand;Stand Pivot Transfers Sit to Stand: Min assist Stand pivot transfers: Min assist       General transfer comment: 25% VC's on safety with turns and proper hand placement  Ambulation/Gait Ambulation/Gait assistance: Min assist Ambulation Distance (Feet): 85 Feet Assistive device: Rolling walker (2 wheeled) Gait Pattern/deviations: Step-through pattern;Decreased stride length;Trunk flexed Gait velocity: decreased   General Gait Details: 25% VC's on proper walker use and negociating in room.  Mild unsteady gait with initial posterior LOB and poor self corrective reaction.      Stairs            Wheelchair Mobility    Modified Rankin (Stroke Patients Only)       Balance                                    Cognition Arousal/Alertness: Awake/alert Behavior During Therapy: WFL for tasks assessed/performed Overall Cognitive Status: Within Functional  Limits for tasks assessed                      Exercises      General Comments        Pertinent Vitals/Pain      Home Living                      Prior Function            PT Goals (current goals can now be found in the care plan section) Progress towards PT goals: Progressing toward goals    Frequency  Min 3X/week    PT Plan Current plan remains appropriate    Co-evaluation             End of Session           Time: 1139-1202 PT Time Calculation (min) (ACUTE ONLY): 23 min  Charges:  $Gait Training: 8-22 mins $Therapeutic Activity: 8-22 mins                    G Codes:      Rica Koyanagi  PTA [facility]  Acute  Rehab Pager      [phone]

## 2015-10-04 DIAGNOSIS — R140 Abdominal distension (gaseous): Secondary | ICD-10-CM | POA: Insufficient documentation

## 2015-10-04 DIAGNOSIS — R1110 Vomiting, unspecified: Secondary | ICD-10-CM | POA: Insufficient documentation

## 2015-10-04 LAB — CBC
HCT: 28.7 % — ABNORMAL LOW (ref 36.0–46.0)
Hemoglobin: 9.8 g/dL — ABNORMAL LOW (ref 12.0–15.0)
MCH: 32.5 pg (ref 26.0–34.0)
MCHC: 34.1 g/dL (ref 30.0–36.0)
MCV: 95 fL (ref 78.0–100.0)
Platelets: 152 K/uL (ref 150–400)
RBC: 3.02 MIL/uL — ABNORMAL LOW (ref 3.87–5.11)
RDW: 13.1 % (ref 11.5–15.5)
WBC: 7 K/uL (ref 4.0–10.5)

## 2015-10-04 LAB — BASIC METABOLIC PANEL WITH GFR
Anion gap: 5 (ref 5–15)
BUN: 16 mg/dL (ref 6–20)
CO2: 17 mmol/L — ABNORMAL LOW (ref 22–32)
Calcium: 7.3 mg/dL — ABNORMAL LOW (ref 8.9–10.3)
Chloride: 113 mmol/L — ABNORMAL HIGH (ref 101–111)
Creatinine, Ser: 1.09 mg/dL — ABNORMAL HIGH (ref 0.44–1.00)
GFR calc Af Amer: 52 mL/min — ABNORMAL LOW
GFR calc non Af Amer: 45 mL/min — ABNORMAL LOW
Glucose, Bld: 87 mg/dL (ref 65–99)
Potassium: 3.6 mmol/L (ref 3.5–5.1)
Sodium: 135 mmol/L (ref 135–145)

## 2015-10-04 NOTE — Progress Notes (Signed)
PROGRESS NOTE  Rachel MESSIMER  [id number] DOB: [birth year] DOA: 09/29/2015 PCP: [name], MD Outpatient Specialists:  Subjective: Still with severe nausea, feels worse  Brief Narrative:  82 year old female with past medical history of bladder cancer status post urostomy came in with nausea, vomiting and fever, urinalysis consistent with UTI. Also has partial SBO/ileus  Assessment & Plan:   Sepsis/UTI-Partially treated -Sepsis pathophysiology resolved -complicated UTI in the presence of altered anatomy because of urostomy/h/o bladder Ca -Last admission she had multiple organisms grew from the urine including enterococcus, Enterobacter and Citrobacter. -urine Cx negative now, but received Abx the day prior to admission, hence can affect Cx data -Due to worsening nausea, started IV rocephin, stop today as completed 5days of Abx  PSBO/Ileus -h/o multiple abd surgeries -Initial CT (5/15) was concerning for Ileus, subsequent Xray (5/16) showed normal bowel gas pattern, she continued to have severe nausea, vomiting subsided, then had a BM 5/17, so started clears but subsequently has had worsening nausea since 5/17, Kub 5/18 with worsening ileus, made NPO -feels better since 5/19, improved nausea and had a small BM, Kub without much changes, tolerating full liquids and advance to soft diet today -home if tolerates soft diet  Acute kidney injury This is presumed to be acute, creatinine of discharge last time was 1.3, presented with creatinine of 1.9 Presumed to be secondary to sepsis syndrome. -improved  Hypertension -stable  Hypokalemia -repleted  DVT prophylaxis: Hep SQ Code Status: Full Code Family Communication: daughters at bedside Disposition Plan: home later today if tolerates diet  Consultants:   None  Procedures:   None  Antimicrobials:   Cipro and Amoxil  Objective: Filed Vitals:   10/03/15 1335 10/03/15 1800 10/03/15 2105 10/04/15 0421  BP: 103/39  125/45 129/41 123/58  Pulse: 60 64 65 72  Temp: 97.6 F (36.4 C)  98.1 F (36.7 C) 98.9 F (37.2 C)  TempSrc: Oral  Oral Oral  Resp: 16  18 18   Height:      Weight:      SpO2: 99%  100% 98%    Intake/Output Summary (Last 24 hours) at 10/04/15 1238 Last data filed at 10/04/15 0800  Gross per 24 hour  Intake 1587.92 ml  Output   1050 ml  Net 537.92 ml   Filed Weights   09/29/15 2015  Weight: 46.6 kg (102 lb 11.8 oz)    Examination: General exam: frail, elderly, no distress, AAOx2 Respiratory system: Clear to auscultation. Respiratory effort normal. Cardiovascular system: S1 & S2 heard, RRR. No JVD, murmurs, rubs, gallops or clicks. No pedal edema. Gastrointestinal system: Abdomen is nondistended, soft and mild L sided tender. No organomegaly or masses felt. Normal bowel sounds, urostomy noted Central nervous system: Alert and oriented. No focal neurological deficits. Extremities: Symmetric 5 x 5 power. Skin: No rashes, lesions or ulcers Psychiatry:  appropriate.   Data Reviewed: I have personally reviewed following labs and imaging studies  CBC:  Recent Labs Lab 09/30/15 0432 10/01/15 0442 10/02/15 0448 10/03/15 0445 10/04/15 0441  WBC 7.9 7.3 7.7 7.0 7.0  HGB 10.9* 10.5* 11.3* 10.7* 9.8*  HCT 32.5* 31.8* 34.1* 32.3* 28.7*  MCV 96.2 98.5 97.7 94.4 95.0  PLT 218 174 191 199 0000000   Basic Metabolic Panel:  Recent Labs Lab 09/30/15 0432 10/01/15 0442 10/02/15 0448 10/03/15 0445 10/04/15 0441  NA 140 139 133* 138 135  K 3.4* 4.3 4.5 3.9 3.6  CL 105 110 107 110 113*  CO2 25 22 20* 19* 17*  GLUCOSE 103* 95 87 75 87  BUN 20 17 15 18 16   CREATININE 1.71* 1.36* 1.24* 1.12* 1.09*  CALCIUM 9.3 8.3* 7.8* 7.9* 7.3*  MG  --  1.3*  --   --   --    GFR: Estimated Creatinine Clearance: 27.8 mL/min (by C-G formula based on Cr of 1.09). Liver Function Tests:  Recent Labs Lab 09/29/15 1138 10/02/15 0448  AST 23 24  ALT 9* 6*  ALKPHOS 65 49  BILITOT 0.7 1.0   PROT 7.8 5.7*  ALBUMIN 4.8 3.3*    Recent Labs Lab 09/29/15 1138  LIPASE 41   No results for input(s): AMMONIA in the last 168 hours. Coagulation Profile:  Recent Labs Lab 09/29/15 1944  INR 0.98   Cardiac Enzymes: No results for input(s): CKTOTAL, CKMB, CKMBINDEX, TROPONINI in the last 168 hours. BNP (last 3 results) No results for input(s): PROBNP in the last 8760 hours. HbA1C: No results for input(s): HGBA1C in the last 72 hours. CBG:  Recent Labs Lab 09/30/15 0718  GLUCAP 119*   Lipid Profile: No results for input(s): CHOL, HDL, LDLCALC, TRIG, CHOLHDL, LDLDIRECT in the last 72 hours. Thyroid Function Tests: No results for input(s): TSH, T4TOTAL, FREET4, T3FREE, THYROIDAB in the last 72 hours. Anemia Panel: No results for input(s): VITAMINB12, FOLATE, FERRITIN, TIBC, IRON, RETICCTPCT in the last 72 hours. Urine analysis:    Component Value Date/Time   COLORURINE YELLOW 09/29/2015 1522   APPEARANCEUR CLOUDY* 09/29/2015 1522   LABSPEC 1.015 09/29/2015 1522   PHURINE 7.0 09/29/2015 1522   GLUCOSEU NEGATIVE 09/29/2015 1522   HGBUR TRACE* 09/29/2015 1522   BILIRUBINUR NEGATIVE 09/29/2015 1522   KETONESUR 15* 09/29/2015 1522   PROTEINUR 100* 09/29/2015 1522   UROBILINOGEN 0.2 05/10/2012 1123   NITRITE NEGATIVE 09/29/2015 1522   LEUKOCYTESUR MODERATE* 09/29/2015 1522   Sepsis Labs: @LABRCNTIP (procalcitonin:4,lacticidven:4)  ) Recent Results (from the past 240 hour(s))  Urine culture     Status: None   Collection Time: 09/29/15  3:23 PM  Result Value Ref Range Status   Specimen Description URINE, RANDOM  Final   Special Requests NONE  Final   Culture NO GROWTH Performed at [facility]   Final   Report Status 09/30/2015 FINAL  Final     Invalid input(s): PROCALCITONIN, LACTICACIDVEN   Radiology Studies: Dg Abd 1 View  10/03/2015  CLINICAL DATA:  Partial bowel obstruction EXAM: ABDOMEN - 1 VIEW COMPARISON:  10/02/2015 FINDINGS: Persistent  mild gaseous distended small bowel loops within pelvis suspicious for ileus or partial bowel obstruction. IMPRESSION: Persistent mild gaseous distended small bowel loops within pelvis suspicious for ileus or partial bowel obstruction. Electronically Signed   By: Lahoma Crocker M.D.   On: 10/03/2015 08:30        Scheduled Meds: . amLODipine  5 mg Oral Daily  . cefTRIAXone (ROCEPHIN)  IV  1 g Intravenous Q24H  . cloNIDine  0.1 mg Oral BID  . donepezil  10 mg Oral QHS  . heparin  5,000 Units Subcutaneous Q8H  . pantoprazole  80 mg Oral Daily  . saccharomyces boulardii  250 mg Oral BID  . sodium chloride flush  3 mL Intravenous Q12H   Continuous Infusions:     LOS: 5 days    Time spent: 35 minutes    Domenic Polite, MD [facility] Pager [phone]  If 7PM-7AM, please contact night-coverage [email or web address] Password [facility] 10/04/2015, 12:38 PM

## 2015-10-04 NOTE — Progress Notes (Signed)
CM met with pt and pt's family to discuss disposition.  CM explained pt's insurance will not cover SNF as pt is ambulating with rolling walker 160+ feet in hallway; family verbalizes understanding SNF would be an out of pocket expense and have arranged 24-hr supervision amongst family members.  CM gave family Private duty agency list and family verbalized understanding this is also an out of pocket expense.  Family chooses [facility] to render HHPT/OT/SW.  Referral called to [facility] rep, Tiffany.  Family explained they hoped pt could stay another day and CM explained to family once pt has reached a level of stability to go home, pt will be discharged to recuperate outside the hospital setting.  CM called [facility] DME rep, Merry Proud to please deliver the 3n1 and rolling walker to room prior to discharge today.  No other CM needs were communicated.

## 2015-10-05 DIAGNOSIS — K567 Ileus, unspecified: Secondary | ICD-10-CM | POA: Insufficient documentation

## 2015-10-05 LAB — CBC
HEMATOCRIT: 28.6 % — AB (ref 36.0–46.0)
HEMOGLOBIN: 9.6 g/dL — AB (ref 12.0–15.0)
MCH: 31.2 pg (ref 26.0–34.0)
MCHC: 33.6 g/dL (ref 30.0–36.0)
MCV: 92.9 fL (ref 78.0–100.0)
Platelets: 161 10*3/uL (ref 150–400)
RBC: 3.08 MIL/uL — AB (ref 3.87–5.11)
RDW: 13 % (ref 11.5–15.5)
WBC: 6.4 10*3/uL (ref 4.0–10.5)

## 2015-10-05 NOTE — Progress Notes (Signed)
Reviewed discharge information with patient and caregiver. Answered all questions. Patient/caregiver able to teach back medications and reasons to contact MD/911. Patient verbalizes importance of PCP follow up appointment.  Keegen Heffern M. Chamya Hunton, RN  

## 2015-10-06 NOTE — Discharge Summary (Signed)
Physician Discharge Summary  [name] [id number] DOB: 02/20/1928 DOA: 09/29/2015  PCP: [name], MD  Admit date: 09/29/2015 Discharge date: 10/06/2015  Time spent: 45 minutes  Recommendations for Outpatient Follow-up:  1. Dr. Coletta Memos in 1 week   Discharge Diagnoses:    Partially treated UTI   Ileus vs Partial SBO   Fever   Acute renal failure (HCC)   Hypertension   UTI (lower urinary tract infection)   Vomiting   Abdominal distension   Uncontrollable vomiting   Ileus ([place])   Discharge Condition: regular  Diet recommendation: soft diet advance as tolerated  Filed Weights   09/29/15 2015  Weight: 46.6 kg (102 lb 11.8 oz)    History of present illness:  82 year old female with past medical history of bladder cancer status post urostomy came in with nausea, vomiting and fever, urinalysis consistent with UTI. Also had partial SBO/ileus noted on CT abdomen  Hospital Course:  Sepsis/UTI-Partially treated -Sepsis pathophysiology resolved -h/o of urostomy/h/o bladder Ca -Last admission she had multiple organisms grew from the urine including enterococcus, Enterobacter and Citrobacter. -urine Cx negative now, but received Abx the day prior to admission, hence can affect Cx data -Due to worsening nausea, started IV rocephin, then stopped after she completed 5days of Abx  PSBO/Ileus -h/o multiple abd surgeries -Initial CT (5/15) was concerning for Ileus, subsequent Xray (5/16) showed normal bowel gas pattern, she continued to have severe nausea, vomiting subsided, then had a BM 5/17, so started clears but subsequently has had worsening nausea since 5/17, Kub 5/18 with worsening ileus, made NPO again -then started feeling much better since 5/19, improved nausea and started having bowel movements, Kub without much changes, -diet slowly advanced and tolerating a regular diet at the time of discharge  Acute kidney injury This is presumed to be acute, creatinine of  discharge last time was 1.3, presented with creatinine of 1.9 Presumed to be secondary to sepsis syndrome. -improved  Hypertension -stable  Hypokalemia -repleted  Discharge Exam: Filed Vitals:   10/04/15 2132 10/05/15 0627  BP: 132/50 133/52  Pulse: 63 67  Temp: 98.9 F (37.2 C) 98.9 F (37.2 C)  Resp: 16 16    General: Frail, elderly, no distress Cardiovascular: S1S2/RRR Respiratory: CTAB  Discharge Instructions   Discharge Instructions    Discharge instructions    Complete by:  As directed   Soft, bland diet, advance as tolerated     Increase activity slowly    Complete by:  As directed           Discharge Medication List as of 10/05/2015 10:53 AM    CONTINUE these medications which have NOT CHANGED   Details  acetaminophen (TYLENOL) 325 MG tablet Take 650 mg by mouth every 6 (six) hours as needed for moderate pain. , Until Discontinued, Historical Med    amLODipine (NORVASC) 5 MG tablet Take 5 mg by mouth daily. , Until Discontinued, Historical Med    budesonide-formoterol (SYMBICORT) 80-4.5 MCG/ACT inhaler Inhale 2 puffs into the lungs 2 (two) times daily as needed (shortness of breath). , Until Discontinued, Historical Med    cetirizine (ZYRTEC) 10 MG tablet Take 10 mg by mouth daily as needed for allergies., Until Discontinued, Historical Med    clonazepam (KLONOPIN) 0.125 MG disintegrating tablet Take 0.125 mg by mouth daily as needed (anxiety). , Until Discontinued, Historical Med    cloNIDine (CATAPRES) 0.1 MG tablet Take 0.1 mg by mouth 2 (two) times daily. , Until Discontinued, Historical Med  Cranberry (AZO-CRANBERRY) 450 MG TABS Take 1 tablet by mouth 2 (two) times daily. , Until Discontinued, Historical Med    donepezil (ARICEPT) 10 MG tablet Take 10 mg by mouth at bedtime., Until Discontinued, Historical Med    fluticasone (FLONASE) 50 MCG/ACT nasal spray Place 2 sprays into both nostrils daily as needed for allergies or rhinitis., Until  Discontinued, Historical Med    meclizine (ANTIVERT) 12.5 MG tablet Take 12.5 mg by mouth 3 (three) times daily as needed for dizziness. , Starting 07/07/2015, Until Discontinued, Historical Med    omeprazole (PRILOSEC) 40 MG capsule Take 40 mg by mouth every evening., Until Discontinued, Historical Med    ondansetron (ZOFRAN-ODT) 4 MG disintegrating tablet Take 4 mg by mouth 2 (two) times daily as needed for nausea or vomiting., Until Discontinued, Historical Med    saccharomyces boulardii (FLORASTOR) 250 MG capsule Take 1 capsule (250 mg total) by mouth 2 (two) times daily., Starting 08/19/2015, Until Discontinued, Print      STOP taking these medications     nitrofurantoin, macrocrystal-monohydrate, (MACROBID) 100 MG capsule      ciprofloxacin (CIPRO) 500 MG tablet        Allergies  Allergen Reactions  . Codeine Nausea And Vomiting  . Metoclopramide Other (See Comments)    Makes patient "climb the walls"    Follow-up Information    Follow up with [facility].   Why:  home health physical and occupational therapy, and social worker for community resources   Contact information:   [address] [place] [address] [phone]       Follow up with [facility].   Why:  rolling walker   Contact information:   [address] [place] [address] [phone]       Follow up with [name], MD. Schedule an appointment as soon as possible for a visit in 1 week.   Specialty:  Family Medicine   Contact information:   [place] [address] [phone]        The results of significant diagnostics from this hospitalization (including imaging, microbiology, ancillary and laboratory) are listed below for reference.    Significant Diagnostic Studies: Ct Abdomen Pelvis Wo Contrast  09/29/2015  CLINICAL DATA:  Nausea and vomiting for the last 2 days. EXAM: CT ABDOMEN AND PELVIS WITHOUT  CONTRAST TECHNIQUE: Multidetector CT imaging of the abdomen and pelvis was performed following the standard protocol without IV contrast. COMPARISON:  09/23/2015 FINDINGS: Lower chest: The lungs are clear. The heart is mildly enlarged. There is small pericardial effusion. There is a small hiatal hernia. Hepatobiliary: Persistent moderate intrahepatic and extrahepatic biliary dilation, status post cholecystectomy. Scattered hepatic calcified granulomas. Pancreas: No mass or inflammatory process identified on this un-enhanced exam. Spleen: Within normal limits in size. Adrenals/Urinary Tract: No evidence of urolithiasis or hydronephrosis. No definite mass visualized on this un-enhanced exam. Stable left renal cyst. Stomach/Bowel: Patient is status post cystectomy with ileal conduit. There is mild sub pathologic distention of small bowel loops within the pelvis leading to the conduit, which are fluid-filled. Vascular/Lymphatic: No pathologically enlarged lymph nodes. No evidence of abdominal aortic aneurysm. Reproductive: No mass or other significant abnormality. Other: None. Musculoskeletal:  No suspicious bone lesions identified. IMPRESSION: Status post cystectomy with still appearance of ileal conduit. Sub pathologic distention of fluid-filled small bowel loops within the pelvis, may be inflammatory, or it may represent ileus versus early/ intermittent small bowel  obstruction. Small pericardial effusion. Small hiatal hernia. Persistent biliary dilation, status postcholecystectomy. Electronically Signed   By: Fidela Salisbury M.D.   On: 09/29/2015 15:56   Dg Chest 2 View  09/29/2015  CLINICAL DATA:  Nausea and vomiting.  Shortness of breath. EXAM: CHEST  2 VIEW COMPARISON:  3/29/ FINDINGS: The heart size and mediastinal contours are within normal limits. There is no evidence of pulmonary edema, consolidation, pneumothorax, nodule or pleural fluid. The visualized skeletal structures are unremarkable. IMPRESSION:  No active cardiopulmonary disease. Electronically Signed   By: Aletta Edouard M.D.   On: 09/29/2015 13:32   Dg Abd 1 View  10/03/2015  CLINICAL DATA:  Partial bowel obstruction EXAM: ABDOMEN - 1 VIEW COMPARISON:  10/02/2015 FINDINGS: Persistent mild gaseous distended small bowel loops within pelvis suspicious for ileus or partial bowel obstruction. IMPRESSION: Persistent mild gaseous distended small bowel loops within pelvis suspicious for ileus or partial bowel obstruction. Electronically Signed   By: Lahoma Crocker M.D.   On: 10/03/2015 08:30   Dg Abd 1 View  10/02/2015  CLINICAL DATA:  Mid abdominal pain and nausea. Post cystectomy with ileal conduit. EXAM: ABDOMEN - 1 VIEW COMPARISON:  09/30/2015 FINDINGS: Air and contrast is seen within the colon. There are surgical clips over the right mid abdomen compatible with small bowel anastomosis. There is persistence of a few air-filled borderline dilated small bowel loops over pelvis with slight interval increase in size and number. No free peritoneal air. Remainder of the exam is unchanged. IMPRESSION: Few persistent air-filled borderline dilated small bowel loops in the pelvis with slight interval worsening. Findings may be due to ileus versus early/ partial obstructive process. Electronically Signed   By: Marin Olp M.D.   On: 10/02/2015 09:40   Dg Abd 2 Views  09/30/2015  CLINICAL DATA:  Abdominal distension. Inpatient. History of cystectomy and ileal conduit urinary diversion for bladder cancer. EXAM: ABDOMEN - 2 VIEW COMPARISON:  09/29/2015 CT abdomen/ pelvis. FINDINGS: Stable top-normal caliber small bowel loops in the pelvis. No dilated small bowel loops. Surgical sutures are noted in the right lower quadrant. There is retained oral contrast in the right colon. No evidence of pneumatosis or pneumoperitoneum. Cholecystectomy clips are seen in the right upper quadrant of the abdomen. Mild-to-moderate degenerative changes in the visualized thoracolumbar  spine. Clear lung bases. IMPRESSION: Nonobstructive bowel gas pattern. Top-normal caliber pelvic small bowel loops are stable. Electronically Signed   By: Ilona Sorrel M.D.   On: 09/30/2015 12:05    Microbiology: Recent Results (from the past 240 hour(s))  Urine culture     Status: None   Collection Time: 09/29/15  3:23 PM  Result Value Ref Range Status   Specimen Description URINE, RANDOM  Final   Special Requests NONE  Final   Culture NO GROWTH Performed at [facility]   Final   Report Status 09/30/2015 FINAL  Final     Labs: Basic Metabolic Panel:  Recent Labs Lab 09/30/15 0432 10/01/15 0442 10/02/15 0448 10/03/15 0445 10/04/15 0441  NA 140 139 133* 138 135  K 3.4* 4.3 4.5 3.9 3.6  CL 105 110 107 110 113*  CO2 25 22 20* 19* 17*  GLUCOSE 103* 95 87 75 87  BUN 20 17 15 18 16   CREATININE 1.71* 1.36* 1.24* 1.12* 1.09*  CALCIUM 9.3 8.3* 7.8* 7.9* 7.3*  MG  --  1.3*  --   --   --    Liver Function Tests:  Recent Labs Lab 10/02/15  0448  AST 24  ALT 6*  ALKPHOS 49  BILITOT 1.0  PROT 5.7*  ALBUMIN 3.3*   No results for input(s): LIPASE, AMYLASE in the last 168 hours. No results for input(s): AMMONIA in the last 168 hours. CBC:  Recent Labs Lab 10/01/15 0442 10/02/15 0448 10/03/15 0445 10/04/15 0441 10/05/15 0541  WBC 7.3 7.7 7.0 7.0 6.4  HGB 10.5* 11.3* 10.7* 9.8* 9.6*  HCT 31.8* 34.1* 32.3* 28.7* 28.6*  MCV 98.5 97.7 94.4 95.0 92.9  PLT 174 191 199 152 161   Cardiac Enzymes: No results for input(s): CKTOTAL, CKMB, CKMBINDEX, TROPONINI in the last 168 hours. BNP: BNP (last 3 results) No results for input(s): BNP in the last 8760 hours.  ProBNP (last 3 results) No results for input(s): PROBNP in the last 8760 hours.  CBG:  Recent Labs Lab 09/30/15 0718  GLUCAP 119*       SignedDomenic Polite MD.  [facility] 10/06/2015, 4:05 PM

## 2015-11-14 ENCOUNTER — Emergency Department (HOSPITAL_COMMUNITY): Payer: Medicare Other

## 2015-11-14 ENCOUNTER — Emergency Department (HOSPITAL_COMMUNITY)
Admission: EM | Admit: 2015-11-14 | Discharge: 2015-11-15 | Disposition: A | Discharge status: Home / Self Care | Payer: Medicare Other | Attending: Emergency Medicine | Admitting: Emergency Medicine

## 2015-11-14 ENCOUNTER — Encounter (HOSPITAL_COMMUNITY): Payer: Self-pay

## 2015-11-14 DIAGNOSIS — R600 Localized edema: Secondary | ICD-10-CM

## 2015-11-14 DIAGNOSIS — R2242 Localized swelling, mass and lump, left lower limb: Secondary | ICD-10-CM | POA: Insufficient documentation

## 2015-11-14 DIAGNOSIS — M810 Age-related osteoporosis without current pathological fracture: Secondary | ICD-10-CM | POA: Insufficient documentation

## 2015-11-14 DIAGNOSIS — Z79899 Other long term (current) drug therapy: Secondary | ICD-10-CM | POA: Insufficient documentation

## 2015-11-14 DIAGNOSIS — I10 Essential (primary) hypertension: Secondary | ICD-10-CM | POA: Insufficient documentation

## 2015-11-14 DIAGNOSIS — M79605 Pain in left leg: Secondary | ICD-10-CM | POA: Diagnosis present

## 2015-11-14 DIAGNOSIS — I509 Heart failure, unspecified: Secondary | ICD-10-CM | POA: Diagnosis not present

## 2015-11-14 DIAGNOSIS — L03116 Cellulitis of left lower limb: Secondary | ICD-10-CM

## 2015-11-14 DIAGNOSIS — Z8551 Personal history of malignant neoplasm of bladder: Secondary | ICD-10-CM | POA: Insufficient documentation

## 2015-11-14 DIAGNOSIS — R52 Pain, unspecified: Secondary | ICD-10-CM

## 2015-11-14 LAB — COMPREHENSIVE METABOLIC PANEL
ALBUMIN: 3.4 g/dL — AB (ref 3.5–5.0)
ALK PHOS: 51 U/L (ref 38–126)
ALT: 17 U/L (ref 14–54)
ANION GAP: 4 — AB (ref 5–15)
AST: 21 U/L (ref 15–41)
BUN: 31 mg/dL — ABNORMAL HIGH (ref 6–20)
CALCIUM: 9.3 mg/dL (ref 8.9–10.3)
CHLORIDE: 112 mmol/L — AB (ref 101–111)
CO2: 25 mmol/L (ref 22–32)
CREATININE: 1.83 mg/dL — AB (ref 0.44–1.00)
GFR calc Af Amer: 28 mL/min — ABNORMAL LOW (ref >60)
GFR calc non Af Amer: 24 mL/min — ABNORMAL LOW (ref >60)
Glucose, Bld: 128 mg/dL — ABNORMAL HIGH (ref 65–99)
Potassium: 4.3 mmol/L (ref 3.5–5.1)
SODIUM: 141 mmol/L (ref 135–145)
Total Bilirubin: 0.5 mg/dL (ref 0.3–1.2)
Total Protein: 6.4 g/dL — ABNORMAL LOW (ref 6.5–8.1)

## 2015-11-14 LAB — CBC WITH DIFFERENTIAL/PLATELET
BASOS ABS: 0 10*3/uL (ref 0.0–0.1)
BASOS PCT: 0 %
EOS ABS: 0 10*3/uL (ref 0.0–0.7)
Eosinophils Relative: 0 %
HCT: 35.6 % — ABNORMAL LOW (ref 36.0–46.0)
HEMOGLOBIN: 11.9 g/dL — AB (ref 12.0–15.0)
Lymphocytes Relative: 12 %
Lymphs Abs: 0.9 10*3/uL (ref 0.7–4.0)
MCH: 31.7 pg (ref 26.0–34.0)
MCHC: 33.4 g/dL (ref 30.0–36.0)
MCV: 94.9 fL (ref 78.0–100.0)
MONOS PCT: 5 %
Monocytes Absolute: 0.4 10*3/uL (ref 0.1–1.0)
NEUTROS ABS: 6.6 10*3/uL (ref 1.7–7.7)
NEUTROS PCT: 83 %
Platelets: 245 10*3/uL (ref 150–400)
RBC: 3.75 MIL/uL — ABNORMAL LOW (ref 3.87–5.11)
RDW: 13.6 % (ref 11.5–15.5)
WBC: 7.9 10*3/uL (ref 4.0–10.5)

## 2015-11-14 LAB — SEDIMENTATION RATE: SED RATE: 8 mm/h (ref 0–22)

## 2015-11-14 LAB — BRAIN NATRIURETIC PEPTIDE: B NATRIURETIC PEPTIDE 5: 194.6 pg/mL — AB (ref 0.0–100.0)

## 2015-11-14 LAB — C-REACTIVE PROTEIN

## 2015-11-14 MED ORDER — HYDROCODONE-ACETAMINOPHEN 5-325 MG PO TABS
1.0000 | ORAL_TABLET | Freq: Once | ORAL | Status: AC
  Administered 2015-11-14: 1 via ORAL
  Filled 2015-11-14: qty 1

## 2015-11-14 NOTE — ED Provider Notes (Signed)
CSN: [id number]     Arrival date & time 11/14/15  1407 History   First MD Initiated Contact with Patient 11/14/15 1548     Chief Complaint  Patient presents with  . Leg Pain     (Consider location/radiation/quality/duration/timing/severity/associated sxs/prior Treatment) HPI 82 year old female who presents with left lower leg pain. History of bladder cancer status post urostomy, mild dementia, and hypertension. States that 2-3 weeks of progressive left lower extremity swelling and pain, not improved by rest and leg elevation. No fever, chills, night sweats, numbness weakness, but having some difficulty walking. Minimal overlying redness at the ankles. NO cough, sob, chest pain, syncope, near syncope, urine output changes, orthopnea, PND, abdomianl distension or pain.   Seen by PCP yesterday, and had [place] of LE that was negative for DVT. She was started on keflex with ? Of cellulitis. Seen at her primary care doctor's office today, and Dr. Ronnald Ramp saw patient and family, recommending going to ED for r/o osteomyelitis with MRI.   Past Medical History  Diagnosis Date  . Bladder cancer ([place])   . Kidney stones   . Hypertension   . Dementia   . Osteoporosis   . UTI (lower urinary tract infection)    Past Surgical History  Procedure Laterality Date  . Bladder removed due to cancer     . Revision urostomy cutaneous    . Total shoulder arthroplasty    . Cholecystectomy    . Abdominal hysterectomy    . Appendectomy     Family History  Problem Relation Age of Onset  . Other Mother   . Heart attack Father    Social History  Substance Use Topics  . Smoking status: Never Smoker   . Smokeless tobacco: Never Used  . Alcohol Use: No   OB History    No data available     Review of Systems 10/14 systems reviewed and are negative other than those stated in the HPI    Allergies  Codeine and Metoclopramide  Home Medications   Prior to Admission medications   Medication Sig Start Date  End Date Taking? Authorizing Provider  acetaminophen (TYLENOL) 325 MG tablet Take 650 mg by mouth every 6 (six) hours as needed for moderate pain.    Yes Historical Provider, MD  budesonide-formoterol (SYMBICORT) 80-4.5 MCG/ACT inhaler Inhale 2 puffs into the lungs 2 (two) times daily as needed (shortness of breath).    Yes Historical Provider, MD  cephALEXin (KEFLEX) 500 MG capsule Take 500 mg by mouth 3 (three) times daily. Started 06/29 for 10 days   Yes Historical Provider, MD  cetirizine (ZYRTEC) 10 MG tablet Take 10 mg by mouth at bedtime.    Yes Historical Provider, MD  clonazepam (KLONOPIN) 0.125 MG disintegrating tablet Take 0.125 mg by mouth daily as needed (anxiety).    Yes Historical Provider, MD  cloNIDine (CATAPRES) 0.1 MG tablet Take 0.1 mg by mouth at bedtime.    Yes Historical Provider, MD  Cranberry (AZO-CRANBERRY) 450 MG TABS Take 1 tablet by mouth 2 (two) times daily.    Yes Historical Provider, MD  donepezil (ARICEPT) 10 MG tablet Take 10 mg by mouth at bedtime.   Yes Historical Provider, MD  fluticasone (FLONASE) 50 MCG/ACT nasal spray Place 2 sprays into both nostrils daily as needed for allergies or rhinitis.   Yes Historical Provider, MD  meclizine (ANTIVERT) 12.5 MG tablet Take 12.5 mg by mouth 3 (three) times daily as needed for dizziness.  07/07/15  Yes  Historical Provider, MD  mirtazapine (REMERON) 7.5 MG tablet Take 15 mg by mouth at bedtime.   Yes Historical Provider, MD  nitrofurantoin (MACRODANTIN) 50 MG capsule Take 50 mg by mouth at bedtime.   Yes Historical Provider, MD  omeprazole (PRILOSEC) 40 MG capsule Take 40 mg by mouth every evening.   Yes Historical Provider, MD  ondansetron (ZOFRAN-ODT) 4 MG disintegrating tablet Take 4 mg by mouth 2 (two) times daily as needed for nausea or vomiting.   Yes Historical Provider, MD  saccharomyces boulardii (FLORASTOR) 250 MG capsule Take 1 capsule (250 mg total) by mouth 2 (two) times daily. 08/19/15  Yes Charlynne Cousins,  MD  HYDROcodone-acetaminophen (NORCO/VICODIN) 5-325 MG tablet Take 0.5-1 tablets by mouth every 8 (eight) hours as needed. 11/15/15   Quintella Reichert, MD   BP 179/80 mmHg  Pulse 72  Temp(Src) 98.4 F (36.9 C) (Oral)  Resp 18  Ht 5' (1.524 m)  Wt 111 lb (50.349 kg)  BMI 21.68 kg/m2  SpO2 97% Physical Exam Physical Exam  Nursing note and vitals reviewed. Constitutional: Non-toxic, and in no acute distress Head: Normocephalic and atraumatic.  Mouth/Throat: Oropharynx is clear and moist.  Neck: Normal range of motion. Neck supple.  Cardiovascular: Normal rate and regular rhythm.   Pulmonary/Chest: Effort normal and breath sounds normal.  Abdominal: Soft. There is no tenderness. There is no rebound and no guarding.  Musculoskeletal: +1 pitting edema in LLE and trace edema in RLE. No significant discoloration or erythema, no significant warmth. Exquisite tenderness to palpation of skin.  Neurological: Alert, no facial droop, fluent speech, moves all extremities symmetrically, sensation to light touch in tact throughout Skin: Skin is warm and dry.  Psychiatric: Cooperative  ED Course  Procedures (including critical care time) Labs Review Labs Reviewed  CBC WITH DIFFERENTIAL/PLATELET - Abnormal; Notable for the following:    RBC 3.75 (*)    Hemoglobin 11.9 (*)    HCT 35.6 (*)    All other components within normal limits  COMPREHENSIVE METABOLIC PANEL - Abnormal; Notable for the following:    Chloride 112 (*)    Glucose, Bld 128 (*)    BUN 31 (*)    Creatinine, Ser 1.83 (*)    Total Protein 6.4 (*)    Albumin 3.4 (*)    GFR calc non Af Amer 24 (*)    GFR calc Af Amer 28 (*)    Anion gap 4 (*)    All other components within normal limits  BRAIN NATRIURETIC PEPTIDE - Abnormal; Notable for the following:    B Natriuretic Peptide 194.6 (*)    All other components within normal limits  SEDIMENTATION RATE  C-REACTIVE PROTEIN    Imaging Review Dg Chest 2 View  11/14/2015   CLINICAL DATA:  Left lower extremity pain. EXAM: CHEST  2 VIEW COMPARISON:  None. FINDINGS: The heart size and mediastinal contours are within normal limits. There is no evidence of pulmonary edema, consolidation, pneumothorax, nodule or pleural fluid. The visualized skeletal structures are unremarkable. IMPRESSION: No active cardiopulmonary disease. Electronically Signed   By: Aletta Edouard M.D.   On: 11/14/2015 17:36   Dg Tibia/fibula Left  11/14/2015  CLINICAL DATA:  Left lower extremity pain.  Cellulitis. EXAM: LEFT TIBIA AND FIBULA - 2 VIEW COMPARISON:  No recent prior. FINDINGS: Mild diffuse soft-tissue swelling. No acute bony abnormality identified. Diffuse osteopenia. Degenerative changes left knee and ankle. IMPRESSION: 1. Mild diffuse soft-tissue swelling. 2. Diffuse osteopenia. Degenerative changes left knee and  ankle. No acute bony abnormality identified. Electronically Signed   By: Marcello Moores  Register   On: 11/14/2015 17:36   Mr Tibia Fibula Left Wo Contrast  11/15/2015  CLINICAL DATA:  Increasingly severe left lower extremity pain for 3 weeks. Cellulitis. EXAM: MRI OF LOWER LEFT EXTREMITY WITHOUT CONTRAST TECHNIQUE: Multiplanar, multisequence MR imaging of the left lower leg was performed. No intravenous contrast was administered. COMPARISON:  Radiographs dated 11/14/2015 FINDINGS: There is circumferential subcutaneous edema in both lower legs, more prominent on the left than the right. The tibia and fibula appear normal.  No ankle or knee effusions. There is slight edema in the proximal aspect of the anterior compartment of the left lower leg just below the knee joint. There is a similar but less prominent appearance in the left anterior compartment. I doubt that this represents myositis. There is no bulging of the fascia to suggest a compartment syndrome. The other muscles in the lower legs appear normal. IMPRESSION: Subcutaneous edema in both lower extremities, left greater than right. This is  nonspecific. This is most often related to venous insufficiency, congestive heart failure, and fluid overload. However, cellulitis could create the same appearance. Electronically Signed   By: Lorriane Shire M.D.   On: 11/15/2015 08:33   I have personally reviewed and evaluated these images and lab results as part of my medical decision-making.   EKG Interpretation None      MDM   Final diagnoses:  Pain  Left leg pain  Edema of left lower extremity  Cellulitis of left lower extremity   Presenting with left great than right lower extremity edema and left leg pain ongoing for past 2-3 weeks. Afebrile, with stable vitals. No leukocytosis, normal inflammatory markers, and unremarkable X-ray makes osteomyelitis less likely and less consistent with cellulitis as well. With AKI, creatine of 1.8 from baseline of around 1, which likely contributes to swelling and she has has mildly elevated BNP without other clinical symptoms of heart failure. Discussed recheck of creatinine and potential ECHO with PCP as outpatient with family and patient. Family sent to ED for MRI and discussed that she clinically does not appear to have osteomyelitis and MRI seems unnecessary. Family expressed that they would really like to have this performed as this is what they were sent to ED for by their PCP. MRI pending at this time. If without signs of osteomyelitis or other serious infection, pt will continue to finish her antibiotics and follow-up with PCP.    Forde Dandy, MD 11/15/15 [phone]

## 2015-11-14 NOTE — ED Notes (Signed)
MD at bedside. 

## 2015-11-14 NOTE — ED Notes (Addendum)
Pt c/o increasing LLE pain x almost 3 weeks.  Pain score 9/10.  Pt's daughters reports that Pt has been seen by Ortho and started on prednisone and PCP started Pt on Cephalexin yesterday.  Daughter's report PCP diagnosed Pt w/ cellulitis and directed them to come to ED for a MRI.      Pt had a doppler yesterday which resulted negative.

## 2015-11-14 NOTE — ED Notes (Signed)
MRI Dept at [phone] was called and inquiry made on pt's MRI results---- staff stated that it is still being processed at this time.

## 2015-11-15 MED ORDER — HYDROCODONE-ACETAMINOPHEN 5-325 MG PO TABS: 0.5000 | ORAL_TABLET | Freq: Three times a day (TID) | ORAL | Status: DC | PRN

## 2015-11-15 NOTE — ED Provider Notes (Signed)
Patient visit shared.  Pt here with two weeks of LLE pain and swelling.  MRI concerning for cellulitis.  Pt nontoxic on exam.  Discussed continuing keflex (that was started yesterday) for cellulitis.  Discussed mild renal insuffiencey - she will discontinue prednisone as well as ibuprofen.  Discussed home care, close outpatient follow up and return precautions.  Rx for norco prn pain.    Quintella Reichert, MD 11/15/15 1131

## 2015-11-15 NOTE — Discharge Instructions (Signed)
Finish your antibiotics. Stop taking your prednisone as it can worsen leg swelling. Follow-up closely with your PCP as you will need a recheck on your kidney function and discuss need for ultrasound of your heart with your PCP then.  Your MRI looks like you have cellulitis or a skin infection in your leg.    Return for worsening symptoms, including fever, escalating pain, or any other symptoms concerning to you.  Edema Edema is an abnormal buildup of fluids in your bodytissues. Edema is somewhatdependent on gravity to pull the fluid to the lowest place in your body. That makes the condition more common in the legs and thighs (lower extremities). Painless swelling of the feet and ankles is common and becomes more likely as you get older. It is also common in looser tissues, like around your eyes.  When the affected area is squeezed, the fluid may move out of that spot and leave a dent for a few moments. This dent is called pitting.  CAUSES  There are many possible causes of edema. Eating too much salt and being on your feet or sitting for a long time can cause edema in your legs and ankles. Hot weather may make edema worse. Common medical causes of edema include:  Heart failure.  Liver disease.  Kidney disease.  Weak blood vessels in your legs.  Cancer.  An injury.  Pregnancy.  Some medications.  Obesity. SYMPTOMS  Edema is usually painless.Your skin may look swollen or shiny.  DIAGNOSIS  Your health care provider may be able to diagnose edema by asking about your medical history and doing a physical exam. You may need to have tests such as X-rays, an electrocardiogram, or blood tests to check for medical conditions that may cause edema.  TREATMENT  Edema treatment depends on the cause. If you have heart, liver, or kidney disease, you need the treatment appropriate for these conditions. General treatment may include:  Elevation of the affected body part above the level of your  heart.  Compression of the affected body part. Pressure from elastic bandages or support stockings squeezes the tissues and forces fluid back into the blood vessels. This keeps fluid from entering the tissues.  Restriction of fluid and salt intake.  Use of a water pill (diuretic). These medications are appropriate only for some types of edema. They pull fluid out of your body and make you urinate more often. This gets rid of fluid and reduces swelling, but diuretics can have side effects. Only use diuretics as directed by your health care provider. HOME CARE INSTRUCTIONS   Keep the affected body part above the level of your heart when you are lying down.   Do not sit still or stand for prolonged periods.   Do not put anything directly under your knees when lying down.  Do not wear constricting clothing or garters on your upper legs.   Exercise your legs to work the fluid back into your blood vessels. This may help the swelling go down.   Wear elastic bandages or support stockings to reduce ankle swelling as directed by your health care provider.   Eat a low-salt diet to reduce fluid if your health care provider recommends it.   Only take medicines as directed by your health care provider. SEEK MEDICAL CARE IF:   Your edema is not responding to treatment.  You have heart, liver, or kidney disease and notice symptoms of edema.  You have edema in your legs that does not improve  after elevating them.   You have sudden and unexplained weight gain. SEEK IMMEDIATE MEDICAL CARE IF:   You develop shortness of breath or chest pain.   You cannot breathe when you lie down.  You develop pain, redness, or warmth in the swollen areas.   You have heart, liver, or kidney disease and suddenly get edema.  You have a fever and your symptoms suddenly get worse. MAKE SURE YOU:   Understand these instructions.  Will watch your condition.  Will get help right away if you are not  doing well or get worse.   This information is not intended to replace advice given to you by your health care provider. Make sure you discuss any questions you have with your health care provider.   Document Released: 05/03/2005 Document Revised: 05/24/2014 Document Reviewed: 02/23/2013 Elsevier Interactive Patient Education [employer].

## 2015-12-23 ENCOUNTER — Ambulatory Visit: Payer: Medicare Other | Admitting: Cardiovascular Disease

## 2015-12-31 ENCOUNTER — Telehealth: Payer: Self-pay | Admitting: Cardiovascular Disease

## 2015-12-31 NOTE — Telephone Encounter (Signed)
Called spoke with [place] @ [facility] she was looking for 12/23/15 Ov Note  Made her aware patient cancelled appt.

## 2015-12-31 NOTE — Telephone Encounter (Signed)
New Message  Tammy from [facility] is calling to get the pt information from 8.8.17 appt.  Please follow up with Tammy. Thanks!

## 2016-04-17 ENCOUNTER — Emergency Department (HOSPITAL_COMMUNITY)
Admission: EM | Admit: 2016-04-17 | Discharge: 2016-04-17 | Disposition: A | Discharge status: Home / Self Care | Payer: Medicare Other | Attending: Emergency Medicine | Admitting: Emergency Medicine

## 2016-04-17 ENCOUNTER — Emergency Department (HOSPITAL_COMMUNITY): Payer: Medicare Other

## 2016-04-17 ENCOUNTER — Encounter (HOSPITAL_COMMUNITY): Payer: Self-pay | Admitting: Emergency Medicine

## 2016-04-17 DIAGNOSIS — I10 Essential (primary) hypertension: Secondary | ICD-10-CM | POA: Insufficient documentation

## 2016-04-17 DIAGNOSIS — J40 Bronchitis, not specified as acute or chronic: Secondary | ICD-10-CM | POA: Insufficient documentation

## 2016-04-17 DIAGNOSIS — A3790 Whooping cough, unspecified species without pneumonia: Secondary | ICD-10-CM | POA: Insufficient documentation

## 2016-04-17 DIAGNOSIS — R0602 Shortness of breath: Secondary | ICD-10-CM | POA: Diagnosis present

## 2016-04-17 DIAGNOSIS — Z8551 Personal history of malignant neoplasm of bladder: Secondary | ICD-10-CM | POA: Insufficient documentation

## 2016-04-17 LAB — CBC WITH DIFFERENTIAL/PLATELET
BASOS ABS: 0 10*3/uL (ref 0.0–0.1)
BASOS PCT: 0 %
EOS ABS: 0 10*3/uL (ref 0.0–0.7)
Eosinophils Relative: 1 %
HCT: 32.3 % — ABNORMAL LOW (ref 36.0–46.0)
HEMOGLOBIN: 10.7 g/dL — AB (ref 12.0–15.0)
LYMPHS ABS: 1.2 10*3/uL (ref 0.7–4.0)
Lymphocytes Relative: 19 %
MCH: 30.1 pg (ref 26.0–34.0)
MCHC: 33.1 g/dL (ref 30.0–36.0)
MCV: 91 fL (ref 78.0–100.0)
Monocytes Absolute: 0.7 10*3/uL (ref 0.1–1.0)
Monocytes Relative: 11 %
NEUTROS PCT: 69 %
Neutro Abs: 4.6 10*3/uL (ref 1.7–7.7)
PLATELETS: 227 10*3/uL (ref 150–400)
RBC: 3.55 MIL/uL — AB (ref 3.87–5.11)
RDW: 13.6 % (ref 11.5–15.5)
WBC: 6.6 10*3/uL (ref 4.0–10.5)

## 2016-04-17 LAB — BASIC METABOLIC PANEL
Anion gap: 12 (ref 5–15)
BUN: 23 mg/dL — AB (ref 6–20)
CALCIUM: 9.5 mg/dL (ref 8.9–10.3)
CO2: 20 mmol/L — ABNORMAL LOW (ref 22–32)
CREATININE: 1.66 mg/dL — AB (ref 0.44–1.00)
Chloride: 107 mmol/L (ref 101–111)
GFR, EST AFRICAN AMERICAN: 31 mL/min — AB (ref >60)
GFR, EST NON AFRICAN AMERICAN: 27 mL/min — AB (ref >60)
Glucose, Bld: 94 mg/dL (ref 65–99)
Potassium: 3.5 mmol/L (ref 3.5–5.1)
SODIUM: 139 mmol/L (ref 135–145)

## 2016-04-17 LAB — BRAIN NATRIURETIC PEPTIDE: B NATRIURETIC PEPTIDE 5: 48.8 pg/mL (ref 0.0–100.0)

## 2016-04-17 LAB — I-STAT TROPONIN, ED: TROPONIN I, POC: 0.01 ng/mL (ref 0.00–0.08)

## 2016-04-17 MED ORDER — HYDROCOD POLST-CPM POLST ER 10-8 MG/5ML PO SUER
5.0000 mL | Freq: Once | ORAL | Status: AC
  Administered 2016-04-17: 5 mL via ORAL
  Filled 2016-04-17: qty 5

## 2016-04-17 MED ORDER — AZITHROMYCIN 250 MG PO TABS
500.0000 mg | ORAL_TABLET | Freq: Once | ORAL | Status: AC
  Administered 2016-04-17: 500 mg via ORAL
  Filled 2016-04-17: qty 2

## 2016-04-17 MED ORDER — AZITHROMYCIN 250 MG PO TABS: 250.0000 mg | ORAL_TABLET | Freq: Every day | ORAL | 0 refills | Status: AC

## 2016-04-17 MED ORDER — ONDANSETRON HCL 4 MG/2ML IJ SOLN
4.0000 mg | Freq: Once | INTRAMUSCULAR | Status: AC
  Administered 2016-04-17: 4 mg via INTRAVENOUS
  Filled 2016-04-17: qty 2

## 2016-04-17 MED ORDER — HYDROCOD POLST-CPM POLST ER 10-8 MG/5ML PO SUER: 5.0000 mL | Freq: Two times a day (BID) | ORAL | 0 refills | Status: DC | PRN

## 2016-04-17 MED ORDER — ASPIRIN 81 MG PO CHEW
324.0000 mg | CHEWABLE_TABLET | Freq: Once | ORAL | Status: AC
  Administered 2016-04-17: 324 mg via ORAL
  Filled 2016-04-17: qty 4

## 2016-04-17 NOTE — ED Provider Notes (Signed)
I saw and evaluated the patient, reviewed the resident's note and I agree with the findings and plan with the following exceptions.   82 year old female here with cough and dyspnea. Patient stated that she had a cough about a week and the last night it is significantly worse. She had multiple paroxysmal episodes of significant coughing, no posttussive emesis. No fever. She does take Lasix every other day. No chest pain or back pain. No productive cough. Exam she had multiple paroxysms of cough with relatively clear lung sounds. Heart rate in the 90s blood pressure normal oxygen normal appears to become for limiting coughing fits. Lower extremity without any edema. No appreciable S3 or JVD. Concern for possible pertussis vs other viral infection vs cardiac.  Will treat symptomatically, check pertussis, cardiac workup and dispo appropriately.    EKG Interpretation  Date/Time:  Saturday April 17 2016 15:31:15 EST Ventricular Rate:  82 PR Interval:    QRS Duration: 85 QT Interval:  385 QTC Calculation: 450 R Axis:   76 Text Interpretation:  Sinus rhythm Nonspecific T abnrm, anterolateral leads No significant change since last tracing Confirmed by [facility] MD, Ashlynne Shetterly [phone]) on 04/17/2016 4:06:27 PM         Merrily Pew, MD 04/18/16 [phone]

## 2016-04-17 NOTE — ED Notes (Signed)
Pt returned from xray, placed back on monitor.

## 2016-04-17 NOTE — ED Triage Notes (Signed)
Pt coming by [facility] from Urgent care. Pt was reported to have SOB, dyspnea, and non-productive cough x1 week per EMS. Per Urgent care pt was sating in the 70s but lungs were clear. Pt was given 5 mg albuterol by neb at urgent care. Pt has hx of anxiety. Per EMS pt's breathing improved on EMS. Pt sating 99% on RA for EMS.

## 2016-04-17 NOTE — ED Provider Notes (Signed)
[facility] DEPT Provider Note   CSN: [id number] Arrival date & time: 04/17/16  1529     History   Chief Complaint Chief Complaint  Patient presents with  . Shortness of Breath    HPI [name] is a 82 y.o. female.   Shortness of Breath  This is a new problem. The average episode lasts 1 day. The problem occurs frequently.The current episode started yesterday. The problem has been gradually worsening. Associated symptoms include cough, orthopnea and chest pain (with coughing). Pertinent negatives include no fever, no rhinorrhea, no sore throat, no ear pain, no sputum production, no wheezing, no vomiting, no abdominal pain, no rash, no leg pain and no leg swelling. It is unknown what precipitated the problem. She has tried beta-agonist inhalers for the symptoms. The treatment provided no relief. Associated medical issues include heart failure. Associated medical issues do not include COPD.    Past Medical History:  Diagnosis Date  . Bladder cancer ([place])   . Dementia   . Hypertension   . Kidney stones   . Osteoporosis   . UTI (lower urinary tract infection)     Patient Active Problem List   Diagnosis Date Noted  . Ileus ([place])   . Abdominal distension   . Uncontrollable vomiting   . Vomiting 09/29/2015  . Sepsis ([place]) 09/29/2015  . UTI (lower urinary tract infection) 08/13/2015  . Acute encephalopathy 08/13/2015  . Cutaneous candidiasis 05/13/2012  . Hypokalemia 05/13/2012  . Hypertension 05/11/2012  . Diarrhea 05/11/2012  . Nausea 05/11/2012  . Anorexia 05/11/2012  . Fever 05/10/2012  . Pyelonephritis 05/10/2012  . Acute renal failure ([place]) 05/10/2012    Past Surgical History:  Procedure Laterality Date  . ABDOMINAL HYSTERECTOMY    . APPENDECTOMY    . bladder removed due to cancer     . CHOLECYSTECTOMY    . REVISION UROSTOMY CUTANEOUS    . TOTAL SHOULDER ARTHROPLASTY      OB History    No data available       Home Medications    Prior to  Admission medications   Medication Sig Start Date End Date Taking? Authorizing Provider  benzonatate (TESSALON) 200 MG capsule Take 200 mg by mouth 3 (three) times daily as needed for cough.   Yes Historical Provider, MD  cetirizine (ZYRTEC) 10 MG tablet Take 10 mg by mouth at bedtime.    Yes Historical Provider, MD  clonazepam (KLONOPIN) 0.125 MG disintegrating tablet Take 0.125 mg by mouth daily as needed (anxiety).    Yes Historical Provider, MD  cloNIDine (CATAPRES) 0.1 MG tablet Take 0.1 mg by mouth at bedtime.    Yes Historical Provider, MD  Cranberry (AZO-CRANBERRY) 450 MG TABS Take 1 tablet by mouth 2 (two) times daily.    Yes Historical Provider, MD  donepezil (ARICEPT) 10 MG tablet Take 10 mg by mouth at bedtime.   Yes Historical Provider, MD  furosemide (LASIX) 20 MG tablet Take 20 mg by mouth daily.   Yes Historical Provider, MD  memantine (NAMENDA) 10 MG tablet Take 10 mg by mouth 2 (two) times daily.   Yes Historical Provider, MD  mirtazapine (REMERON) 7.5 MG tablet Take 15 mg by mouth at bedtime.   Yes Historical Provider, MD  nitrofurantoin (MACRODANTIN) 50 MG capsule Take 50 mg by mouth at bedtime.   Yes Historical Provider, MD  omeprazole (PRILOSEC) 40 MG capsule Take 40 mg by mouth every evening.   Yes Historical Provider, MD  potassium chloride SA (K-DUR,KLOR-CON)  20 MEQ tablet Take 20 mEq by mouth 2 (two) times daily.   Yes Historical Provider, MD  saccharomyces boulardii (FLORASTOR) 250 MG capsule Take 1 capsule (250 mg total) by mouth 2 (two) times daily. 08/19/15  Yes Charlynne Cousins, MD  acetaminophen (TYLENOL) 325 MG tablet Take 650 mg by mouth every 6 (six) hours as needed for moderate pain.     Historical Provider, MD  azithromycin (ZITHROMAX) 250 MG tablet Take 1 tablet (250 mg total) by mouth daily. Take first 2 tablets together, then 1 every day until finished. 04/17/16 04/21/16  Dewaine Conger, MD  budesonide-formoterol (SYMBICORT) 80-4.5 MCG/ACT inhaler Inhale 2 puffs  into the lungs 2 (two) times daily as needed (shortness of breath).     Historical Provider, MD  cephALEXin (KEFLEX) 500 MG capsule Take 500 mg by mouth 3 (three) times daily. Started 06/29 for 10 days    Historical Provider, MD  chlorpheniramine-HYDROcodone (TUSSIONEX PENNKINETIC ER) 10-8 MG/5ML SUER Take 5 mLs by mouth every 12 (twelve) hours as needed for cough. 04/17/16   Dewaine Conger, MD  fluticasone (FLONASE) 50 MCG/ACT nasal spray Place 2 sprays into both nostrils daily as needed for allergies or rhinitis.    Historical Provider, MD  HYDROcodone-acetaminophen (NORCO/VICODIN) 5-325 MG tablet Take 0.5-1 tablets by mouth every 8 (eight) hours as needed. Patient not taking: Reported on 04/17/2016 11/15/15   Quintella Reichert, MD  meclizine (ANTIVERT) 12.5 MG tablet Take 12.5 mg by mouth 3 (three) times daily as needed for dizziness.  07/07/15   Historical Provider, MD  ondansetron (ZOFRAN-ODT) 4 MG disintegrating tablet Take 4 mg by mouth 2 (two) times daily as needed for nausea or vomiting.    Historical Provider, MD    Family History Family History  Problem Relation Age of Onset  . Other Mother   . Heart attack Father     Social History Social History  Substance Use Topics  . Smoking status: Never Smoker  . Smokeless tobacco: Never Used  . Alcohol use No     Allergies   Codeine and Metoclopramide   Review of Systems Review of Systems  Constitutional: Negative for chills and fever.  HENT: Negative for ear pain, rhinorrhea and sore throat.   Eyes: Negative for pain and visual disturbance.  Respiratory: Positive for cough and shortness of breath. Negative for sputum production and wheezing.   Cardiovascular: Positive for chest pain (with coughing) and orthopnea. Negative for palpitations and leg swelling.  Gastrointestinal: Negative for abdominal pain and vomiting.  Genitourinary: Negative for dysuria and hematuria.  Musculoskeletal: Negative for arthralgias and back pain.  Skin:  Negative for color change and rash.  Neurological: Negative for seizures and syncope.  All other systems reviewed and are negative.    Physical Exam Updated Vital Signs BP 110/60 (BP Location: Left Arm)   Pulse 77   Temp 98 F (36.7 C) (Oral)   Resp 15   SpO2 94%   Physical Exam  Constitutional: She appears well-developed and well-nourished. No distress.  HENT:  Head: Normocephalic and atraumatic.  Eyes: Conjunctivae are normal.  Neck: Neck supple.  Cardiovascular: Normal rate, regular rhythm and normal heart sounds.  Exam reveals no gallop.   No murmur heard. Pulmonary/Chest: Effort normal and breath sounds normal. No respiratory distress. She has no wheezes. She has no rales. She exhibits no tenderness.  Abdominal: Soft. There is no tenderness.  Musculoskeletal: Normal range of motion. She exhibits no edema or deformity.  Neurological: She is alert. She exhibits  normal muscle tone.  Skin: Skin is warm and dry. Capillary refill takes less than 2 seconds.  Psychiatric: She has a normal mood and affect.  Nursing note and vitals reviewed.    ED Treatments / Results  Labs (all labs ordered are listed, but only abnormal results are displayed) Labs Reviewed  CBC WITH DIFFERENTIAL/PLATELET - Abnormal; Notable for the following:       Result Value   RBC 3.55 (*)    Hemoglobin 10.7 (*)    HCT 32.3 (*)    All other components within normal limits  BASIC METABOLIC PANEL - Abnormal; Notable for the following:    CO2 20 (*)    BUN 23 (*)    Creatinine, Ser 1.66 (*)    GFR calc non Af Amer 27 (*)    GFR calc Af Amer 31 (*)    All other components within normal limits  BORDETELLA PERTUSSIS PCR  BRAIN NATRIURETIC PEPTIDE  I-STAT TROPOININ, ED  I-STAT TROPOININ, ED    EKG  EKG Interpretation  Date/Time:  Saturday April 17 2016 15:31:15 EST Ventricular Rate:  82 PR Interval:    QRS Duration: 85 QT Interval:  385 QTC Calculation: 450 R Axis:   76 Text  Interpretation:  Sinus rhythm Nonspecific T abnrm, anterolateral leads No significant change since last tracing Confirmed by [facility] MD, Corene Cornea [phone]) on 04/17/2016 4:06:27 PM       Radiology Dg Chest 2 View  Result Date: 04/17/2016 CLINICAL DATA:  Pt has had cough and congestion for 3 days with SOB. Non smoker. Hx of HTN, EXAM: CHEST  2 VIEW COMPARISON:  04/17/2016 FINDINGS: The cardiac silhouette is normal in size and configuration. No mediastinal or hilar masses. No evidence of adenopathy. Lungs are mildly hyperexpanded. There is upper lobe scarring, greater on the right. There is mild lung base scarring. No evidence of pneumonia or pulmonary edema. No pleural effusion or pneumothorax. Right shoulder prosthesis appears well aligned. Skeletal structures are demineralized. IMPRESSION: No acute cardiopulmonary disease. Electronically Signed   By: Lajean Manes M.D.   On: 04/17/2016 16:33    Procedures Procedures (including critical care time)  Medications Ordered in ED Medications  azithromycin (ZITHROMAX) tablet 500 mg (not administered)  aspirin chewable tablet 324 mg (324 mg Oral Given 04/17/16 1619)  chlorpheniramine-HYDROcodone (TUSSIONEX) 10-8 MG/5ML suspension 5 mL (5 mLs Oral Given 04/17/16 1619)  ondansetron (ZOFRAN) injection 4 mg (4 mg Intravenous Given 04/17/16 1619)     Initial Impression / Assessment and Plan / ED Course  I have reviewed the triage vital signs and the nursing notes.  Pertinent labs & imaging results that were available during my care of the patient were reviewed by me and considered in my medical decision making (see chart for details).  Clinical Course     82 year old female comes to [place] today with worsening cough over the last 24 hours. She's had intermittent cough couple weeks and now is having significant tussive episodes. She is intermittent nausea with it as well. No vomiting. She has pain with coughing no chest pain at rest. She is slightly short of  breath with exertion. She does have a history of fluid overload, family thinks in the setting of heart failure. On exam she is no JVD her lungs do not sound like they have rails or rhonchi. Her lower extremities don't have significant edema. She is afebrile vital signs are stable. She has normal heart sounds. Normal saturation on room air. Concerns for infectious process versus  heart failure exacerbation. Will get CBC chest x-ray BMP BNP, pertussis PCR, troponin.  EKG reviewed by me shows normal sinus rhythm with no acute ischemia interval abnormality or arrhythmia. 100 is negative BNP is not elevated chest x-ray shows no acute cardiopulmonary pathology. CBC and BMP are unremarkable for any causes of her symptoms. She is safe for discharge home, with treatment for Bordetella, azithromycin. She will follow-up with her doctor. Tussionex as prescribed.  Final Clinical Impressions(s) / ED Diagnoses   Final diagnoses:  Bronchitis  Whooping cough    New Prescriptions New Prescriptions   AZITHROMYCIN (ZITHROMAX) 250 MG TABLET    Take 1 tablet (250 mg total) by mouth daily. Take first 2 tablets together, then 1 every day until finished.   CHLORPHENIRAMINE-HYDROCODONE (TUSSIONEX PENNKINETIC ER) 10-8 MG/5ML SUER    Take 5 mLs by mouth every 12 (twelve) hours as needed for cough.     Dewaine Conger, MD 04/17/16 1844    Merrily Pew, MD 04/18/16 [phone]

## 2016-04-18 LAB — BORDETELLA PERTUSSIS PCR
B parapertussis, DNA: NEGATIVE
B pertussis, DNA: NEGATIVE

## 2016-07-03 ENCOUNTER — Encounter (HOSPITAL_COMMUNITY): Payer: Self-pay | Admitting: Emergency Medicine

## 2016-07-03 ENCOUNTER — Emergency Department (HOSPITAL_COMMUNITY): Payer: Medicare Other

## 2016-07-03 ENCOUNTER — Emergency Department (HOSPITAL_COMMUNITY)
Admission: EM | Admit: 2016-07-03 | Discharge: 2016-07-04 | Disposition: A | Discharge status: Home / Self Care | Payer: Medicare Other | Attending: Emergency Medicine | Admitting: Emergency Medicine

## 2016-07-03 DIAGNOSIS — R740 Nonspecific elevation of levels of transaminase and lactic acid dehydrogenase [LDH]: Secondary | ICD-10-CM | POA: Insufficient documentation

## 2016-07-03 DIAGNOSIS — I10 Essential (primary) hypertension: Secondary | ICD-10-CM | POA: Insufficient documentation

## 2016-07-03 DIAGNOSIS — Z8551 Personal history of malignant neoplasm of bladder: Secondary | ICD-10-CM | POA: Diagnosis not present

## 2016-07-03 DIAGNOSIS — R7401 Elevation of levels of liver transaminase levels: Secondary | ICD-10-CM

## 2016-07-03 DIAGNOSIS — Z96619 Presence of unspecified artificial shoulder joint: Secondary | ICD-10-CM | POA: Insufficient documentation

## 2016-07-03 DIAGNOSIS — R112 Nausea with vomiting, unspecified: Secondary | ICD-10-CM | POA: Diagnosis present

## 2016-07-03 DIAGNOSIS — Z79899 Other long term (current) drug therapy: Secondary | ICD-10-CM | POA: Diagnosis not present

## 2016-07-03 DIAGNOSIS — R109 Unspecified abdominal pain: Secondary | ICD-10-CM

## 2016-07-03 LAB — COMPREHENSIVE METABOLIC PANEL
ALT: 129 U/L — ABNORMAL HIGH (ref 14–54)
AST: 124 U/L — ABNORMAL HIGH (ref 15–41)
Albumin: 3.5 g/dL (ref 3.5–5.0)
Alkaline Phosphatase: 111 U/L (ref 38–126)
Anion gap: 8 (ref 5–15)
BILIRUBIN TOTAL: 0.8 mg/dL (ref 0.3–1.2)
BUN: 23 mg/dL — ABNORMAL HIGH (ref 6–20)
CHLORIDE: 110 mmol/L (ref 101–111)
CO2: 20 mmol/L — ABNORMAL LOW (ref 22–32)
CREATININE: 1.74 mg/dL — AB (ref 0.44–1.00)
Calcium: 9.7 mg/dL (ref 8.9–10.3)
GFR, EST AFRICAN AMERICAN: 29 mL/min — AB (ref >60)
GFR, EST NON AFRICAN AMERICAN: 25 mL/min — AB (ref >60)
Glucose, Bld: 150 mg/dL — ABNORMAL HIGH (ref 65–99)
POTASSIUM: 3.6 mmol/L (ref 3.5–5.1)
Sodium: 138 mmol/L (ref 135–145)
TOTAL PROTEIN: 7.3 g/dL (ref 6.5–8.1)

## 2016-07-03 LAB — URINALYSIS, ROUTINE W REFLEX MICROSCOPIC
Bilirubin Urine: NEGATIVE
GLUCOSE, UA: NEGATIVE mg/dL
Hgb urine dipstick: NEGATIVE
KETONES UR: NEGATIVE mg/dL
Nitrite: NEGATIVE
PH: 6 (ref 5.0–8.0)
Protein, ur: NEGATIVE mg/dL
SPECIFIC GRAVITY, URINE: 1.01 (ref 1.005–1.030)
SQUAMOUS EPITHELIAL / LPF: NONE SEEN

## 2016-07-03 LAB — CBC WITH DIFFERENTIAL/PLATELET
Basophils Absolute: 0 10*3/uL (ref 0.0–0.1)
Basophils Relative: 0 %
Eosinophils Absolute: 0.1 10*3/uL (ref 0.0–0.7)
Eosinophils Relative: 1 %
HEMATOCRIT: 35.4 % — AB (ref 36.0–46.0)
Hemoglobin: 12.1 g/dL (ref 12.0–15.0)
LYMPHS ABS: 1.6 10*3/uL (ref 0.7–4.0)
LYMPHS PCT: 15 %
MCH: 31.4 pg (ref 26.0–34.0)
MCHC: 34.2 g/dL (ref 30.0–36.0)
MCV: 91.9 fL (ref 78.0–100.0)
MONO ABS: 1 10*3/uL (ref 0.1–1.0)
MONOS PCT: 10 %
Neutro Abs: 8.2 10*3/uL — ABNORMAL HIGH (ref 1.7–7.7)
Neutrophils Relative %: 74 %
PLATELETS: 247 10*3/uL (ref 150–400)
RBC: 3.85 MIL/uL — ABNORMAL LOW (ref 3.87–5.11)
RDW: 13.8 % (ref 11.5–15.5)
WBC: 10.9 10*3/uL — ABNORMAL HIGH (ref 4.0–10.5)

## 2016-07-03 LAB — I-STAT CG4 LACTIC ACID, ED: LACTIC ACID, VENOUS: 1.91 mmol/L — AB (ref 0.5–1.9)

## 2016-07-03 LAB — LIPASE, BLOOD: LIPASE: 49 U/L (ref 11–51)

## 2016-07-03 MED ORDER — SODIUM CHLORIDE 0.9 % IV BOLUS (SEPSIS)
1000.0000 mL | Freq: Once | INTRAVENOUS | Status: AC
  Administered 2016-07-03: 1000 mL via INTRAVENOUS

## 2016-07-03 MED ORDER — ONDANSETRON HCL 4 MG/2ML IJ SOLN
4.0000 mg | Freq: Once | INTRAMUSCULAR | Status: AC
  Administered 2016-07-03: 4 mg via INTRAVENOUS
  Filled 2016-07-03: qty 2

## 2016-07-03 MED ORDER — IOPAMIDOL (ISOVUE-300) INJECTION 61%
15.0000 mL | Freq: Once | INTRAVENOUS | Status: AC | PRN
  Administered 2016-07-03: 15 mL via ORAL

## 2016-07-03 MED ORDER — FENTANYL CITRATE (PF) 100 MCG/2ML IJ SOLN
50.0000 ug | Freq: Once | INTRAMUSCULAR | Status: AC
  Administered 2016-07-03: 50 ug via INTRAVENOUS
  Filled 2016-07-03: qty 2

## 2016-07-03 NOTE — ED Provider Notes (Signed)
[facility] DEPT Provider Note   CSN: [id number] Arrival date & time: 07/03/16  2155     History   Chief Complaint Chief Complaint  Patient presents with  . Emesis  . Elevated Hepatic Enzymes    HPI [name] is a 83 y.o. female. Diagnosed with UTI by PCP yesterday. Started having emesis today. Spoke with PCP who mentioned that she had elevated LFTs and to present to the ED for evaluation. The history is provided by the patient and a relative.  Emesis   This is a new problem. The current episode started 3 to 5 hours ago. The problem occurs 2 to 4 times per day. The problem has not changed since onset.There has been no fever. Associated symptoms include abdominal pain (intermittent today; related to emesis) and chills. Pertinent negatives include no diarrhea.    Past Medical History:  Diagnosis Date  . Bladder cancer ([place])   . Dementia   . Hypertension   . Kidney stones   . Osteoporosis   . UTI (lower urinary tract infection)     Patient Active Problem List   Diagnosis Date Noted  . Ileus ([place])   . Abdominal distension   . Uncontrollable vomiting   . Vomiting 09/29/2015  . Sepsis ([place]) 09/29/2015  . UTI (lower urinary tract infection) 08/13/2015  . Acute encephalopathy 08/13/2015  . Cutaneous candidiasis 05/13/2012  . Hypokalemia 05/13/2012  . Hypertension 05/11/2012  . Diarrhea 05/11/2012  . Nausea 05/11/2012  . Anorexia 05/11/2012  . Fever 05/10/2012  . Pyelonephritis 05/10/2012  . Acute renal failure ([place]) 05/10/2012    Past Surgical History:  Procedure Laterality Date  . ABDOMINAL HYSTERECTOMY    . APPENDECTOMY    . bladder removed due to cancer     . CHOLECYSTECTOMY    . REVISION UROSTOMY CUTANEOUS    . TOTAL SHOULDER ARTHROPLASTY      OB History    No data available       Home Medications    Prior to Admission medications   Medication Sig Start Date End Date Taking? Authorizing Provider  cetirizine (ZYRTEC) 10 MG tablet Take 10 mg  by mouth at bedtime.    Yes Historical Provider, MD  ciprofloxacin (CIPRO) 500 MG tablet Take 1 tablet by mouth 2 (two) times daily. 07/02/16  Yes Historical Provider, MD  clonazepam (KLONOPIN) 0.125 MG disintegrating tablet Take 0.125 mg by mouth at bedtime.    Yes Historical Provider, MD  cloNIDine (CATAPRES) 0.1 MG tablet Take 0.1 mg by mouth at bedtime.    Yes Historical Provider, MD  Cranberry (AZO-CRANBERRY) 450 MG TABS Take 1 tablet by mouth daily.    Yes Historical Provider, MD  donepezil (ARICEPT) 10 MG tablet Take 10 mg by mouth at bedtime.   Yes Historical Provider, MD  fluticasone (FLONASE) 50 MCG/ACT nasal spray Place 2 sprays into both nostrils daily.    Yes Historical Provider, MD  furosemide (LASIX) 20 MG tablet Take 20 mg by mouth every other day.    Yes Historical Provider, MD  meclizine (ANTIVERT) 12.5 MG tablet Take 12.5 mg by mouth 3 (three) times daily as needed for dizziness.  07/07/15  Yes Historical Provider, MD  memantine (NAMENDA) 10 MG tablet Take 10 mg by mouth 2 (two) times daily.   Yes Historical Provider, MD  mirtazapine (REMERON) 7.5 MG tablet Take 15 mg by mouth at bedtime.   Yes Historical Provider, MD  nitrofurantoin (MACRODANTIN) 50 MG capsule Take 50 mg by mouth at  bedtime.   Yes Historical Provider, MD  omeprazole (PRILOSEC) 40 MG capsule Take 40 mg by mouth every evening.   Yes Historical Provider, MD  ondansetron (ZOFRAN-ODT) 4 MG disintegrating tablet Take 4 mg by mouth 2 (two) times daily as needed for nausea or vomiting.   Yes Historical Provider, MD  potassium chloride SA (K-DUR,KLOR-CON) 20 MEQ tablet Take 20 mEq by mouth every other day.    Yes Historical Provider, MD  saccharomyces boulardii (FLORASTOR) 250 MG capsule Take 1 capsule (250 mg total) by mouth 2 (two) times daily. 08/19/15  Yes Charlynne Cousins, MD  chlorpheniramine-HYDROcodone [facility] PENNKINETIC ER) 10-8 MG/5ML SUER Take 5 mLs by mouth every 12 (twelve) hours as needed for  cough. Patient not taking: Reported on 07/03/2016 04/17/16   Dewaine Conger, MD  HYDROcodone-acetaminophen (NORCO/VICODIN) 5-325 MG tablet Take 0.5-1 tablets by mouth every 8 (eight) hours as needed. Patient not taking: Reported on 04/17/2016 11/15/15   Quintella Reichert, MD    Family History Family History  Problem Relation Age of Onset  . Other Mother   . Heart attack Father     Social History Social History  Substance Use Topics  . Smoking status: Never Smoker  . Smokeless tobacco: Never Used  . Alcohol use No     Allergies   Codeine; Metoclopramide; and Other   Review of Systems Review of Systems  Constitutional: Positive for chills.  Gastrointestinal: Positive for abdominal pain (intermittent today; related to emesis) and vomiting. Negative for diarrhea.  Ten systems are reviewed and are negative for acute change except as noted in the HPI    Physical Exam Updated Vital Signs BP 148/63 (BP Location: Right Arm)   Pulse 84   Temp 99 F (37.2 C) (Oral)   Resp 18   SpO2 98%   Physical Exam  Constitutional: She is oriented to person, place, and time. She appears well-developed and well-nourished. No distress.  HENT:  Head: Normocephalic and atraumatic.  Nose: Nose normal.  Eyes: Conjunctivae and EOM are normal. Pupils are equal, round, and reactive to light. Right eye exhibits no discharge. Left eye exhibits no discharge. No scleral icterus.  Neck: Normal range of motion. Neck supple.  Cardiovascular: Normal rate and regular rhythm.  Exam reveals no gallop and no friction rub.   No murmur heard. Pulmonary/Chest: Effort normal and breath sounds normal. No stridor. No respiratory distress. She has no rales.  Abdominal: Soft. She exhibits distension. There is no tenderness. There is CVA tenderness.  Musculoskeletal: She exhibits no edema or tenderness.  Neurological: She is alert and oriented to person, place, and time.  Skin: Skin is warm and dry. No rash noted. She is not  diaphoretic. No erythema.  Psychiatric: She has a normal mood and affect.  Vitals reviewed.    ED Treatments / Results  Labs (all labs ordered are listed, but only abnormal results are displayed) Labs Reviewed  CBC WITH DIFFERENTIAL/PLATELET - Abnormal; Notable for the following:       Result Value   WBC 10.9 (*)    RBC 3.85 (*)    HCT 35.4 (*)    Neutro Abs 8.2 (*)    All other components within normal limits  COMPREHENSIVE METABOLIC PANEL - Abnormal; Notable for the following:    CO2 20 (*)    Glucose, Bld 150 (*)    BUN 23 (*)    Creatinine, Ser 1.74 (*)    AST 124 (*)    ALT 129 (*)  GFR calc non Af Amer 25 (*)    GFR calc Af Amer 29 (*)    All other components within normal limits  URINALYSIS, ROUTINE W REFLEX MICROSCOPIC - Abnormal; Notable for the following:    Leukocytes, UA LARGE (*)    Bacteria, UA RARE (*)    All other components within normal limits  I-STAT CG4 LACTIC ACID, ED - Abnormal; Notable for the following:    Lactic Acid, Venous 1.91 (*)    All other components within normal limits  URINE CULTURE  LIPASE, BLOOD  I-STAT CG4 LACTIC ACID, ED    EKG  EKG Interpretation None       Radiology Ct Abdomen Pelvis Wo Contrast  Result Date: 07/04/2016 CLINICAL DATA:  Urinary tract infection.  Elevated liver enzymes. EXAM: CT ABDOMEN AND PELVIS WITHOUT CONTRAST TECHNIQUE: Multidetector CT imaging of the abdomen and pelvis was performed following the standard protocol without IV contrast. COMPARISON:  11/21/2015 CT FINDINGS: Lower chest: Normal sized cardiac chambers without pericardial effusion. Coronary arteriosclerosis. Scarring and/or atelectasis at each lung base. Hepatobiliary: Cholecystectomy. Calcified granulomata within the liver. Chronic stable mild intrahepatic ductal dilatation in the left hepatic lobe since 2017 without choledocholithiasis. This can be seen in the setting of prior cholecystectomy no space-occupying mass noted. Pancreas: Atrophic  pancreas with a few scattered calcifications within the body of the pancreas possibly related to prior pancreatitis. Spleen: No splenomegaly. Tortuous splenic artery with calcifications near the hilum. No definite splenic artery aneurysm. Adrenals/Urinary Tract: Normal bilateral adrenal glands. Chronic ectasia of the renal collecting systems bilaterally without obstructing calculus. Perinephric fat stranding may be related to urinary tract infection. Stable exophytic cyst off the lower pole of the left kidney measuring 1.7 cm. Status post cystectomy with ileal conduit. Stomach/Bowel: No obstruction or inflammation. Vascular/Lymphatic: Aortoiliac and branch vessel atherosclerosis. No aneurysm. No mass or lymphadenopathy. Reproductive: Hysterectomy.  No adnexal mass. Other: No ascites or pneumoperitoneum. Musculoskeletal: L2-3 degenerative disc disease. Small Tarlov cysts of the sacrum. No acute osseous abnormality. IMPRESSION: 1. Chronic cholecystectomy with chronic stable intra and extrahepatic ductal dilatation which may represent reservoir effect from cholecystectomy. 2. Cystectomy with ileal conduit. Chronic ectasia of both renal collecting systems without definite obstruction. 3. Hepatic granulomas. Stable 17 mm cyst off the lower pole the left kidney. Electronically Signed   By: Ashley Royalty M.D.   On: 07/04/2016 00:54    Procedures Procedures (including critical care time)  Medications Ordered in ED Medications  ondansetron (ZOFRAN) injection 4 mg (4 mg Intravenous Given 07/03/16 2317)  sodium chloride 0.9 % bolus 1,000 mL (0 mLs Intravenous Stopped 07/04/16 0117)  fentaNYL (SUBLIMAZE) injection 50 mcg (50 mcg Intravenous Given 07/03/16 2316)  iopamidol (ISOVUE-300) 61 % injection 15 mL (15 mLs Oral Contrast Given 07/03/16 2315)     Initial Impression / Assessment and Plan / ED Course  I have reviewed the triage vital signs and the nursing notes.  Pertinent labs & imaging results that were  available during my care of the patient were reviewed by me and considered in my medical decision making (see chart for details).     Afebrile with stable vital signs. Abdomen benign however patient is having flank pain. Was diagnosed with urinary tract infection by her primary care provider in the setting of ileal conduit from bladder cancer. Patient also with emesis. Family reports history of small bowel obstruction. Also noted to have transaminitis by PCP.  Labs with mild leukocytosis, mild transaminitis. CT obtained which revealed no evidence of  intra-abdominal inflammatory/infectious processes, small bowel obstruction.   Patient was able to tolerate by mouth challenge. She's got close follow-up with her primary care provider on Monday. Feel she is appropriate for discharge with strict return precautions.  Final Clinical Impressions(s) / ED Diagnoses   Final diagnoses:  Flank pain  Transaminitis  Nausea and vomiting, intractability of vomiting not specified, unspecified vomiting type   Disposition: Discharge  Condition: Good  I have discussed the results, Dx and Tx plan with the patient who expressed understanding and agree(s) with the plan. Discharge instructions discussed at great length. The patient was given strict return precautions who verbalized understanding of the instructions. No further questions at time of discharge.    New Prescriptions   No medications on file    Follow Up: Bernerd Limbo, MD [address] [place] [id number] [phone]   as scheduled for Monday      Fatima Blank, MD 07/04/16 0120

## 2016-07-03 NOTE — ED Triage Notes (Signed)
Pt was seen at PCP yesterday and Dx as having UTI.  Pt given injection of Rocephin and began Cipro PO.  Liver enzymes were found to be elevated yesterday.  Pt was to f/u with PCP Monday but was told to come to ED if she began vomiting.

## 2016-07-04 DIAGNOSIS — R740 Nonspecific elevation of levels of transaminase and lactic acid dehydrogenase [LDH]: Secondary | ICD-10-CM | POA: Diagnosis not present

## 2016-07-05 LAB — URINE CULTURE: Culture: <10000 — AB

## 2016-07-08 ENCOUNTER — Encounter (HOSPITAL_COMMUNITY): Payer: Self-pay | Admitting: Emergency Medicine

## 2016-07-08 ENCOUNTER — Inpatient Hospital Stay (HOSPITAL_COMMUNITY)
Admission: EM | Admit: 2016-07-08 | Discharge: 2016-07-11 | DRG: 391 | Disposition: A | Discharge status: Home / Self Care | Payer: Medicare Other | Attending: Internal Medicine | Admitting: Internal Medicine

## 2016-07-08 ENCOUNTER — Emergency Department (HOSPITAL_COMMUNITY): Payer: Medicare Other

## 2016-07-08 DIAGNOSIS — I129 Hypertensive chronic kidney disease with stage 1 through stage 4 chronic kidney disease, or unspecified chronic kidney disease: Secondary | ICD-10-CM | POA: Diagnosis present

## 2016-07-08 DIAGNOSIS — D649 Anemia, unspecified: Secondary | ICD-10-CM | POA: Diagnosis present

## 2016-07-08 DIAGNOSIS — R110 Nausea: Secondary | ICD-10-CM | POA: Diagnosis present

## 2016-07-08 DIAGNOSIS — R627 Adult failure to thrive: Secondary | ICD-10-CM | POA: Diagnosis present

## 2016-07-08 DIAGNOSIS — E876 Hypokalemia: Secondary | ICD-10-CM | POA: Diagnosis present

## 2016-07-08 DIAGNOSIS — A084 Viral intestinal infection, unspecified: Principal | ICD-10-CM | POA: Diagnosis present

## 2016-07-08 DIAGNOSIS — Z79899 Other long term (current) drug therapy: Secondary | ICD-10-CM | POA: Diagnosis not present

## 2016-07-08 DIAGNOSIS — T368X5A Adverse effect of other systemic antibiotics, initial encounter: Secondary | ICD-10-CM | POA: Diagnosis present

## 2016-07-08 DIAGNOSIS — Z8551 Personal history of malignant neoplasm of bladder: Secondary | ICD-10-CM | POA: Diagnosis not present

## 2016-07-08 DIAGNOSIS — F0390 Unspecified dementia without behavioral disturbance: Secondary | ICD-10-CM | POA: Diagnosis present

## 2016-07-08 DIAGNOSIS — R945 Abnormal results of liver function studies: Secondary | ICD-10-CM

## 2016-07-08 DIAGNOSIS — Z96619 Presence of unspecified artificial shoulder joint: Secondary | ICD-10-CM | POA: Diagnosis present

## 2016-07-08 DIAGNOSIS — N183 Chronic kidney disease, stage 3 (moderate): Secondary | ICD-10-CM | POA: Diagnosis not present

## 2016-07-08 DIAGNOSIS — N179 Acute kidney failure, unspecified: Secondary | ICD-10-CM | POA: Diagnosis present

## 2016-07-08 DIAGNOSIS — Z906 Acquired absence of other parts of urinary tract: Secondary | ICD-10-CM

## 2016-07-08 DIAGNOSIS — N189 Chronic kidney disease, unspecified: Secondary | ICD-10-CM | POA: Diagnosis not present

## 2016-07-08 DIAGNOSIS — N390 Urinary tract infection, site not specified: Secondary | ICD-10-CM | POA: Diagnosis present

## 2016-07-08 DIAGNOSIS — R112 Nausea with vomiting, unspecified: Secondary | ICD-10-CM | POA: Diagnosis not present

## 2016-07-08 DIAGNOSIS — K56609 Unspecified intestinal obstruction, unspecified as to partial versus complete obstruction: Secondary | ICD-10-CM | POA: Insufficient documentation

## 2016-07-08 DIAGNOSIS — R7989 Other specified abnormal findings of blood chemistry: Secondary | ICD-10-CM | POA: Diagnosis not present

## 2016-07-08 DIAGNOSIS — N184 Chronic kidney disease, stage 4 (severe): Secondary | ICD-10-CM | POA: Diagnosis not present

## 2016-07-08 DIAGNOSIS — I10 Essential (primary) hypertension: Secondary | ICD-10-CM | POA: Diagnosis not present

## 2016-07-08 DIAGNOSIS — Z936 Other artificial openings of urinary tract status: Secondary | ICD-10-CM

## 2016-07-08 DIAGNOSIS — G9340 Encephalopathy, unspecified: Secondary | ICD-10-CM | POA: Diagnosis present

## 2016-07-08 DIAGNOSIS — K529 Noninfective gastroenteritis and colitis, unspecified: Secondary | ICD-10-CM | POA: Diagnosis not present

## 2016-07-08 DIAGNOSIS — R1084 Generalized abdominal pain: Secondary | ICD-10-CM

## 2016-07-08 DIAGNOSIS — R197 Diarrhea, unspecified: Secondary | ICD-10-CM | POA: Diagnosis not present

## 2016-07-08 LAB — POC OCCULT BLOOD, ED: FECAL OCCULT BLD: POSITIVE — AB

## 2016-07-08 LAB — URINALYSIS, ROUTINE W REFLEX MICROSCOPIC
Bilirubin Urine: NEGATIVE
Glucose, UA: NEGATIVE mg/dL
Hgb urine dipstick: NEGATIVE
KETONES UR: NEGATIVE mg/dL
LEUKOCYTES UA: NEGATIVE
NITRITE: NEGATIVE
PROTEIN: NEGATIVE mg/dL
Specific Gravity, Urine: 1.003 — ABNORMAL LOW (ref 1.005–1.030)
pH: 6 (ref 5.0–8.0)

## 2016-07-08 LAB — COMPREHENSIVE METABOLIC PANEL
ALK PHOS: 97 U/L (ref 38–126)
ALT: 96 U/L — ABNORMAL HIGH (ref 14–54)
AST: 93 U/L — AB (ref 15–41)
Albumin: 3.3 g/dL — ABNORMAL LOW (ref 3.5–5.0)
Anion gap: 9 (ref 5–15)
BILIRUBIN TOTAL: 0.7 mg/dL (ref 0.3–1.2)
BUN: 9 mg/dL (ref 6–20)
CALCIUM: 8.5 mg/dL — AB (ref 8.9–10.3)
CO2: 21 mmol/L — ABNORMAL LOW (ref 22–32)
CREATININE: 2.22 mg/dL — AB (ref 0.44–1.00)
Chloride: 110 mmol/L (ref 101–111)
GFR, EST AFRICAN AMERICAN: 22 mL/min — AB (ref >60)
GFR, EST NON AFRICAN AMERICAN: 19 mL/min — AB (ref >60)
Glucose, Bld: 81 mg/dL (ref 65–99)
Potassium: 3 mmol/L — ABNORMAL LOW (ref 3.5–5.1)
Sodium: 140 mmol/L (ref 135–145)
TOTAL PROTEIN: 6.8 g/dL (ref 6.5–8.1)

## 2016-07-08 LAB — CBC
HCT: 33.9 % — ABNORMAL LOW (ref 36.0–46.0)
Hemoglobin: 11.6 g/dL — ABNORMAL LOW (ref 12.0–15.0)
MCH: 31 pg (ref 26.0–34.0)
MCHC: 34.2 g/dL (ref 30.0–36.0)
MCV: 90.6 fL (ref 78.0–100.0)
PLATELETS: 302 10*3/uL (ref 150–400)
RBC: 3.74 MIL/uL — AB (ref 3.87–5.11)
RDW: 13.6 % (ref 11.5–15.5)
WBC: 6.4 10*3/uL (ref 4.0–10.5)

## 2016-07-08 LAB — LIPASE, BLOOD: LIPASE: 43 U/L (ref 11–51)

## 2016-07-08 LAB — I-STAT CG4 LACTIC ACID, ED: Lactic Acid, Venous: 1.99 mmol/L (ref 0.5–1.9)

## 2016-07-08 MED ORDER — ONDANSETRON HCL 4 MG/2ML IJ SOLN
4.0000 mg | Freq: Four times a day (QID) | INTRAMUSCULAR | Status: DC | PRN
  Administered 2016-07-09 (×2): 4 mg via INTRAVENOUS
  Filled 2016-07-08 (×2): qty 2

## 2016-07-08 MED ORDER — METRONIDAZOLE IN NACL 5-0.79 MG/ML-% IV SOLN
500.0000 mg | Freq: Three times a day (TID) | INTRAVENOUS | Status: DC
  Administered 2016-07-09 – 2016-07-10 (×5): 500 mg via INTRAVENOUS
  Filled 2016-07-08 (×5): qty 100

## 2016-07-08 MED ORDER — FENTANYL CITRATE (PF) 100 MCG/2ML IJ SOLN
50.0000 ug | Freq: Once | INTRAMUSCULAR | Status: AC
  Administered 2016-07-08: 50 ug via INTRAVENOUS
  Filled 2016-07-08: qty 2

## 2016-07-08 MED ORDER — MECLIZINE HCL 25 MG PO TABS: 12.5000 mg | ORAL_TABLET | Freq: Three times a day (TID) | ORAL | Status: DC | PRN

## 2016-07-08 MED ORDER — DONEPEZIL HCL 5 MG PO TABS
10.0000 mg | ORAL_TABLET | Freq: Every day | ORAL | Status: DC
  Administered 2016-07-09 – 2016-07-10 (×3): 10 mg via ORAL
  Filled 2016-07-08 (×3): qty 2

## 2016-07-08 MED ORDER — SODIUM CHLORIDE 0.9 % IV SOLN
Freq: Once | INTRAVENOUS | Status: AC
  Administered 2016-07-08: 18:00:00 via INTRAVENOUS

## 2016-07-08 MED ORDER — POTASSIUM CHLORIDE IN NACL 20-0.9 MEQ/L-% IV SOLN
INTRAVENOUS | Status: DC
  Administered 2016-07-08: 23:00:00 via INTRAVENOUS
  Filled 2016-07-08 (×2): qty 1000

## 2016-07-08 MED ORDER — MIRTAZAPINE 15 MG PO TABS
15.0000 mg | ORAL_TABLET | Freq: Every day | ORAL | Status: DC
  Administered 2016-07-09 – 2016-07-10 (×3): 15 mg via ORAL
  Filled 2016-07-08 (×3): qty 1

## 2016-07-08 MED ORDER — SACCHAROMYCES BOULARDII 250 MG PO CAPS
250.0000 mg | ORAL_CAPSULE | Freq: Two times a day (BID) | ORAL | Status: DC
  Administered 2016-07-09 – 2016-07-11 (×6): 250 mg via ORAL
  Filled 2016-07-08 (×6): qty 1

## 2016-07-08 MED ORDER — CLONAZEPAM 0.125 MG PO TBDP
0.1250 mg | ORAL_TABLET | Freq: Every day | ORAL | Status: DC
  Administered 2016-07-09 – 2016-07-10 (×3): 0.125 mg via ORAL
  Filled 2016-07-08 (×3): qty 1

## 2016-07-08 MED ORDER — FLUTICASONE PROPIONATE 50 MCG/ACT NA SUSP
2.0000 | Freq: Every day | NASAL | Status: DC
  Administered 2016-07-10: 2 via NASAL
  Filled 2016-07-08: qty 16

## 2016-07-08 MED ORDER — PANTOPRAZOLE SODIUM 40 MG PO TBEC
40.0000 mg | DELAYED_RELEASE_TABLET | Freq: Every day | ORAL | Status: DC
  Administered 2016-07-09 – 2016-07-11 (×3): 40 mg via ORAL
  Filled 2016-07-08 (×3): qty 1

## 2016-07-08 MED ORDER — ONDANSETRON HCL 4 MG/2ML IJ SOLN
4.0000 mg | Freq: Once | INTRAMUSCULAR | Status: AC
  Administered 2016-07-08: 4 mg via INTRAVENOUS
  Filled 2016-07-08: qty 2

## 2016-07-08 MED ORDER — ONDANSETRON HCL 4 MG PO TABS: 4.0000 mg | ORAL_TABLET | Freq: Four times a day (QID) | ORAL | Status: DC | PRN

## 2016-07-08 NOTE — H&P (Addendum)
History and Physical    DEMISHA LAHTI [id number] DOB: 1928-04-25 DOA: 07/08/2016  PCP: [name], MD  Patient coming from: Home.  Chief Complaint: Nausea and diarrhea.  HPI: [name] is a 83 y.o. female with bladder cancer status post cystectomy and ileal conduit has been experiencing diarrhea with nausea vomiting almost last 10 days. Patient had gone to her PCP on 07/02/2016 and was diagnosed with UTI and was placed on antibiotics. At that time patient's LFTs also was found to be elevated. Since patient had persistent symptoms patient had come to the ER the following day and had CT abdomen and pelvis done without contrast which did not show anything acute. UA was showing features consistent with UTI. Patient had followed up with the PCP the following Monday 3 days later and labs done at that time showed persistently elevated LFTs with elevated lipase. Since patient's symptoms was not getting better patient presented to the ER today.  ED Course: Patient's LFTs done today shows improvement from previous but creatinine has worsened. UA does not show any definite infection. X-ray abdomen shows possible small bowel obstruction versus ileus. Patient's metabolic panel shows mild hypokalemia. General surgery was consulted. Patient states her nausea improved but diarrhea had persisted and it had at least 10 episodes yesterday. Stool for occult blood has been also positive.  Review of Systems: As per HPI, rest all negative.   Past Medical History:  Diagnosis Date  . Bladder cancer ([place])   . Dementia   . Hypertension   . Kidney stones   . Osteoporosis   . UTI (lower urinary tract infection)     Past Surgical History:  Procedure Laterality Date  . ABDOMINAL HYSTERECTOMY    . APPENDECTOMY    . bladder removed due to cancer     . CHOLECYSTECTOMY    . REVISION UROSTOMY CUTANEOUS    . TOTAL SHOULDER ARTHROPLASTY       reports that she has never smoked. She has never used  smokeless tobacco. She reports that she does not drink alcohol or use drugs.  Allergies  Allergen Reactions  . Other Shortness Of Breath and Other (See Comments)    Pt was given some kind of breathing treatment (unsure of name).  . Codeine Nausea And Vomiting  . Metoclopramide Other (See Comments)    Reaction:  Makes patient "climb the walls"     Family History  Problem Relation Age of Onset  . Other Mother   . Heart attack Father     Prior to Admission medications   Medication Sig Start Date End Date Taking? Authorizing Provider  cetirizine (ZYRTEC) 10 MG tablet Take 10 mg by mouth at bedtime.    Yes Historical Provider, MD  ciprofloxacin (CIPRO) 500 MG tablet Take 1 tablet by mouth 2 (two) times daily. 07/02/16 07/09/16 Yes Historical Provider, MD  clonazepam (KLONOPIN) 0.125 MG disintegrating tablet Take 0.125 mg by mouth at bedtime.    Yes Historical Provider, MD  Cranberry (AZO-CRANBERRY) 450 MG TABS Take 450 mg by mouth at bedtime.    Yes Historical Provider, MD  donepezil (ARICEPT) 10 MG tablet Take 10 mg by mouth at bedtime.   Yes Historical Provider, MD  fluticasone (FLONASE) 50 MCG/ACT nasal spray Place 2 sprays into both nostrils at bedtime.    Yes Historical Provider, MD  furosemide (LASIX) 20 MG tablet Take 20 mg by mouth every other day.    Yes Historical Provider, MD  meclizine (ANTIVERT) 12.5 MG tablet  Take 12.5 mg by mouth 3 (three) times daily as needed for dizziness.    Yes Historical Provider, MD  mirtazapine (REMERON) 7.5 MG tablet Take 15 mg by mouth at bedtime.   Yes Historical Provider, MD  nitrofurantoin (MACRODANTIN) 50 MG capsule Take 50 mg by mouth at bedtime.   Yes Historical Provider, MD  omeprazole (PRILOSEC) 40 MG capsule Take 40 mg by mouth at bedtime.    Yes Historical Provider, MD  ondansetron (ZOFRAN-ODT) 4 MG disintegrating tablet Take 4 mg by mouth every 8 (eight) hours as needed for nausea or vomiting.    Yes Historical Provider, MD  potassium  chloride SA (K-DUR,KLOR-CON) 20 MEQ tablet Take 20 mEq by mouth every other day.    Yes Historical Provider, MD  saccharomyces boulardii (FLORASTOR) 250 MG capsule Take 1 capsule (250 mg total) by mouth 2 (two) times daily. 08/19/15  Yes Charlynne Cousins, MD    Physical Exam: Vitals:   07/08/16 1935 07/08/16 2000 07/08/16 2037 07/08/16 2059  BP: 125/81 147/58 134/62 (!) 151/53  Pulse: 63 66 64 61  Resp: 16  11 16   Temp: 98.7 F (37.1 C)   98.9 F (37.2 C)  TempSrc: Oral   Oral  SpO2: 98% 98% 96% 97%  Weight:    52.6 kg (115 lb 15.4 oz)  Height:    5\' 1"  (1.549 m)      Constitutional: Moderately built and nourished. Vitals:   07/08/16 1935 07/08/16 2000 07/08/16 2037 07/08/16 2059  BP: 125/81 147/58 134/62 (!) 151/53  Pulse: 63 66 64 61  Resp: 16  11 16   Temp: 98.7 F (37.1 C)   98.9 F (37.2 C)  TempSrc: Oral   Oral  SpO2: 98% 98% 96% 97%  Weight:    52.6 kg (115 lb 15.4 oz)  Height:    5\' 1"  (1.549 m)   Eyes: Anicteric no pallor. ENMT: No discharge from the ears eyes nose and mouth. Neck: No mass felt. No neck rigidity. Respiratory: No rhonchi or crepitations. Cardiovascular: S1 and S2 heard no murmurs appreciated. Abdomen: Ileal conduit seen. Soft nontender bowel sounds present. Musculoskeletal: No edema. No joint effusion. Skin: No rash. Skin appears warm. Neurologic: Alert awake oriented to time place and person. Moves all extremities. Psychiatric: Appears normal. Normal affect.   Labs on Admission: I have personally reviewed following labs and imaging studies  CBC:  Recent Labs Lab 07/03/16 2313 07/08/16 1755  WBC 10.9* 6.4  NEUTROABS 8.2*  --   HGB 12.1 11.6*  HCT 35.4* 33.9*  MCV 91.9 90.6  PLT 247 [id number]   Basic Metabolic Panel:  Recent Labs Lab 07/03/16 2313 07/08/16 1755  NA 138 140  K 3.6 3.0*  CL 110 110  CO2 20* 21*  GLUCOSE 150* 81  BUN 23* 9  CREATININE 1.74* 2.22*  CALCIUM 9.7 8.5*   GFR: Estimated Creatinine Clearance: 13.7  mL/min (by C-G formula based on SCr of 2.22 mg/dL (H)). Liver Function Tests:  Recent Labs Lab 07/03/16 2313 07/08/16 1755  AST 124* 93*  ALT 129* 96*  ALKPHOS 111 97  BILITOT 0.8 0.7  PROT 7.3 6.8  ALBUMIN 3.5 3.3*    Recent Labs Lab 07/03/16 2313 07/08/16 1755  LIPASE 49 43   No results for input(s): AMMONIA in the last 168 hours. Coagulation Profile: No results for input(s): INR, PROTIME in the last 168 hours. Cardiac Enzymes: No results for input(s): CKTOTAL, CKMB, CKMBINDEX, TROPONINI in the last 168 hours. BNP (last 3  results) No results for input(s): PROBNP in the last 8760 hours. HbA1C: No results for input(s): HGBA1C in the last 72 hours. CBG: No results for input(s): GLUCAP in the last 168 hours. Lipid Profile: No results for input(s): CHOL, HDL, LDLCALC, TRIG, CHOLHDL, LDLDIRECT in the last 72 hours. Thyroid Function Tests: No results for input(s): TSH, T4TOTAL, FREET4, T3FREE, THYROIDAB in the last 72 hours. Anemia Panel: No results for input(s): VITAMINB12, FOLATE, FERRITIN, TIBC, IRON, RETICCTPCT in the last 72 hours. Urine analysis:    Component Value Date/Time   COLORURINE YELLOW 07/08/2016 2053   APPEARANCEUR HAZY (A) 07/08/2016 2053   LABSPEC 1.003 (L) 07/08/2016 2053   PHURINE 6.0 07/08/2016 2053   GLUCOSEU NEGATIVE 07/08/2016 2053   HGBUR NEGATIVE 07/08/2016 2053   BILIRUBINUR NEGATIVE 07/08/2016 2053   KETONESUR NEGATIVE 07/08/2016 2053   PROTEINUR NEGATIVE 07/08/2016 2053   UROBILINOGEN 0.2 05/10/2012 1123   NITRITE NEGATIVE 07/08/2016 2053   LEUKOCYTESUR NEGATIVE 07/08/2016 2053   Sepsis Labs: @LABRCNTIP (procalcitonin:4,lacticidven:4) ) Recent Results (from the past 240 hour(s))  Urine culture     Status: Abnormal   Collection Time: 07/03/16 11:13 PM  Result Value Ref Range Status   Specimen Description URINE, RANDOM  Final   Special Requests NONE  Final   Culture (A)  Final    <10,000 COLONIES/mL INSIGNIFICANT GROWTH Performed  at [facility], [address], [place], [place] [address]    Report Status 07/05/2016 FINAL  Final     Radiological Exams on Admission: Dg Abdomen Acute W/chest  Result Date: 07/08/2016 CLINICAL DATA:  abd pain with n/v/d x couple weeks. Patient went to PCP on Friday and diagnosed with UTI. Patient has PMH bladder cancer and had removed. Patient also having dark grainy stools. Hx htn, kidney stones, osteoporosis, cholecystectomy, appendectomy, hysterectomy EXAM: DG ABDOMEN ACUTE W/ 1V CHEST COMPARISON:  04/17/2016 FINDINGS: Stable linear scarring or atelectasis in the left lower lung. Atheromatous aorta. Heart size normal. No pneumothorax or effusion. Right shoulder arthroplasty prosthesis partially visualized. No free air. Scattered fluid levels scattered throughout the abdomen in loops of nondilated bowel. There are no abnormal calcifications.  Cholecystectomy clips. Regional bones unremarkable. IMPRESSION: 1. Fluid levels in nondilated bowel suggesting early ileus versus partial obstruction 2. No free air Electronically Signed   By: Lucrezia Europe M.D.   On: 07/08/2016 18:30      Assessment/Plan Principal Problem:   Nausea vomiting and diarrhea Active Problems:   Acute renal failure (HCC)   Hypertension   Lower urinary tract infectious disease   Elevated LFTs   ARF (acute renal failure) ([place])    1. Nausea vomiting and diarrhea - suspect gastroenteritis. X-rays were showing possible small bowel obstruction versus ileus for which general surgery has been consulted. Appreciate generally consult and recommendations. Patient is continued on empiric antibiotics for now for UTI and added Flagyl. Stool for C. difficile and GI pathogen panel has been ordered. Continue with hydration. Clear liquids. Continue to monitor lactate levels. 2. Elevated LFTs - recent acute hepatitis panel was negative. LFTs tends to be improving. Closely monitor. 3. Acute renal failure on chronic kidney disease stage  IV - at this time appears to be probably prerenal secondary to diarrhea and patient's use of Lasix on top of it. Will hold off Lasix and continue to hydrate and closely monitor intake output. Recent CAT scan done 5 days ago did not show any obstruction. 4. Anemia with stool for occult blood positive - may be related to colitis.  Follow CBC. If there is significant fall in hemoglobin may need GI consult patient is known to [name].   DVT prophylaxis: SCDs. Code Status: Full code.  Family Communication: Patient's daughters at the bedside.  Disposition Plan: Home.  Consults called: General surgery.  Admission status: Inpatient.    Rise Patience MD [facility] Pager [phone].  If 7PM-7AM, please contact night-coverage [email or web address] Password [facility]  07/08/2016, 11:57 PM

## 2016-07-08 NOTE — ED Provider Notes (Signed)
Emergency Department Provider Note   I have reviewed the triage vital signs and the nursing notes.   HISTORY  Chief Complaint Abdominal Pain; Emesis; Diarrhea; dark stools; and elevated lab work from PCP   HPI [name] is a 83 y.o. female with PMH of bladder cancer with ileal conduit, HTN, Dementia, and osteoporosis presents to the emergency department for evaluation of continued diffuse abdominal pain and nausea without vomiting. Patient was evaluated in the emergency department earlier this week. CT scan showed only chronic changes in the setting of elevated liver enzymes. The patient followed with her primary care physician who repeated lab work and showed elevated lipase. Patient has had continued and worsening diffuse abdominal pain with nausea over the past 2 days. No fevers or chills. Family states that she has seemed "depressed" and getting worse despite keeping a liquid diet. They also note black stool that is "grany."    Past Medical History:  Diagnosis Date  . Bladder cancer ([place])   . Dementia   . Hypertension   . Kidney stones   . Osteoporosis   . UTI (lower urinary tract infection)     Patient Active Problem List   Diagnosis Date Noted  . SBO (small bowel obstruction) 07/08/2016  . Nausea vomiting and diarrhea 07/08/2016  . Elevated LFTs 07/08/2016  . ARF (acute renal failure) ([place]) 07/08/2016  . Ileus ([place])   . Abdominal distension   . Uncontrollable vomiting   . Vomiting 09/29/2015  . Sepsis ([place]) 09/29/2015  . Lower urinary tract infectious disease 08/13/2015  . Acute encephalopathy 08/13/2015  . Cutaneous candidiasis 05/13/2012  . Hypokalemia 05/13/2012  . Hypertension 05/11/2012  . Diarrhea 05/11/2012  . Nausea 05/11/2012  . Anorexia 05/11/2012  . Fever 05/10/2012  . Pyelonephritis 05/10/2012  . Acute renal failure ([place]) 05/10/2012    Past Surgical History:  Procedure Laterality Date  . ABDOMINAL HYSTERECTOMY    . APPENDECTOMY    .  bladder removed due to cancer     . CHOLECYSTECTOMY    . REVISION UROSTOMY CUTANEOUS    . TOTAL SHOULDER ARTHROPLASTY        Allergies Other; Codeine; and Metoclopramide  Family History  Problem Relation Age of Onset  . Other Mother   . Heart attack Father     Social History Social History  Substance Use Topics  . Smoking status: Never Smoker  . Smokeless tobacco: Never Used  . Alcohol use No    Review of Systems  Constitutional: No fever/chills Eyes: No visual changes. ENT: No sore throat. Cardiovascular: Denies chest pain. Respiratory: Denies shortness of breath. Gastrointestinal: Positive diffuse abdominal pain. Positive nausea, no vomiting.  No diarrhea.  No constipation. Positive dark stool.  Genitourinary: Negative for dysuria. Musculoskeletal: Negative for back pain. Skin: Negative for rash. Neurological: Negative for headaches, focal weakness or numbness.  10-point ROS otherwise negative.  ____________________________________________   PHYSICAL EXAM:  VITAL SIGNS: ED Triage Vitals [07/08/16 1644]  Enc Vitals Group     BP 146/56     Pulse Rate 77     Resp 19     Temp 98.3 F (36.8 C)     Temp Source Oral     SpO2 100 %   Constitutional: Alert and oriented. Appears uncomfortable.  Eyes: Conjunctivae are normal.  Head: Atraumatic. Nose: No congestion/rhinnorhea. Mouth/Throat: Mucous membranes are moist.  Oropharynx non-erythematous. Neck: No stridor. Cardiovascular: Normal rate, regular rhythm. Good peripheral circulation. Grossly normal heart sounds.   Respiratory: Normal  respiratory effort.  No retractions. Lungs CTAB. Gastrointestinal: Soft with diffuse tenderness. No rebound or guarding. No distention. Normal rectal exam with brown stool.  Musculoskeletal: No lower extremity tenderness nor edema. No gross deformities of extremities. Neurologic:  Normal speech and language. No gross focal neurologic deficits are appreciated.  Skin:  Skin is  warm, dry and intact. No rash noted.  ____________________________________________   LABS (all labs ordered are listed, but only abnormal results are displayed)  Labs Reviewed  COMPREHENSIVE METABOLIC PANEL - Abnormal; Notable for the following:       Result Value   Potassium 3.0 (*)    CO2 21 (*)    Creatinine, Ser 2.22 (*)    Calcium 8.5 (*)    Albumin 3.3 (*)    AST 93 (*)    ALT 96 (*)    GFR calc non Af Amer 19 (*)    GFR calc Af Amer 22 (*)    All other components within normal limits  CBC - Abnormal; Notable for the following:    RBC 3.74 (*)    Hemoglobin 11.6 (*)    HCT 33.9 (*)    All other components within normal limits  URINALYSIS, ROUTINE W REFLEX MICROSCOPIC - Abnormal; Notable for the following:    APPearance HAZY (*)    Specific Gravity, Urine 1.003 (*)    All other components within normal limits  CBC - Abnormal; Notable for the following:    RBC 3.49 (*)    Hemoglobin 10.8 (*)    HCT 31.8 (*)    All other components within normal limits  BASIC METABOLIC PANEL - Abnormal; Notable for the following:    Potassium 3.3 (*)    Chloride 114 (*)    CO2 21 (*)    Creatinine, Ser 2.03 (*)    Calcium 7.6 (*)    GFR calc non Af Amer 21 (*)    GFR calc Af Amer 24 (*)    All other components within normal limits  CBC - Abnormal; Notable for the following:    RBC 3.27 (*)    Hemoglobin 10.1 (*)    HCT 29.9 (*)    All other components within normal limits  HEPATIC FUNCTION PANEL - Abnormal; Notable for the following:    Total Protein 5.6 (*)    Albumin 2.8 (*)    AST 80 (*)    ALT 76 (*)    All other components within normal limits  MAGNESIUM - Abnormal; Notable for the following:    Magnesium 1.0 (*)    All other components within normal limits  POC OCCULT BLOOD, ED - Abnormal; Notable for the following:    Fecal Occult Bld POSITIVE (*)    All other components within normal limits  I-STAT CG4 LACTIC ACID, ED - Abnormal; Notable for the following:     Lactic Acid, Venous 1.99 (*)    All other components within normal limits  C DIFFICILE QUICK SCREEN W PCR REFLEX  GASTROINTESTINAL PANEL BY PCR, STOOL (REPLACES STOOL CULTURE)  URINE CULTURE  LIPASE, BLOOD  LACTIC ACID, PLASMA   ____________________________________________  RADIOLOGY  Dg Abdomen Acute W/chest  Result Date: 07/08/2016 CLINICAL DATA:  abd pain with n/v/d x couple weeks. Patient went to PCP on Friday and diagnosed with UTI. Patient has PMH bladder cancer and had removed. Patient also having dark grainy stools. Hx htn, kidney stones, osteoporosis, cholecystectomy, appendectomy, hysterectomy EXAM: DG ABDOMEN ACUTE W/ 1V CHEST COMPARISON:  04/17/2016 FINDINGS: Stable linear  scarring or atelectasis in the left lower lung. Atheromatous aorta. Heart size normal. No pneumothorax or effusion. Right shoulder arthroplasty prosthesis partially visualized. No free air. Scattered fluid levels scattered throughout the abdomen in loops of nondilated bowel. There are no abnormal calcifications.  Cholecystectomy clips. Regional bones unremarkable. IMPRESSION: 1. Fluid levels in nondilated bowel suggesting early ileus versus partial obstruction 2. No free air Electronically Signed   By: Lucrezia Europe M.D.   On: 07/08/2016 18:30    ____________________________________________   PROCEDURES  Procedure(s) performed:   Procedures  None ____________________________________________   INITIAL IMPRESSION / ASSESSMENT AND PLAN / ED COURSE  Pertinent labs & imaging results that were available during my care of the patient were reviewed by me and considered in my medical decision making (see chart for details).  Patient presents to the emergency department for evaluation of diffuse abdominal pain and nausea in the setting of elevated liver enzymes and now reportedly elevated lipase. Patient is status post cholecystectomy and has an existing ileal conduit after bladder cancer. Was recently diagnosed  and treated with urinary tract infection. CT scan from her last ED visit showed chronic changes with no acute findings. Plan for repeat lab work and plain film of the abdomen. Would consider ultrasound of the right upper quadrant to better evaluate the ductal dilation seen on the CT in the setting of pain, nausea, elevated lipase and liver enzymes. Patient may also benefit ultimately from admission for pain control and MRCP.   Discussed patient's case with hospitalist, Dr. Hal Hope. Patient and family (if present) updated with plan. Care transferred to hospitalist service.  I reviewed all nursing notes, vitals, pertinent old records, EKGs, labs, imaging (as available).  08:10 PM Spoke with Dr. Redmond Pulling with general surgery who will consult on the patient as an inpatient.   ____________________________________________  FINAL CLINICAL IMPRESSION(S) / ED DIAGNOSES  Final diagnoses:  Generalized abdominal pain  Non-intractable vomiting with nausea, unspecified vomiting type     MEDICATIONS GIVEN DURING THIS VISIT:  Medications  0.9 % NaCl with KCl 20 mEq/ L  infusion ( Intravenous New Bag/Given 07/08/16 2301)  mirtazapine (REMERON) tablet 15 mg (15 mg Oral Given 07/09/16 0019)  clonazepam (KLONOPIN) disintegrating tablet 0.125 mg (0.125 mg Oral Given 07/09/16 0126)  donepezil (ARICEPT) tablet 10 mg (10 mg Oral Given 07/09/16 0020)  fluticasone (FLONASE) 50 MCG/ACT nasal spray 2 spray (2 sprays Each Nare Not Given 07/09/16 0127)  meclizine (ANTIVERT) tablet 12.5 mg (not administered)  pantoprazole (PROTONIX) EC tablet 40 mg (40 mg Oral Given 07/09/16 0917)  saccharomyces boulardii (FLORASTOR) capsule 250 mg (250 mg Oral Given 07/09/16 0917)  metroNIDAZOLE (FLAGYL) IVPB 500 mg (500 mg Intravenous Given 07/09/16 0902)  ondansetron (ZOFRAN) tablet 4 mg (not administered)    Or  ondansetron (ZOFRAN) injection 4 mg (not administered)  ciprofloxacin (CIPRO) IVPB 400 mg (400 mg Intravenous Given  07/09/16 0127)  fentaNYL (SUBLIMAZE) injection 50 mcg (50 mcg Intravenous Given 07/08/16 1802)  ondansetron (ZOFRAN) injection 4 mg (4 mg Intravenous Given 07/08/16 1759)  0.9 %  sodium chloride infusion ( Intravenous Stopped 07/08/16 2100)  potassium chloride SA (K-DUR,KLOR-CON) CR tablet 40 mEq (40 mEq Oral Given 07/09/16 0903)  magnesium sulfate IVPB 2 g 50 mL (2 g Intravenous Given 07/09/16 0902)     NEW OUTPATIENT MEDICATIONS STARTED DURING THIS VISIT:  None   Note:  This document was prepared using Dragon voice recognition software and may include unintentional dictation errors.  Nanda Quinton, MD Emergency Medicine  Margette Fast, MD 07/09/16 1057

## 2016-07-08 NOTE — ED Notes (Signed)
Pt's daughters reports pt was seen Saturday in the ED for same, worsening.  Reports n/v/d and upper mid abd pain.  Pt rates pain 5/10.  Pt is A&OX4.  Daughter reports pt was dx with UTI last week and was started on cipro.  She reports n/v/d and abd pain started a week ago.

## 2016-07-08 NOTE — ED Notes (Signed)
Family at bedside. 

## 2016-07-08 NOTE — ED Notes (Signed)
Patient transported to X-ray 

## 2016-07-08 NOTE — ED Triage Notes (Signed)
Pt c/o abd pain with n/v/d x couple weeks. Patient went to PCP on Friday and diagnosed with UTI. Patient has PMH bladder cancer and had removed. Patient had blood work done Monday and was called by PCP today that had elevated liver and pancrease enzymes and told to come to ED.  Patient also having dark grainy stools.

## 2016-07-08 NOTE — Consult Note (Signed)
Reason for Consult: Nausea, vomiting, abdominal pain, diarrhea Referring Physician: Dr Lilia Pro [name] is an 83 y.o. female.  HPI: 83 year old female with mild dementia, hypertension, history of bladder cancer status post ileal conduit was brought in by her daughters for ongoing nausea and diarrhea. The patient has a history of urinary tract infections. About a week ago she started feeling ill. She went to see the doctor and was diagnosed with a urinary tract infection. Some of her transaminases were mildly elevated in the 130s. She also had more confusion per the daughters. She was started on antibiotics last Friday evening. She has irregular bowel habits normally. Some days to loose some days are more formed. However over the past week or so she has had more loose stools. Her bowel movement consisting change prior to initiation of antibiotics. She has been on a clear liquid diet since Monday. She has been drinking liquids and keeping them down. She has just been complaining of ongoing nonspecific abdominal pain with diarrhea and nausea. She was brought to the emergency room because her symptoms were not improving. Her hepatitis panel from the other day was negative. Her white count on the 13th was 13,000 and is now normal. However her creatinine has gone up. Baseline appears to be around 1.7 it is now 2.2. She had a CT scan when she came to the emergency room over the weekend which was unremarkable for acute changes. She had plain x-rays this evening showed some air-fluid levels and colonic gas.  Past Medical History:  Diagnosis Date  . Bladder cancer ([place])   . Dementia   . Hypertension   . Kidney stones   . Osteoporosis   . UTI (lower urinary tract infection)     Past Surgical History:  Procedure Laterality Date  . ABDOMINAL HYSTERECTOMY    . APPENDECTOMY    . bladder removed due to cancer     . CHOLECYSTECTOMY    . REVISION UROSTOMY CUTANEOUS    . TOTAL SHOULDER ARTHROPLASTY        Family History  Problem Relation Age of Onset  . Other Mother   . Heart attack Father     Social History:  reports that she has never smoked. She has never used smokeless tobacco. She reports that she does not drink alcohol or use drugs.  Allergies:  Allergies  Allergen Reactions  . Other Shortness Of Breath and Other (See Comments)    Pt was given some kind of breathing treatment (unsure of name).  . Codeine Nausea And Vomiting  . Metoclopramide Other (See Comments)    Reaction:  Makes patient "climb the walls"     Medications: I have reviewed the patient's current medications.  Results for orders placed or performed during the hospital encounter of 07/08/16 (from the past 48 hour(s))  Lipase, blood     Status: None   Collection Time: 07/08/16  5:55 PM  Result Value Ref Range   Lipase 43 11 - 51 U/L  Comprehensive metabolic panel     Status: Abnormal   Collection Time: 07/08/16  5:55 PM  Result Value Ref Range   Sodium 140 135 - 145 mmol/L   Potassium 3.0 (L) 3.5 - 5.1 mmol/L   Chloride 110 101 - 111 mmol/L   CO2 21 (L) 22 - 32 mmol/L   Glucose, Bld 81 65 - 99 mg/dL   BUN 9 6 - 20 mg/dL   Creatinine, Ser 2.22 (H) 0.44 - 1.00 mg/dL  Calcium 8.5 (L) 8.9 - 10.3 mg/dL   Total Protein 6.8 6.5 - 8.1 g/dL   Albumin 3.3 (L) 3.5 - 5.0 g/dL   AST 93 (H) 15 - 41 U/L   ALT 96 (H) 14 - 54 U/L   Alkaline Phosphatase 97 38 - 126 U/L   Total Bilirubin 0.7 0.3 - 1.2 mg/dL   GFR calc non Af Amer 19 (L) >60 mL/min   GFR calc Af Amer 22 (L) >60 mL/min    Comment: (NOTE) The eGFR has been calculated using the CKD EPI equation. This calculation has not been validated in all clinical situations. eGFR's persistently <60 mL/min signify possible Chronic Kidney Disease.    Anion gap 9 5 - 15  CBC     Status: Abnormal   Collection Time: 07/08/16  5:55 PM  Result Value Ref Range   WBC 6.4 4.0 - 10.5 K/uL   RBC 3.74 (L) 3.87 - 5.11 MIL/uL   Hemoglobin 11.6 (L) 12.0 - 15.0 g/dL    HCT 33.9 (L) 36.0 - 46.0 %   MCV 90.6 78.0 - 100.0 fL   MCH 31.0 26.0 - 34.0 pg   MCHC 34.2 30.0 - 36.0 g/dL   RDW 13.6 11.5 - 15.5 %   Platelets 302 150 - 400 K/uL  I-Stat CG4 Lactic Acid, ED     Status: Abnormal   Collection Time: 07/08/16  6:03 PM  Result Value Ref Range   Lactic Acid, Venous 1.99 (HH) 0.5 - 1.9 mmol/L  POC occult blood, ED     Status: Abnormal   Collection Time: 07/08/16  6:18 PM  Result Value Ref Range   Fecal Occult Bld POSITIVE (A) NEGATIVE  Urinalysis, Routine w reflex microscopic     Status: Abnormal   Collection Time: 07/08/16  8:53 PM  Result Value Ref Range   Color, Urine YELLOW YELLOW   APPearance HAZY (A) CLEAR   Specific Gravity, Urine 1.003 (L) 1.005 - 1.030   pH 6.0 5.0 - 8.0   Glucose, UA NEGATIVE NEGATIVE mg/dL   Hgb urine dipstick NEGATIVE NEGATIVE   Bilirubin Urine NEGATIVE NEGATIVE   Ketones, ur NEGATIVE NEGATIVE mg/dL   Protein, ur NEGATIVE NEGATIVE mg/dL   Nitrite NEGATIVE NEGATIVE   Leukocytes, UA NEGATIVE NEGATIVE    Dg Abdomen Acute W/chest  Result Date: 07/08/2016 CLINICAL DATA:  abd pain with n/v/d x couple weeks. Patient went to PCP on Friday and diagnosed with UTI. Patient has PMH bladder cancer and had removed. Patient also having dark grainy stools. Hx htn, kidney stones, osteoporosis, cholecystectomy, appendectomy, hysterectomy EXAM: DG ABDOMEN ACUTE W/ 1V CHEST COMPARISON:  04/17/2016 FINDINGS: Stable linear scarring or atelectasis in the left lower lung. Atheromatous aorta. Heart size normal. No pneumothorax or effusion. Right shoulder arthroplasty prosthesis partially visualized. No free air. Scattered fluid levels scattered throughout the abdomen in loops of nondilated bowel. There are no abnormal calcifications.  Cholecystectomy clips. Regional bones unremarkable. IMPRESSION: 1. Fluid levels in nondilated bowel suggesting early ileus versus partial obstruction 2. No free air Electronically Signed   By: Lucrezia Europe M.D.   On:  07/08/2016 18:30    Review of Systems  Constitutional: Positive for chills. Negative for weight loss.  HENT: Negative for nosebleeds.   Eyes: Negative for blurred vision.  Respiratory: Negative for shortness of breath.   Cardiovascular: Negative for chest pain, palpitations, orthopnea and PND.       Denies DOE  Gastrointestinal: Positive for abdominal pain, diarrhea and nausea.  Genitourinary:  Positive for dysuria. Negative for hematuria.  Musculoskeletal: Positive for back pain.  Skin: Negative for itching and rash.  Neurological: Positive for weakness. Negative for dizziness, focal weakness, seizures, loss of consciousness and headaches.       Denies TIAs, amaurosis fugax  Endo/Heme/Allergies: Does not bruise/bleed easily.  Psychiatric/Behavioral: Positive for memory loss. The patient is not nervous/anxious.    Blood pressure (!) 151/53, pulse 61, temperature 98.9 F (37.2 C), temperature source Oral, resp. rate 16, height '5\' 1"'  (1.549 m), weight 52.6 kg (115 lb 15.4 oz), SpO2 97 %. Physical Exam  Vitals reviewed. Constitutional: She is oriented to person, place, and time. She appears well-developed and well-nourished. No distress.  Elderly female resting comfortably. Non toxic  HENT:  Head: Normocephalic and atraumatic.  Right Ear: External ear normal.  Left Ear: External ear normal.  Eyes: Conjunctivae are normal. No scleral icterus.  Neck: Normal range of motion. Neck supple. No tracheal deviation present. No thyromegaly present.  Cardiovascular: Normal rate and normal heart sounds.   Respiratory: Effort normal and breath sounds normal. No stridor. No respiratory distress. She has no wheezes.  GI: Soft. She exhibits no distension. There is no rigidity, no rebound and no guarding.    Tenderness to palpation throughout. Ileal conduit with a little urine in bag. No peritonitis  Musculoskeletal: She exhibits no edema or tenderness.  Lymphadenopathy:    She has no cervical  adenopathy.  Neurological: She is alert and oriented to person, place, and time. She exhibits normal muscle tone.  Skin: Skin is warm and dry. No rash noted. She is not diaphoretic. No erythema. No pallor.  Psychiatric: She has a normal mood and affect. Her behavior is normal. Judgment and thought content normal.    Assessment/Plan: 83 year old female with Nausea, abdominal pain, and diarrhea Urinary tract infection Acute kidney injury Hypokalemia Resolving transaminitis. Memory loss  I believe her urinary tract infection was the inciting event for the abdominal process. I do not believe she has an obstruction since she has had more than 10 loose stools today. If anything she may have a mild ileus.  I recommend water and ice chips as tolerated Gentle IV fluid resuscitation I would consider checking C. difficile panel Daily electrolytes We will follow  Greer Pickerel M 07/08/2016, 9:57 PM

## 2016-07-08 NOTE — ED Notes (Signed)
Will get urine sample after urostomy bag change.

## 2016-07-09 DIAGNOSIS — R7989 Other specified abnormal findings of blood chemistry: Secondary | ICD-10-CM

## 2016-07-09 DIAGNOSIS — R112 Nausea with vomiting, unspecified: Secondary | ICD-10-CM

## 2016-07-09 DIAGNOSIS — I10 Essential (primary) hypertension: Secondary | ICD-10-CM

## 2016-07-09 DIAGNOSIS — N184 Chronic kidney disease, stage 4 (severe): Secondary | ICD-10-CM

## 2016-07-09 DIAGNOSIS — R197 Diarrhea, unspecified: Secondary | ICD-10-CM

## 2016-07-09 DIAGNOSIS — K529 Noninfective gastroenteritis and colitis, unspecified: Secondary | ICD-10-CM

## 2016-07-09 DIAGNOSIS — N179 Acute kidney failure, unspecified: Secondary | ICD-10-CM

## 2016-07-09 LAB — MAGNESIUM: Magnesium: 1 mg/dL — ABNORMAL LOW (ref 1.7–2.4)

## 2016-07-09 LAB — CBC
HCT: 31.8 % — ABNORMAL LOW (ref 36.0–46.0)
HEMATOCRIT: 29.9 % — AB (ref 36.0–46.0)
Hemoglobin: 10.1 g/dL — ABNORMAL LOW (ref 12.0–15.0)
Hemoglobin: 10.8 g/dL — ABNORMAL LOW (ref 12.0–15.0)
MCH: 30.9 pg (ref 26.0–34.0)
MCH: 30.9 pg (ref 26.0–34.0)
MCHC: 33.8 g/dL (ref 30.0–36.0)
MCHC: 34 g/dL (ref 30.0–36.0)
MCV: 91.1 fL (ref 78.0–100.0)
MCV: 91.4 fL (ref 78.0–100.0)
PLATELETS: 266 10*3/uL (ref 150–400)
PLATELETS: 269 10*3/uL (ref 150–400)
RBC: 3.27 MIL/uL — AB (ref 3.87–5.11)
RBC: 3.49 MIL/uL — ABNORMAL LOW (ref 3.87–5.11)
RDW: 13.8 % (ref 11.5–15.5)
RDW: 13.8 % (ref 11.5–15.5)
WBC: 5.6 10*3/uL (ref 4.0–10.5)
WBC: 6.1 10*3/uL (ref 4.0–10.5)

## 2016-07-09 LAB — LACTIC ACID, PLASMA: Lactic Acid, Venous: 0.8 mmol/L (ref 0.5–1.9)

## 2016-07-09 LAB — BASIC METABOLIC PANEL
Anion gap: 7 (ref 5–15)
BUN: 9 mg/dL (ref 6–20)
CHLORIDE: 114 mmol/L — AB (ref 101–111)
CO2: 21 mmol/L — AB (ref 22–32)
CREATININE: 2.03 mg/dL — AB (ref 0.44–1.00)
Calcium: 7.6 mg/dL — ABNORMAL LOW (ref 8.9–10.3)
GFR calc Af Amer: 24 mL/min — ABNORMAL LOW (ref >60)
GFR calc non Af Amer: 21 mL/min — ABNORMAL LOW (ref >60)
Glucose, Bld: 77 mg/dL (ref 65–99)
POTASSIUM: 3.3 mmol/L — AB (ref 3.5–5.1)
Sodium: 142 mmol/L (ref 135–145)

## 2016-07-09 LAB — HEPATIC FUNCTION PANEL
ALK PHOS: 81 U/L (ref 38–126)
ALT: 76 U/L — AB (ref 14–54)
AST: 80 U/L — ABNORMAL HIGH (ref 15–41)
Albumin: 2.8 g/dL — ABNORMAL LOW (ref 3.5–5.0)
BILIRUBIN DIRECT: 0.1 mg/dL (ref 0.1–0.5)
BILIRUBIN INDIRECT: 0.7 mg/dL (ref 0.3–0.9)
BILIRUBIN TOTAL: 0.8 mg/dL (ref 0.3–1.2)
Total Protein: 5.6 g/dL — ABNORMAL LOW (ref 6.5–8.1)

## 2016-07-09 LAB — C DIFFICILE QUICK SCREEN W PCR REFLEX
C DIFFICLE (CDIFF) ANTIGEN: NEGATIVE
C Diff interpretation: NOT DETECTED
C Diff toxin: NEGATIVE

## 2016-07-09 MED ORDER — POTASSIUM CHLORIDE CRYS ER 20 MEQ PO TBCR
40.0000 meq | EXTENDED_RELEASE_TABLET | Freq: Once | ORAL | Status: AC
  Administered 2016-07-09: 40 meq via ORAL
  Filled 2016-07-09: qty 2

## 2016-07-09 MED ORDER — POTASSIUM CHLORIDE IN NACL 20-0.9 MEQ/L-% IV SOLN
INTRAVENOUS | Status: DC
  Administered 2016-07-09 – 2016-07-10 (×2): via INTRAVENOUS
  Filled 2016-07-09 (×2): qty 1000

## 2016-07-09 MED ORDER — CIPROFLOXACIN IN D5W 400 MG/200ML IV SOLN
400.0000 mg | Freq: Every day | INTRAVENOUS | Status: DC
  Administered 2016-07-09 (×2): 400 mg via INTRAVENOUS
  Filled 2016-07-09 (×2): qty 200

## 2016-07-09 MED ORDER — ACETAMINOPHEN 325 MG PO TABS: 650.0000 mg | ORAL_TABLET | Freq: Four times a day (QID) | ORAL | Status: DC | PRN

## 2016-07-09 MED ORDER — CRANBERRY 450 MG PO TABS: 450.0000 mg | ORAL_TABLET | Freq: Every day | ORAL | Status: DC

## 2016-07-09 MED ORDER — MAGNESIUM SULFATE 2 GM/50ML IV SOLN
2.0000 g | Freq: Once | INTRAVENOUS | Status: AC
  Administered 2016-07-09: 2 g via INTRAVENOUS
  Filled 2016-07-09: qty 50

## 2016-07-09 NOTE — Progress Notes (Signed)
Patient had lab orders for CBC Q4 and at 5am. Also had lactic acid lab ordered Q3 hours. Kristen from the lab called regarding patients labs, Dr. Hal Hope was paged and responded, stating to cancel the lactic acid lab and that all other labs could be drawn at 5am. Kristen in lab notified.

## 2016-07-09 NOTE — Progress Notes (Signed)
Patient ID: [name], female   DOB: [birth year], 83 y.o.   MRN: [id number]  [facility] Surgery Progress Note     Subjective: States that she has less abdominal pain today. She is not passing flatus but feels her "belly rumbling." Had a BM yesterday, none today. She is tolerating clears and denies any n/v. She has been advanced to full liquids.  Objective: Vital signs in last 24 hours: Temp:  [98.3 F (36.8 C)-98.9 F (37.2 C)] 98.6 F (37 C) (02/23 0505) Pulse Rate:  [61-77] 70 (02/23 0505) Resp:  [11-19] 16 (02/23 0505) BP: (125-151)/(53-81) 128/58 (02/23 0505) SpO2:  [94 %-100 %] 94 % (02/23 0505) Weight:  [115 lb 15.4 oz (52.6 kg)-119 lb 14.9 oz (54.4 kg)] 119 lb 14.9 oz (54.4 kg) (02/23 0505) Last BM Date: 07/08/16  Intake/Output from previous day: 02/22 0701 - 02/23 0700 In: 883.8 [P.O.:60; I.V.:523.8; IV Piggyback:300] Out: 175 [Urine:175] Intake/Output this shift: No intake/output data recorded.  PE: Gen:  Alert, NAD, pleasant Pulm:  Effort normal Abd: Soft, mild distension, +BS, no hernia, no guarding or rebound. Tender to palpation globally, most significantly in the LUQ. RLQ urostomy in place Ext:  No erythema, edema, or tenderness   Lab Results:   Recent Labs  07/09/16 0012 07/09/16 0514  WBC 5.6 6.1  HGB 10.8* 10.1*  HCT 31.8* 29.9*  PLT 269 266   BMET  Recent Labs  07/08/16 1755 07/09/16 0514  NA 140 142  K 3.0* 3.3*  CL 110 114*  CO2 21* 21*  GLUCOSE 81 77  BUN 9 9  CREATININE 2.22* 2.03*  CALCIUM 8.5* 7.6*   PT/INR No results for input(s): LABPROT, INR in the last 72 hours. CMP     Component Value Date/Time   NA 142 07/09/2016 0514   K 3.3 (L) 07/09/2016 0514   CL 114 (H) 07/09/2016 0514   CO2 21 (L) 07/09/2016 0514   GLUCOSE 77 07/09/2016 0514   BUN 9 07/09/2016 0514   CREATININE 2.03 (H) 07/09/2016 0514   CALCIUM 7.6 (L) 07/09/2016 0514   PROT 5.6 (L) 07/09/2016 0514   ALBUMIN 2.8 (L) 07/09/2016 0514   AST 80 (H)  07/09/2016 0514   ALT 76 (H) 07/09/2016 0514   ALKPHOS 81 07/09/2016 0514   BILITOT 0.8 07/09/2016 0514   GFRNONAA 21 (L) 07/09/2016 0514   GFRAA 24 (L) 07/09/2016 0514   Lipase     Component Value Date/Time   LIPASE 43 07/08/2016 1755       Studies/Results: Dg Abdomen Acute W/chest  Result Date: 07/08/2016 CLINICAL DATA:  abd pain with n/v/d x couple weeks. Patient went to PCP on Friday and diagnosed with UTI. Patient has PMH bladder cancer and had removed. Patient also having dark grainy stools. Hx htn, kidney stones, osteoporosis, cholecystectomy, appendectomy, hysterectomy EXAM: DG ABDOMEN ACUTE W/ 1V CHEST COMPARISON:  04/17/2016 FINDINGS: Stable linear scarring or atelectasis in the left lower lung. Atheromatous aorta. Heart size normal. No pneumothorax or effusion. Right shoulder arthroplasty prosthesis partially visualized. No free air. Scattered fluid levels scattered throughout the abdomen in loops of nondilated bowel. There are no abnormal calcifications.  Cholecystectomy clips. Regional bones unremarkable. IMPRESSION: 1. Fluid levels in nondilated bowel suggesting early ileus versus partial obstruction 2. No free air Electronically Signed   By: Lucrezia Europe M.D.   On: 07/08/2016 18:30    Anti-infectives: Anti-infectives    Start     Dose/Rate Route Frequency Ordered Stop   07/09/16 0015  ciprofloxacin (CIPRO) IVPB 400 mg     400 mg 200 mL/hr over 60 Minutes Intravenous Daily at bedtime 07/09/16 0005     07/09/16 0000  metroNIDAZOLE (FLAGYL) IVPB 500 mg     500 mg 100 mL/hr over 60 Minutes Intravenous Every 8 hours 07/08/16 2356         Assessment/Plan Nausea, abdominal pain, and diarrhea  - possible mild ileus vs partial SBO  - BM yesterday, and +BS on exam  Urinary tract infection - culture pending, on cipro Acute kidney injury - slightly improved Cr 2.03, continue IVF Hypokalemia - slightly improved, 3.3 today, continue replacement Resolving  transaminitis Memory loss  ID - cipro/flagyl 2/23>>.  FEN - full liquids advance as tolerated. On probiotic. VTE - SCDs  Plan - Agree with gradually advancing diet as tolerated.   PT consult to maximize mobility.   C diff pending. This was negative.  Will continue to follow.   LOS: 1 day   Jerrye Beavers , [facility] Surgery 07/09/2016, 12:05 PM Pager: [phone] Consults: [phone]  Agree with above. Two daughters are in the room with the patient.    She is behaving more like a gastroenteritis than a surgical patient. She's had multiple loose stools.  Will follow.  Alphonsa Overall, MD, [facility] Surgery Pager: [phone] Office phone:  [phone]

## 2016-07-09 NOTE — Progress Notes (Signed)
PHARMACIST - PHYSICIAN ORDER COMMUNICATION  CONCERNING: P&T Medication Policy on Herbal Medications  DESCRIPTION:  This patient's order for:  Cranberry tab  has been noted.  This product(s) is classified as an "herbal" or natural product. Due to a lack of definitive safety studies or FDA approval, nonstandard manufacturing practices, plus the potential risk of unknown drug-drug interactions while on inpatient medications, the Pharmacy and Therapeutics Committee does not permit the use of "herbal" or natural products of this type within [facility].   ACTION TAKEN: The pharmacy department is unable to verify this order at this time and your patient has been informed of this safety policy. Please reevaluate patient's clinical condition at discharge and address if the herbal or natural product(s) should be resumed at that time.  Doreene Eland, PharmD, BCPS.   Pager: [phone] 07/09/2016 11:41 AM

## 2016-07-09 NOTE — Progress Notes (Addendum)
Pt became nauseated and having abd pain after eatting full liquid diet, [facility] PA notified, diet changed to clear liquid. Will cont to monitor. SRP RN

## 2016-07-09 NOTE — Progress Notes (Signed)
Pharmacy Antibiotic Note  [name] is a 83 y.o. female c/o vomiting, diarrhea and abdominal pain admitted on 07/08/2016 with UTI.  Pharmacy has been consulted for Cipro dosing.  Plan: Cipro 400 mg IV q24h (CrCl~14 ml/min) F/u scr  Height: 5\' 1"  (154.9 cm) Weight: 115 lb 15.4 oz (52.6 kg) IBW/kg (Calculated) : 47.8  Temp (24hrs), Avg:98.6 F (37 C), Min:98.3 F (36.8 C), Max:98.9 F (37.2 C)   Recent Labs Lab 07/03/16 2313 07/03/16 2319 07/08/16 1755 07/08/16 1803  WBC 10.9*  --  6.4  --   CREATININE 1.74*  --  2.22*  --   LATICACIDVEN  --  1.91*  --  1.99*    Estimated Creatinine Clearance: 13.7 mL/min (by C-G formula based on SCr of 2.22 mg/dL (H)).    Allergies  Allergen Reactions  . Other Shortness Of Breath and Other (See Comments)    Pt was given some kind of breathing treatment (unsure of name).  . Codeine Nausea And Vomiting  . Metoclopramide Other (See Comments)    Reaction:  Makes patient "climb the walls"     Antimicrobials this admission: 2/23 cipro >>  2/23 flagyl >>   Dose adjustments this admission:   Microbiology results:  BCx:   UCx:    Sputum:    MRSA PCR:   Thank you for allowing pharmacy to be a part of this patient's care.  Dorrene German 07/09/2016 12:05 AM

## 2016-07-09 NOTE — Progress Notes (Signed)
PROGRESS NOTE  [name]  [id number] DOB: [birth year]  DOA: 07/08/2016 PCP: [name], MD   Brief Narrative:  83 year old female, lives with her daughter who provides 24/7 supervision and care, ambulates with the help of a walker, PMH of bladder cancer status post cystectomy and ileal conduit (Dr. [name], Urology), dementia, HTN, seen by PCP on 07/02/16 for confusion, decreased oral intake, high colored urine and initiated treatment for UTI with Cipro. Next day she developed nausea, vomiting and abdominal pain and was seen in ED 07/03/16 >mild leukocytosis and transaminitis, CT abdomen without acute findings, able to tolerate oral challenge and was discharged from ED to follow-up with PCP. Seen by PCP 3 days later and LFT showed persistent elevation and elevated lipase. Since the ED visit, she continued to have nausea, nonbloody emesis, abdominal pain, diarrhea and altered mental status. In the ED, LFTs better, UA better, abdominal x-ray suggestive of ileus, hypokalemia. Admitted for further management. General surgery consulted.   Assessment & Plan:   Principal Problem:   Nausea vomiting and diarrhea Active Problems:   Acute renal failure (HCC)   Hypertension   Lower urinary tract infectious disease   Elevated LFTs   ARF (acute renal failure) (HCC)   Nausea, vomiting and diarrhea - DD: Acute infectious gastroenteritis, secondary to UTI, related to medications (Cipro). General surgery consultation appreciated and suspect mild ileus. No vomiting or BM since 2/22 afternoon. Tolerating clear liquids. Advance diet as tolerated. If no further symptoms, consider stopping all antibiotics for this indication.  Recent UTI - Urine culture 07/03/16: Insignificant growth and had been on antibiotics for less than 24 hours. Repeat urine microscopy 2/22 not suggestive of UTI. Currently on Cipro. If urine culture negative, DC antibiotics.  Elevated LFTs - Mild. Improving. Acute hepatitis panel  negative. Unclear etiology. Patient is status post cholecystectomy with chronic dilatation of hepatobiliary tract.   Acute on stage III chronic kidney disease - Baseline creatinine? 1.6-1.8. Presented with creatinine of 2.2 to which is increased to 2.03. Likely prerenal secondary to GI losses, poor oral intake and Lasix. Continue to hold Lasix. IV fluids. Did not receive IV contrast during CT on 07/04/16.   Anemia  - may be dilutional. Follow CBCs. FOBT +.  Acute encephalopathy/dementia - Mental status changes secondary to acute illness complicating underlying moderate dementia. Improved.  Hypokalemia and hypomagnesemia - Replace and follow.  Essential hypertension Controlled.   DVT prophylaxis: SCD's Code Status: Full Family Communication: Discussed with 2 daughter's at bedside, updated care and answered questions.  Disposition Plan: DC home when medically stable possibly in 2-3 days   Consultants:   CCS  Procedures:   None  Antimicrobials:   IV Cipro  IV Flagyl    Subjective: Feels better. Denies pain. Nausea present. Tolerated clear liquids. As per daughters, confusion improved, no vomiting or BM since yesterday afternoon. Urine color has normalized.   Objective:  Vitals:   07/08/16 2000 07/08/16 2037 07/08/16 2059 07/09/16 0505  BP: 147/58 134/62 (!) 151/53 (!) 128/58  Pulse: 66 64 61 70  Resp:  11 16 16   Temp:   98.9 F (37.2 C) 98.6 F (37 C)  TempSrc:   Oral Oral  SpO2: 98% 96% 97% 94%  Weight:   52.6 kg (115 lb 15.4 oz) 54.4 kg (119 lb 14.9 oz)  Height:   5\' 1"  (1.549 m)     Intake/Output Summary (Last 24 hours) at 07/09/16 1112 Last data filed at 07/09/16 0600  Gross per 24 hour  Intake           883.75 ml  Output              175 ml  Net           708.75 ml   Filed Weights   07/08/16 2059 07/09/16 0505  Weight: 52.6 kg (115 lb 15.4 oz) 54.4 kg (119 lb 14.9 oz)    Examination:  General exam: Pleasant elderly female lying comfortably  supine in bed. Does not look septic or toxic.  Respiratory system: Clear to auscultation. Respiratory effort normal. Cardiovascular system: S1 & S2 heard, RRR. No JVD, murmurs, rubs, gallops or clicks. No pedal edema. Gastrointestinal system: Abdomen is nondistended, soft and nontender. No organomegaly or masses felt. Normal bowel sounds heard. Ileal conduit draining clear straw-colored urine.  Central nervous system: Alert and oriented2 . No focal neurological deficits. Extremities: Symmetric 5 x 5 power. Skin: No rashes, lesions or ulcers Psychiatry: Judgement and insight impaired. Mood & affect appropriate.     Data Reviewed: I have personally reviewed following labs and imaging studies  CBC:  Recent Labs Lab 07/03/16 2313 07/08/16 1755 07/09/16 0012 07/09/16 0514  WBC 10.9* 6.4 5.6 6.1  NEUTROABS 8.2*  --   --   --   HGB 12.1 11.6* 10.8* 10.1*  HCT 35.4* 33.9* 31.8* 29.9*  MCV 91.9 90.6 91.1 91.4  PLT 247 302 269 [id number]   Basic Metabolic Panel:  Recent Labs Lab 07/03/16 2313 07/08/16 1755 07/09/16 0012 07/09/16 0514  NA 138 140  --  142  K 3.6 3.0*  --  3.3*  CL 110 110  --  114*  CO2 20* 21*  --  21*  GLUCOSE 150* 81  --  77  BUN 23* 9  --  9  CREATININE 1.74* 2.22*  --  2.03*  CALCIUM 9.7 8.5*  --  7.6*  MG  --   --  1.0*  --    GFR: Estimated Creatinine Clearance: 15 mL/min (by C-G formula based on SCr of 2.03 mg/dL (H)). Liver Function Tests:  Recent Labs Lab 07/03/16 2313 07/08/16 1755 07/09/16 0514  AST 124* 93* 80*  ALT 129* 96* 76*  ALKPHOS 111 97 81  BILITOT 0.8 0.7 0.8  PROT 7.3 6.8 5.6*  ALBUMIN 3.5 3.3* 2.8*    Recent Labs Lab 07/03/16 2313 07/08/16 1755  LIPASE 49 43   No results for input(s): AMMONIA in the last 168 hours. Coagulation Profile: No results for input(s): INR, PROTIME in the last 168 hours. Cardiac Enzymes: No results for input(s): CKTOTAL, CKMB, CKMBINDEX, TROPONINI in the last 168 hours. BNP (last 3 results) No  results for input(s): PROBNP in the last 8760 hours. HbA1C: No results for input(s): HGBA1C in the last 72 hours. CBG: No results for input(s): GLUCAP in the last 168 hours. Lipid Profile: No results for input(s): CHOL, HDL, LDLCALC, TRIG, CHOLHDL, LDLDIRECT in the last 72 hours. Thyroid Function Tests: No results for input(s): TSH, T4TOTAL, FREET4, T3FREE, THYROIDAB in the last 72 hours. Anemia Panel: No results for input(s): VITAMINB12, FOLATE, FERRITIN, TIBC, IRON, RETICCTPCT in the last 72 hours.  Sepsis Labs:  Recent Labs Lab 07/03/16 2313 07/03/16 2319 07/08/16 1755 07/08/16 1803 07/09/16 0012 07/09/16 0514  WBC 10.9*  --  6.4  --  5.6 6.1  LATICACIDVEN  --  1.91*  --  1.99* 0.8  --     Recent Results (from the past 240 hour(s))  Urine culture  Status: Abnormal   Collection Time: 07/03/16 11:13 PM  Result Value Ref Range Status   Specimen Description URINE, RANDOM  Final   Special Requests NONE  Final   Culture (A)  Final    <10,000 COLONIES/mL INSIGNIFICANT GROWTH Performed at [facility], [address], [place], [place] [address]    Report Status 07/05/2016 FINAL  Final         Radiology Studies: Dg Abdomen Acute W/chest  Result Date: 07/08/2016 CLINICAL DATA:  abd pain with n/v/d x couple weeks. Patient went to PCP on Friday and diagnosed with UTI. Patient has PMH bladder cancer and had removed. Patient also having dark grainy stools. Hx htn, kidney stones, osteoporosis, cholecystectomy, appendectomy, hysterectomy EXAM: DG ABDOMEN ACUTE W/ 1V CHEST COMPARISON:  04/17/2016 FINDINGS: Stable linear scarring or atelectasis in the left lower lung. Atheromatous aorta. Heart size normal. No pneumothorax or effusion. Right shoulder arthroplasty prosthesis partially visualized. No free air. Scattered fluid levels scattered throughout the abdomen in loops of nondilated bowel. There are no abnormal calcifications.  Cholecystectomy clips. Regional bones  unremarkable. IMPRESSION: 1. Fluid levels in nondilated bowel suggesting early ileus versus partial obstruction 2. No free air Electronically Signed   By: Lucrezia Europe M.D.   On: 07/08/2016 18:30        Scheduled Meds: . ciprofloxacin  400 mg Intravenous QHS  . clonazepam  0.125 mg Oral QHS  . donepezil  10 mg Oral QHS  . fluticasone  2 spray Each Nare QHS  . metronidazole  500 mg Intravenous Q8H  . mirtazapine  15 mg Oral QHS  . pantoprazole  40 mg Oral Daily  . saccharomyces boulardii  250 mg Oral BID   Continuous Infusions: . 0.9 % NaCl with KCl 20 mEq / L 75 mL/hr at 07/08/16 2301     LOS: 1 day      [facility], MD [facility] Pager [phone]  If 7PM-7AM, please contact night-coverage [email or web address] Password [email or web address] 07/09/2016, 11:12 AM

## 2016-07-09 NOTE — Care Management Note (Signed)
Case Management Note  Patient Details  Name: [name] MRN: [id number] Date of Birth: [birth year]  Subjective/Objective: Pt admitted with nausea and diarrhea.                     Action/Plan: Pt lives with daughter and son in law. There are no needs.    Expected Discharge Date:   07/11/16               Expected Discharge Plan:  Home/Self Care  In-House Referral:     Discharge planning Services  CM Consult  Post Acute Care Choice:    Choice offered to:  NA  DME Arranged:  N/A DME Agency:  NA  HH Arranged:  NA HH Agency:  NA  Status of Service:  In process, will continue to follow  If discussed at Long Length of Stay Meetings, dates discussed:    Additional CommentsPurcell Mouton, RN 07/09/2016, 11:06 AM

## 2016-07-10 DIAGNOSIS — N189 Chronic kidney disease, unspecified: Secondary | ICD-10-CM

## 2016-07-10 LAB — GASTROINTESTINAL PANEL BY PCR, STOOL (REPLACES STOOL CULTURE)
ADENOVIRUS F40/41: NOT DETECTED
ASTROVIRUS: NOT DETECTED
CRYPTOSPORIDIUM: NOT DETECTED
CYCLOSPORA CAYETANENSIS: NOT DETECTED
Campylobacter species: NOT DETECTED
ENTEROTOXIGENIC E COLI (ETEC): NOT DETECTED
Entamoeba histolytica: NOT DETECTED
Enteroaggregative E coli (EAEC): NOT DETECTED
Enteropathogenic E coli (EPEC): NOT DETECTED
Giardia lamblia: NOT DETECTED
Norovirus GI/GII: NOT DETECTED
Plesimonas shigelloides: NOT DETECTED
ROTAVIRUS A: NOT DETECTED
SAPOVIRUS (I, II, IV, AND V): NOT DETECTED
SHIGELLA/ENTEROINVASIVE E COLI (EIEC): NOT DETECTED
Salmonella species: NOT DETECTED
Shiga like toxin producing E coli (STEC): NOT DETECTED
Vibrio cholerae: NOT DETECTED
Vibrio species: NOT DETECTED
Yersinia enterocolitica: NOT DETECTED

## 2016-07-10 LAB — CBC
HEMATOCRIT: 30.8 % — AB (ref 36.0–46.0)
HEMOGLOBIN: 10.2 g/dL — AB (ref 12.0–15.0)
MCH: 30.5 pg (ref 26.0–34.0)
MCHC: 33.1 g/dL (ref 30.0–36.0)
MCV: 92.2 fL (ref 78.0–100.0)
Platelets: 263 10*3/uL (ref 150–400)
RBC: 3.34 MIL/uL — ABNORMAL LOW (ref 3.87–5.11)
RDW: 14.1 % (ref 11.5–15.5)
WBC: 5.8 10*3/uL (ref 4.0–10.5)

## 2016-07-10 LAB — COMPREHENSIVE METABOLIC PANEL
ALBUMIN: 2.6 g/dL — AB (ref 3.5–5.0)
ALT: 62 U/L — ABNORMAL HIGH (ref 14–54)
AST: 71 U/L — ABNORMAL HIGH (ref 15–41)
Alkaline Phosphatase: 75 U/L (ref 38–126)
Anion gap: 4 — ABNORMAL LOW (ref 5–15)
BUN: 7 mg/dL (ref 6–20)
CHLORIDE: 117 mmol/L — AB (ref 101–111)
CO2: 21 mmol/L — AB (ref 22–32)
Calcium: 7.3 mg/dL — ABNORMAL LOW (ref 8.9–10.3)
Creatinine, Ser: 1.57 mg/dL — ABNORMAL HIGH (ref 0.44–1.00)
GFR calc Af Amer: 33 mL/min — ABNORMAL LOW (ref >60)
GFR calc non Af Amer: 29 mL/min — ABNORMAL LOW (ref >60)
GLUCOSE: 80 mg/dL (ref 65–99)
POTASSIUM: 5 mmol/L (ref 3.5–5.1)
SODIUM: 142 mmol/L (ref 135–145)
TOTAL PROTEIN: 5.4 g/dL — AB (ref 6.5–8.1)
Total Bilirubin: 0.4 mg/dL (ref 0.3–1.2)

## 2016-07-10 LAB — URINE CULTURE: Culture: NO GROWTH

## 2016-07-10 LAB — MAGNESIUM: Magnesium: 1.8 mg/dL (ref 1.7–2.4)

## 2016-07-10 MED ORDER — DEXTROSE 5 % IV SOLN
INTRAVENOUS | Status: AC
  Administered 2016-07-10: 13:00:00 via INTRAVENOUS

## 2016-07-10 NOTE — Progress Notes (Signed)
PROGRESS NOTE  [name]  [id number] DOB: [birth year]  DOA: 07/08/2016 PCP: [name], MD   Brief Narrative:  83 year old female, lives with her daughter who provides 24/7 supervision and care, ambulates with the help of a walker, PMH of bladder cancer status post cystectomy and ileal conduit (Dr. [name], Urology), dementia, HTN, seen by PCP on 07/02/16 for confusion, decreased oral intake, high colored urine and initiated treatment for UTI with Cipro. Next day she developed nausea, vomiting and abdominal pain and was seen in ED 07/03/16 >mild leukocytosis and transaminitis, CT abdomen without acute findings, able to tolerate oral challenge and was discharged from ED to follow-up with PCP. Seen by PCP 3 days later and LFT showed persistent elevation and elevated lipase. Since the ED visit, she continued to have nausea, nonbloody emesis, abdominal pain, diarrhea and altered mental status. In the ED, LFTs better, UA better, abdominal x-ray suggestive of ileus, hypokalemia. Admitted for further management. General surgery consulted.   Assessment & Plan:   Principal Problem:   Nausea vomiting and diarrhea Active Problems:   Acute renal failure (HCC)   Hypertension   Lower urinary tract infectious disease   Elevated LFTs   ARF (acute renal failure) (HCC)   Nausea, vomiting and diarrhea - DD: Acute infectious gastroenteritis, secondary to UTI, related to medications (Cipro). General surgery consultation appreciated and suspect mild ileus.  - Diet was advanced from clear liquids to full liquids on 2/23 following which patient had worsening nausea and couple of episodes of diarrhea. Diet was again downgraded to clear liquids which she has tolerated without nausea, vomiting or BM since last night. - Extensive discussion with patient and multiple family members (2 daughter's, a son-in-law and grandson) who indicated that patient develops protracted nausea, vomiting, diarrhea following  episodes of UTI treatment similar to this. As agreed with them, trial of advancing to full liquids today with antinausea medications as needed or scheduled and if tolerates, advanced to soft diet. Obviously if patient continues to have nausea and diarrhea, consider GI consultation. Discontinue Flagyl since that may also be contributing to her nausea. Discontinue Cipro since urine culture 2/23 is negative. - C Diff PCR: Neg. GI panel PCR: pending.  Recent UTI - Urine culture 07/03/16: Insignificant growth and had been on antibiotics for less than 24 hours. Repeat urine microscopy 2/22 not suggestive of UTI. Urine culture 2/23: Negative. DC all antibiotics.  Elevated LFTs - Mild. Improving. Acute hepatitis panel negative. Unclear etiology. Patient is status post cholecystectomy with chronic dilatation of hepatobiliary tract.   Acute on stage III chronic kidney disease - Baseline creatinine? 1.6-1.8. Presented with creatinine of 2.2 to which is increased to 2.03. Likely prerenal secondary to GI losses, poor oral intake and Lasix. Continue to hold Lasix. IV fluids. Did not receive IV contrast during CT on 07/04/16. Creatinine improved and may be at baseline. Continue gentle IV fluids until oral intake improves. Follow BMP in a.m.  Anemia  - may be dilutional. Follow CBCs. FOBT +. Stable. Outpatient follow-up with GI/[name]  Acute encephalopathy/dementia - Mental status changes secondary to acute illness complicating underlying moderate dementia. Improved.  Hypokalemia and hypomagnesemia - Replaced  Essential hypertension Controlled.   DVT prophylaxis: SCD's Code Status: Full Family Communication: Discussed with multiple family members (2 daughter's, a son-in-law and grandson) at bedside. Disposition Plan: DC home when medically stable possibly 2/25 pending clinical improvement and stabilization.   Consultants:   CCS-signed off  Procedures:   None  Antimicrobials:  IV  Cipro-discontinued  IV Flagyl -discontinued   Subjective: Diet was advanced from clear liquids to full liquids on 2/23 following which patient had worsening nausea and couple of episodes of diarrhea. Diet was again downgraded to clear liquids which she has tolerated without nausea, vomiting or BM since last night.   Objective:  Vitals:   07/09/16 1500 07/09/16 1800 07/09/16 2027 07/10/16 0630  BP: (!) 135/55 (!) 141/55 (!) 150/58 (!) 143/65  Pulse: 66 63 66 73  Resp: 18 18 18 18   Temp: 98.7 F (37.1 C) 99.4 F (37.4 C) 99.3 F (37.4 C) 98.3 F (36.8 C)  TempSrc: Oral Oral Oral Oral  SpO2: 95% 97% 97% 93%  Weight:    53.7 kg (118 lb 6.2 oz)  Height:        Intake/Output Summary (Last 24 hours) at 07/10/16 1203 Last data filed at 07/10/16 0747  Gross per 24 hour  Intake          1941.25 ml  Output             1250 ml  Net           691.25 ml   Filed Weights   07/08/16 2059 07/09/16 0505 07/10/16 0630  Weight: 52.6 kg (115 lb 15.4 oz) 54.4 kg (119 lb 14.9 oz) 53.7 kg (118 lb 6.2 oz)    Examination:  General exam: Pleasant elderly female sitting up comfortably in bed. Does not look septic or toxic.  Respiratory system: Clear to auscultation. Respiratory effort normal. Cardiovascular system: S1 & S2 heard, RRR. No JVD, murmurs, rubs, gallops or clicks. No pedal edema. Tele: SR. Gastrointestinal system: Abdomen is nondistended, soft and nontender. No organomegaly or masses felt. Normal bowel sounds heard. Ileal conduit draining clear straw-colored urine.  Central nervous system: Alert and oriented2 . No focal neurological deficits. Extremities: Symmetric 5 x 5 power. Skin: No rashes, lesions or ulcers Psychiatry: Judgement and insight impaired. Mood & affect appropriate.     Data Reviewed: I have personally reviewed following labs and imaging studies  CBC:  Recent Labs Lab 07/03/16 2313 07/08/16 1755 07/09/16 0012 07/09/16 0514 07/10/16 0516  WBC 10.9* 6.4  5.6 6.1 5.8  NEUTROABS 8.2*  --   --   --   --   HGB 12.1 11.6* 10.8* 10.1* 10.2*  HCT 35.4* 33.9* 31.8* 29.9* 30.8*  MCV 91.9 90.6 91.1 91.4 92.2  PLT 247 302 269 266 [id number]   Basic Metabolic Panel:  Recent Labs Lab 07/03/16 2313 07/08/16 1755 07/09/16 0012 07/09/16 0514 07/10/16 0516  NA 138 140  --  142 142  K 3.6 3.0*  --  3.3* 5.0  CL 110 110  --  114* 117*  CO2 20* 21*  --  21* 21*  GLUCOSE 150* 81  --  77 80  BUN 23* 9  --  9 7  CREATININE 1.74* 2.22*  --  2.03* 1.57*  CALCIUM 9.7 8.5*  --  7.6* 7.3*  MG  --   --  1.0*  --  1.8   GFR: Estimated Creatinine Clearance: 19.4 mL/min (by C-G formula based on SCr of 1.57 mg/dL (H)). Liver Function Tests:  Recent Labs Lab 07/03/16 2313 07/08/16 1755 07/09/16 0514 07/10/16 0516  AST 124* 93* 80* 71*  ALT 129* 96* 76* 62*  ALKPHOS 111 97 81 75  BILITOT 0.8 0.7 0.8 0.4  PROT 7.3 6.8 5.6* 5.4*  ALBUMIN 3.5 3.3* 2.8* 2.6*    Recent Labs Lab 07/03/16  2313 07/08/16 1755  LIPASE 49 43   No results for input(s): AMMONIA in the last 168 hours. Coagulation Profile: No results for input(s): INR, PROTIME in the last 168 hours. Cardiac Enzymes: No results for input(s): CKTOTAL, CKMB, CKMBINDEX, TROPONINI in the last 168 hours. BNP (last 3 results) No results for input(s): PROBNP in the last 8760 hours. HbA1C: No results for input(s): HGBA1C in the last 72 hours. CBG: No results for input(s): GLUCAP in the last 168 hours. Lipid Profile: No results for input(s): CHOL, HDL, LDLCALC, TRIG, CHOLHDL, LDLDIRECT in the last 72 hours. Thyroid Function Tests: No results for input(s): TSH, T4TOTAL, FREET4, T3FREE, THYROIDAB in the last 72 hours. Anemia Panel: No results for input(s): VITAMINB12, FOLATE, FERRITIN, TIBC, IRON, RETICCTPCT in the last 72 hours.  Sepsis Labs:  Recent Labs Lab 07/03/16 2319 07/08/16 1755 07/08/16 1803 07/09/16 0012 07/09/16 0514 07/10/16 0516  WBC  --  6.4  --  5.6 6.1 5.8  LATICACIDVEN  1.91*  --  1.99* 0.8  --   --     Recent Results (from the past 240 hour(s))  Urine culture     Status: Abnormal   Collection Time: 07/03/16 11:13 PM  Result Value Ref Range Status   Specimen Description URINE, RANDOM  Final   Special Requests NONE  Final   Culture (A)  Final    <10,000 COLONIES/mL INSIGNIFICANT GROWTH Performed at [facility], [address] [place], [place] [address]    Report Status 07/05/2016 FINAL  Final  Culture, Urine     Status: None   Collection Time: 07/09/16  1:33 AM  Result Value Ref Range Status   Specimen Description URINE, CLEAN CATCH  Final   Special Requests NONE  Final   Culture   Final    NO GROWTH Performed at [facility], [place] [address] [place], [place] [address]    Report Status 07/10/2016 FINAL  Final  C difficile quick scan w PCR reflex     Status: None   Collection Time: 07/09/16  2:57 PM  Result Value Ref Range Status   C Diff antigen NEGATIVE NEGATIVE Final   C Diff toxin NEGATIVE NEGATIVE Final   C Diff interpretation No C. difficile detected.  Final         Radiology Studies: Dg Abdomen Acute W/chest  Result Date: 07/08/2016 CLINICAL DATA:  abd pain with n/v/d x couple weeks. Patient went to PCP on Friday and diagnosed with UTI. Patient has PMH bladder cancer and had removed. Patient also having dark grainy stools. Hx htn, kidney stones, osteoporosis, cholecystectomy, appendectomy, hysterectomy EXAM: DG ABDOMEN ACUTE W/ 1V CHEST COMPARISON:  04/17/2016 FINDINGS: Stable linear scarring or atelectasis in the left lower lung. Atheromatous aorta. Heart size normal. No pneumothorax or effusion. Right shoulder arthroplasty prosthesis partially visualized. No free air. Scattered fluid levels scattered throughout the abdomen in loops of nondilated bowel. There are no abnormal calcifications.  Cholecystectomy clips. Regional bones unremarkable. IMPRESSION: 1. Fluid levels in nondilated bowel suggesting early ileus versus  partial obstruction 2. No free air Electronically Signed   By: Lucrezia Europe M.D.   On: 07/08/2016 18:30        Scheduled Meds: . ciprofloxacin  400 mg Intravenous QHS  . clonazepam  0.125 mg Oral QHS  . donepezil  10 mg Oral QHS  . fluticasone  2 spray Each Nare QHS  . metronidazole  500 mg Intravenous Q8H  . mirtazapine  15 mg Oral QHS  .  pantoprazole  40 mg Oral Daily  . saccharomyces boulardii  250 mg Oral BID   Continuous Infusions:    LOS: 2 days      [facility], MD [facility] Pager [phone]  If 7PM-7AM, please contact night-coverage [email or web address] Password [facility] 07/10/2016, 12:03 PM

## 2016-07-10 NOTE — Progress Notes (Signed)
Patient ID: [name], female   DOB: 1927/08/15, 83 y.o.   MRN: [id number]       Subjective: Had nausea without vomiting and increased diarrhea after full liquids yesterday. Then tolerated clear liquids well. Trying for liquids again today. No abdominal pain. States she wants to go home.  Objective: Vital signs in last 24 hours: Temp:  [98.3 F (36.8 C)-99.4 F (37.4 C)] 98.3 F (36.8 C) (02/24 0630) Pulse Rate:  [63-73] 73 (02/24 0630) Resp:  [18] 18 (02/24 0630) BP: (135-150)/(55-65) 143/65 (02/24 0630) SpO2:  [93 %-97 %] 93 % (02/24 0630) Weight:  [53.7 kg (118 lb 6.2 oz)] 53.7 kg (118 lb 6.2 oz) (02/24 0630) Last BM Date: 07/09/16  Intake/Output from previous day: 02/23 0701 - 02/24 0700 In: 1841.3 [P.O.:300; I.V.:1041.3; IV Piggyback:500] Out: 1250 [Urine:1250] Intake/Output this shift: Total I/O In: 100 [IV Piggyback:100] Out: -   General appearance: Alert elderly Caucasian female in no distress GI: normal findings: soft, non-tender and And nondistended. Urostomy right mid abdomen.  Lab Results:   Recent Labs  07/09/16 0514 07/10/16 0516  WBC 6.1 5.8  HGB 10.1* 10.2*  HCT 29.9* 30.8*  PLT 266 263   BMET  Recent Labs  07/09/16 0514 07/10/16 0516  NA 142 142  K 3.3* 5.0  CL 114* 117*  CO2 21* 21*  GLUCOSE 77 80  BUN 9 7  CREATININE 2.03* 1.57*  CALCIUM 7.6* 7.3*     Studies/Results: Dg Abdomen Acute W/chest  Result Date: 07/08/2016 CLINICAL DATA:  abd pain with n/v/d x couple weeks. Patient went to PCP on Friday and diagnosed with UTI. Patient has PMH bladder cancer and had removed. Patient also having dark grainy stools. Hx htn, kidney stones, osteoporosis, cholecystectomy, appendectomy, hysterectomy EXAM: DG ABDOMEN ACUTE W/ 1V CHEST COMPARISON:  04/17/2016 FINDINGS: Stable linear scarring or atelectasis in the left lower lung. Atheromatous aorta. Heart size normal. No pneumothorax or effusion. Right shoulder arthroplasty prosthesis partially  visualized. No free air. Scattered fluid levels scattered throughout the abdomen in loops of nondilated bowel. There are no abnormal calcifications.  Cholecystectomy clips. Regional bones unremarkable. IMPRESSION: 1. Fluid levels in nondilated bowel suggesting early ileus versus partial obstruction 2. No free air Electronically Signed   By: Lucrezia Europe M.D.   On: 07/08/2016 18:30    Anti-infectives: Anti-infectives    Start     Dose/Rate Route Frequency Ordered Stop   07/09/16 0015  ciprofloxacin (CIPRO) IVPB 400 mg     400 mg 200 mL/hr over 60 Minutes Intravenous Daily at bedtime 07/09/16 0005     07/09/16 0000  metroNIDAZOLE (FLAGYL) IVPB 500 mg     500 mg 100 mL/hr over 60 Minutes Intravenous Every 8 hours 07/08/16 2356        Assessment/Plan: Nausea and diarrhea following onset of antibiotic treatment for recurrent UTI. Patient's family states that this often occurs when she gets a UTI. Reviewing imaging and examining the patient I see no evidence of a bowel obstruction or surgical problem. C. difficile is negative. Gastrointestinal panel pending. Stool is heme positive uncertain significance. May need colonoscopy at some point. No surgical issues. Would consider GI consult if problems persist but she appears to be improving today. We will sign off.    LOS: 2 days    Christo Hain T 07/10/2016

## 2016-07-10 NOTE — Evaluation (Signed)
Physical Therapy Evaluation Patient Details Name: [name] MRN: [id number] DOB: 1927-12-08 Today's Date: 07/10/2016   History of Present Illness   [name] is a 83 y.o. female with bladder cancer status post cystectomy and ileal conduit has been experiencing diarrhea with nausea vomiting almost last 10 days prior to adm; PMhx: bladder CA with urostomy, dementia  Clinical Impression  Patient evaluated by Physical Therapy with no further acute PT needs identified. All education has been completed and the patient has no further questions.  See below for any follow-up Physical Therapy or equipment needs. PT is signing off. Thank you for this referral. Pt dtrs report that is furthter than pt "has walked in forever"; pt dyspneic (3/4)after amb but recovered quickly(0/4); encouraged pt/dtrs to amb with her as tol and work on incr her activity at home; pt and dtrs in agreement; no further needs;      Follow Up Recommendations No PT follow up;Supervision/Assistance - 24 hour    Equipment Recommendations  None recommended by PT    Recommendations for Other Services       Precautions / Restrictions Precautions Precautions: Fall Restrictions Weight Bearing Restrictions: No      Mobility  Bed Mobility               General bed mobility comments: NT--pt in chair  Transfers Overall transfer level: Needs assistance Equipment used: Rolling walker (2 wheeled) Transfers: Sit to/from Stand Sit to Stand: Supervision         General transfer comment: for safety and lines  Ambulation/Gait Ambulation/Gait assistance: Supervision;Min guard Ambulation Distance (Feet): 280 Feet Assistive device: Rolling walker (2 wheeled) Gait Pattern/deviations: Step-through pattern     General Gait Details: pt denies need for rest but noted incr WOB after distance above; supervision for safety with RW  Stairs            Wheelchair Mobility    Modified Rankin (Stroke Patients  Only)       Balance Overall balance assessment: Needs assistance   Sitting balance-Leahy Scale: Good       Standing balance-Leahy Scale: Fair Standing balance comment: at least fair                             Pertinent Vitals/Pain Pain Assessment: No/denies pain    Home Living Family/patient expects to be discharged to:: Private residence Living Arrangements: Children Available Help at Discharge: Family;Available 24 hours/day Type of Home: House Home Access: Stairs to enter Entrance Stairs-Rails: None Entrance Stairs-Number of Steps: 1 Home Layout: One level Home Equipment: Walker - 2 wheels;Wheelchair - manual;Cane - single point      Prior Function Level of Independence: Needs assistance   Gait / Transfers Assistance Needed: assist/supervision with amb with RW  ADL's / Homemaking Assistance Needed: assist (dtr)        Hand Dominance        Extremity/Trunk Assessment   Upper Extremity Assessment Upper Extremity Assessment: Overall WFL for tasks assessed    Lower Extremity Assessment Lower Extremity Assessment: Overall WFL for tasks assessed       Communication      Cognition Arousal/Alertness: Awake/alert Behavior During Therapy: WFL for tasks assessed/performed Overall Cognitive Status: History of cognitive impairments - at baseline                 General Comments: follows commands, oriented to place and self    General Comments  Exercises     Assessment/Plan    PT Assessment Patent does not need any further PT services  PT Problem List         PT Treatment Interventions      PT Goals (Current goals can be found in the Care Plan section)  Acute Rehab PT Goals Patient Stated Goal: home, incr activity PT Goal Formulation: All assessment and education complete, DC therapy    Frequency     Barriers to discharge        Co-evaluation               End of Session Equipment Utilized During Treatment:  Gait belt Activity Tolerance: Patient tolerated treatment well Patient left: in chair;with call bell/phone within reach;with family/visitor present Nurse Communication: Mobility status PT Visit Diagnosis: Other abnormalities of gait and mobility (R26.89)         Time: 1435-1459 PT Time Calculation (min) (ACUTE ONLY): 24 min   Charges:   PT Evaluation $PT Eval Low Complexity: 1 Procedure PT Treatments $Gait Training: 8-22 mins   PT G Codes:         Eian Vandervelden 08-04-16, 3:04 PM

## 2016-07-11 DIAGNOSIS — N183 Chronic kidney disease, stage 3 (moderate): Secondary | ICD-10-CM

## 2016-07-11 LAB — COMPREHENSIVE METABOLIC PANEL
ALT: 55 U/L — AB (ref 14–54)
AST: 71 U/L — AB (ref 15–41)
Albumin: 2.8 g/dL — ABNORMAL LOW (ref 3.5–5.0)
Alkaline Phosphatase: 77 U/L (ref 38–126)
Anion gap: 6 (ref 5–15)
BUN: 6 mg/dL (ref 6–20)
CHLORIDE: 114 mmol/L — AB (ref 101–111)
CO2: 19 mmol/L — AB (ref 22–32)
Calcium: 7.9 mg/dL — ABNORMAL LOW (ref 8.9–10.3)
Creatinine, Ser: 1.44 mg/dL — ABNORMAL HIGH (ref 0.44–1.00)
GFR, EST AFRICAN AMERICAN: 37 mL/min — AB (ref >60)
GFR, EST NON AFRICAN AMERICAN: 32 mL/min — AB (ref >60)
Glucose, Bld: 109 mg/dL — ABNORMAL HIGH (ref 65–99)
POTASSIUM: 4.4 mmol/L (ref 3.5–5.1)
SODIUM: 139 mmol/L (ref 135–145)
Total Bilirubin: 0.4 mg/dL (ref 0.3–1.2)
Total Protein: 5.6 g/dL — ABNORMAL LOW (ref 6.5–8.1)

## 2016-07-11 NOTE — Discharge Summary (Signed)
Physician Discharge Summary  [name] [id number] DOB: 20-Apr-1928  PCP: Rachel Inches, MD  Admit date: 07/08/2016 Discharge date: 07/11/2016  Recommendations for Outpatient Follow-up:  1. [name], PCP In 3 days with repeat labs (CBC & CMP).  Home Health: None Equipment/Devices: None    Discharge Condition: Improved and stable  CODE STATUS: Full  Diet recommendation: Heart healthy diet  Discharge Diagnoses:  Principal Problem:   Nausea vomiting and diarrhea Active Problems:   Acute renal failure (HCC)   Hypertension   Lower urinary tract infectious disease   Elevated LFTs   ARF (acute renal failure) (HCC)   Brief/Interim Summary: 83 year old female, lives with her daughter who provides 24/7 supervision and care, ambulates with the help of a walker, PMH of bladder cancer status post cystectomy and ileal conduit (Dr. Louis Schneider, Urology), dementia, HTN, seen by PCP on 07/02/16 for confusion, decreased oral intake, high colored urine and initiated treatment for UTI with Cipro. Has history of recurrent UTI and is on antibiotic suppression treatment. Next day she developed nausea, vomiting and abdominal pain and was seen in ED 07/03/16 >mild leukocytosis and transaminitis, CT abdomen without acute findings, able to tolerate oral challenge and was discharged from ED to follow-up with PCP. Seen by PCP 3 days later and LFT showed persistent elevation and elevated lipase. Since the ED visit, she continued to have nausea, nonbloody emesis, abdominal pain, diarrhea and altered mental status. In the ED, LFTs better, UA better, abdominal x-ray suggestive of ileus, hypokalemia. Admitted for further management. General surgery consulted.   Assessment & Plan:   Nausea, vomiting and diarrhea - DD: Acute viral gastroenteritis, secondary to UTI, related to medications (Cipro). General surgery consultation appreciated and suspect mild ileus without surgical issues.  - Extensive  discussion with patient and multiple family members (2 daughter's, a son-in-law and grandson) who indicated that patient develops protracted nausea, vomiting, diarrhea following episodes of UTI treatment similar to this.  - Discontinue Flagyl since that may also be contributing to her nausea. Discontinue Cipro since urine culture 2/23 is negative. - C Diff PCR: Neg. GI panel PCR: Negative. - Diet was gradually advanced over 48 hours which she has tolerated without any further nausea, vomiting or abdominal pain. Her daughter/healthcare power of attorney reports that patient has inconsistent and poor oral intake, especially of liquids and has been advised that they should continue to encourage and there is no specific medical measures for this. Due to inconsistent oral intake, patient remains at high risk for recurrent episodes of dehydration and acute kidney injury. Unclear as to why she is on Lasix and due to the dehydration risk, this has been discontinued and can be reassessed during outpatient follow-up with PCP.  Recent UTI - Urine culture 07/03/16: Insignificant growth and had been on antibiotics for less than 24 hours. Repeat urine microscopy 2/22 not suggestive of UTI. Urine culture 2/23: Negative. DC all antibiotics.  Elevated LFTs - Mild. Unclear etiology. Patient is status post cholecystectomy with chronic dilatation of hepatobiliary tract. H&P indicates recent acute hepatitis panel was negative-unclear where this was performed and can be pursued as outpatient with PCP as deemed necessary. Improved. Follow repeat LFTs in a few days as outpatient.   Acute on stage III chronic kidney disease - Baseline creatinine? 1.6-1.8. Presented with creatinine of 2.2 to which is increased to 2.03. Likely prerenal secondary to GI losses, poor oral intake and Lasix. Lasix discontinued during this admission. IV fluids. Did not receive IV contrast during CT  on 07/04/16. Creatinine improved and may be at  baseline. Creatinine down to 1.4.  Anemia  - may be dilutional. Follow CBCs. FOBT +. Stable. Outpatient follow-up with GI/Dr. Benson Schneider  Acute encephalopathy/dementia - Mental status changes secondary to acute illness complicating underlying moderate dementia. Altered mental status resolved. Mental status back to baseline.  Hypokalemia and hypomagnesemia - Replaced  Essential hypertension Controlled.  Adult failure to thrive Multifactorial secondary to advanced age, moderate dementia, multiple significant comorbidities, recurrent UTI with complications. Daughter expressed concern and frustration with patient's poor and inconsistent oral fluid intake, recurrent acute illnesses leading to hospitalization. Discussed options of palliative care consultation and consideration for hospice going forward if this becomes a recurrent issue. She was appreciative of this discussion and is aware of hospice (took care of her mother-in-law who was on hospice). She was advised to discuss this with patient's PCP.   Consultants:   CCS-signed off  Procedures:   Patient has an ileal conduit and family changes her urinary bag every other day.    Discharge Instructions  Discharge Instructions    (HEART FAILURE PATIENTS) Call MD:  Anytime you have any of the following symptoms: 1) 3 pound weight gain in 24 hours or 5 pounds in 1 week 2) shortness of breath, with or without a dry hacking cough 3) swelling in the hands, feet or stomach 4) if you have to sleep on extra pillows at night in order to breathe.    Complete by:  As directed    Call MD for:    Complete by:  As directed    Recurrent nausea, vomiting or diarrhea.   Call MD for:  difficulty breathing, headache or visual disturbances    Complete by:  As directed    Call MD for:  extreme fatigue    Complete by:  As directed    Call MD for:  persistant dizziness or light-headedness    Complete by:  As directed    Call MD for:  severe uncontrolled  pain    Complete by:  As directed    Call MD for:  temperature >100.4    Complete by:  As directed    Diet - low sodium heart healthy    Complete by:  As directed    Increase activity slowly    Complete by:  As directed        Medication List    STOP taking these medications   ciprofloxacin 500 MG tablet Commonly known as:  CIPRO   furosemide 20 MG tablet Commonly known as:  LASIX   potassium chloride SA 20 MEQ tablet Commonly known as:  K-DUR,KLOR-CON     TAKE these medications   AZO-CRANBERRY 450 MG Tabs Generic drug:  Cranberry Take 450 mg by mouth at bedtime.   cetirizine 10 MG tablet Commonly known as:  ZYRTEC Take 10 mg by mouth at bedtime.   clonazepam 0.125 MG disintegrating tablet Commonly known as:  KLONOPIN Take 0.125 mg by mouth at bedtime.   donepezil 10 MG tablet Commonly known as:  ARICEPT Take 10 mg by mouth at bedtime.   fluticasone 50 MCG/ACT nasal spray Commonly known as:  FLONASE Place 2 sprays into both nostrils at bedtime.   meclizine 12.5 MG tablet Commonly known as:  ANTIVERT Take 12.5 mg by mouth 3 (three) times daily as needed for dizziness.   mirtazapine 7.5 MG tablet Commonly known as:  REMERON Take 15 mg by mouth at bedtime.   nitrofurantoin 50 MG capsule Commonly  known as:  MACRODANTIN Take 50 mg by mouth at bedtime.   omeprazole 40 MG capsule Commonly known as:  PRILOSEC Take 40 mg by mouth at bedtime.   ondansetron 4 MG disintegrating tablet Commonly known as:  ZOFRAN-ODT Take 4 mg by mouth every 8 (eight) hours as needed for nausea or vomiting.   saccharomyces boulardii 250 MG capsule Commonly known as:  FLORASTOR Take 1 capsule (250 mg total) by mouth 2 (two) times daily.      Follow-up Information    BOUSKA,DAVID E, MD Follow up in 3 day(s).   Specialty:  Family Medicine Why:  To be seen with repeat labs (CBC & BMP). Contact information: [address] [place]  [address] [phone]          Allergies  Allergen Reactions  . Other Shortness Of Breath and Other (See Comments)    Pt was given some kind of breathing treatment (unsure of name).  . Codeine Nausea And Vomiting  . Metoclopramide Other (See Comments)    Reaction:  Makes patient "climb the walls"     Procedures/Studies: Ct Abdomen Pelvis Wo Contrast  Result Date: 07/04/2016 CLINICAL DATA:  Urinary tract infection.  Elevated liver enzymes. EXAM: CT ABDOMEN AND PELVIS WITHOUT CONTRAST TECHNIQUE: Multidetector CT imaging of the abdomen and pelvis was performed following the standard protocol without IV contrast. COMPARISON:  11/21/2015 CT FINDINGS: Lower chest: Normal sized cardiac chambers without pericardial effusion. Coronary arteriosclerosis. Scarring and/or atelectasis at each lung base. Hepatobiliary: Cholecystectomy. Calcified granulomata within the liver. Chronic stable mild intrahepatic ductal dilatation in the left hepatic lobe since 2017 without choledocholithiasis. This can be seen in the setting of prior cholecystectomy no space-occupying mass noted. Pancreas: Atrophic pancreas with a few scattered calcifications within the body of the pancreas possibly related to prior pancreatitis. Spleen: No splenomegaly. Tortuous splenic artery with calcifications near the hilum. No definite splenic artery aneurysm. Adrenals/Urinary Tract: Normal bilateral adrenal glands. Chronic ectasia of the renal collecting systems bilaterally without obstructing calculus. Perinephric fat stranding may be related to urinary tract infection. Stable exophytic cyst off the lower pole of the left kidney measuring 1.7 cm. Status post cystectomy with ileal conduit. Stomach/Bowel: No obstruction or inflammation. Vascular/Lymphatic: Aortoiliac and branch vessel atherosclerosis. No aneurysm. No mass or lymphadenopathy. Reproductive: Hysterectomy.  No adnexal mass. Other: No ascites or pneumoperitoneum. Musculoskeletal: L2-3  degenerative disc disease. Small Tarlov cysts of the sacrum. No acute osseous abnormality. IMPRESSION: 1. Chronic cholecystectomy with chronic stable intra and extrahepatic ductal dilatation which may represent reservoir effect from cholecystectomy. 2. Cystectomy with ileal conduit. Chronic ectasia of both renal collecting systems without definite obstruction. 3. Hepatic granulomas. Stable 17 mm cyst off the lower pole the left kidney. Electronically Signed   By: Ashley Royalty M.D.   On: 07/04/2016 00:54   Dg Abdomen Acute W/chest  Result Date: 07/08/2016 CLINICAL DATA:  abd pain with n/v/d x couple weeks. Patient went to PCP on Friday and diagnosed with UTI. Patient has PMH bladder cancer and had removed. Patient also having dark grainy stools. Hx htn, kidney stones, osteoporosis, cholecystectomy, appendectomy, hysterectomy EXAM: DG ABDOMEN ACUTE W/ 1V CHEST COMPARISON:  04/17/2016 FINDINGS: Stable linear scarring or atelectasis in the left lower lung. Atheromatous aorta. Heart size normal. No pneumothorax or effusion. Right shoulder arthroplasty prosthesis partially visualized. No free air. Scattered fluid levels scattered throughout the abdomen in loops of nondilated bowel. There are no abnormal calcifications.  Cholecystectomy clips. Regional bones  unremarkable. IMPRESSION: 1. Fluid levels in nondilated bowel suggesting early ileus versus partial obstruction 2. No free air Electronically Signed   By: Lucrezia Europe M.D.   On: 07/08/2016 18:30      Subjective:  denies complaints. As per daughter at bedside, has tolerated soft diet last evening and this morning for breakfast. No nausea, vomiting or abdominal pain.  Discharge Exam:  Vitals:   07/10/16 0630 07/10/16 1511 07/10/16 2105 07/11/16 0522  BP: (!) 143/65 (!) 149/63 (!) 133/47 (!) 151/63  Pulse: 73 69 68 65  Resp: 18 18 18 18   Temp: 98.3 F (36.8 C) 98.8 F (37.1 C) 98.8 F (37.1 C) 98.5 F (36.9 C)  TempSrc: Oral Oral Oral Oral  SpO2:  93% 93% 96% 95%  Weight: 53.7 kg (118 lb 6.2 oz)     Height:        General exam: Pleasant elderly female sitting up comfortably in chair this morning. Does not look septic or toxic.  Respiratory system: Clear to auscultation. Respiratory effort normal. Cardiovascular system: S1 & S2 heard, RRR. No JVD, murmurs, rubs, gallops or clicks. No pedal edema.  Gastrointestinal system: Abdomen is nondistended, soft and nontender. No organomegaly or masses felt. Normal bowel sounds heard. Ileal conduit draining clear straw-colored urine.  Central nervous system: Alert and oriented2 . No focal neurological deficits. Extremities: Symmetric 5 x 5 power. Skin: No rashes, lesions or ulcers Psychiatry: Judgement and insight impaired. Mood & affect appropriate.     The results of significant diagnostics from this hospitalization (including imaging, microbiology, ancillary and laboratory) are listed below for reference.     Microbiology: Recent Results (from the past 240 hour(s))  Urine culture     Status: Abnormal   Collection Time: 07/03/16 11:13 PM  Result Value Ref Range Status   Specimen Description URINE, RANDOM  Final   Special Requests NONE  Final   Culture (A)  Final    <10,000 COLONIES/mL INSIGNIFICANT GROWTH Performed at [facility], [address], [place], [place] [address]    Report Status 07/05/2016 FINAL  Final  Culture, Urine     Status: None   Collection Time: 07/09/16  1:33 AM  Result Value Ref Range Status   Specimen Description URINE, CLEAN CATCH  Final   Special Requests NONE  Final   Culture   Final    NO GROWTH Performed at [facility], [place] [address], [place], [place] [address]    Report Status 07/10/2016 FINAL  Final  C difficile quick scan w PCR reflex     Status: None   Collection Time: 07/09/16  2:57 PM  Result Value Ref Range Status   C Diff antigen NEGATIVE NEGATIVE Final   C Diff toxin NEGATIVE NEGATIVE Final   C Diff interpretation No C.  difficile detected.  Final  Gastrointestinal Panel by PCR , Stool     Status: None   Collection Time: 07/09/16  2:57 PM  Result Value Ref Range Status   Campylobacter species NOT DETECTED NOT DETECTED Final   Plesimonas shigelloides NOT DETECTED NOT DETECTED Final   Salmonella species NOT DETECTED NOT DETECTED Final   Yersinia enterocolitica NOT DETECTED NOT DETECTED Final   Vibrio species NOT DETECTED NOT DETECTED Final   Vibrio cholerae NOT DETECTED NOT DETECTED Final   Enteroaggregative E coli (EAEC) NOT DETECTED NOT DETECTED Final   Enteropathogenic E coli (EPEC) NOT DETECTED NOT DETECTED Final   Enterotoxigenic E coli (ETEC) NOT DETECTED NOT DETECTED  Final   Shiga like toxin producing E coli (STEC) NOT DETECTED NOT DETECTED Final   Shigella/Enteroinvasive E coli (EIEC) NOT DETECTED NOT DETECTED Final   Cryptosporidium NOT DETECTED NOT DETECTED Final   Cyclospora cayetanensis NOT DETECTED NOT DETECTED Final   Entamoeba histolytica NOT DETECTED NOT DETECTED Final   Giardia lamblia NOT DETECTED NOT DETECTED Final   Adenovirus F40/41 NOT DETECTED NOT DETECTED Final   Astrovirus NOT DETECTED NOT DETECTED Final   Norovirus GI/GII NOT DETECTED NOT DETECTED Final   Rotavirus A NOT DETECTED NOT DETECTED Final   Sapovirus (I, II, IV, and V) NOT DETECTED NOT DETECTED Final     Labs: BNP (last 3 results)  Recent Labs  11/14/15 1725 04/17/16 1706  BNP 194.6* [id number]   Basic Metabolic Panel:  Recent Labs Lab 07/08/16 1755 07/09/16 0012 07/09/16 0514 07/10/16 0516 07/11/16 0432  NA 140  --  142 142 139  K 3.0*  --  3.3* 5.0 4.4  CL 110  --  114* 117* 114*  CO2 21*  --  21* 21* 19*  GLUCOSE 81  --  77 80 109*  BUN 9  --  9 7 6   CREATININE 2.22*  --  2.03* 1.57* 1.44*  CALCIUM 8.5*  --  7.6* 7.3* 7.9*  MG  --  1.0*  --  1.8  --    Liver Function Tests:  Recent Labs Lab 07/08/16 1755 07/09/16 0514 07/10/16 0516 07/11/16 0432  AST 93* 80* 71* 71*  ALT 96* 76* 62* 55*   ALKPHOS 97 81 75 77  BILITOT 0.7 0.8 0.4 0.4  PROT 6.8 5.6* 5.4* 5.6*  ALBUMIN 3.3* 2.8* 2.6* 2.8*    Recent Labs Lab 07/08/16 1755  LIPASE 43   CBC:  Recent Labs Lab 07/08/16 1755 07/09/16 0012 07/09/16 0514 07/10/16 0516  WBC 6.4 5.6 6.1 5.8  HGB 11.6* 10.8* 10.1* 10.2*  HCT 33.9* 31.8* 29.9* 30.8*  MCV 90.6 91.1 91.4 92.2  PLT 302 269 266 263   Urinalysis    Component Value Date/Time   COLORURINE YELLOW 07/08/2016 2053   APPEARANCEUR HAZY (A) 07/08/2016 2053   LABSPEC 1.003 (L) 07/08/2016 2053   PHURINE 6.0 07/08/2016 2053   GLUCOSEU NEGATIVE 07/08/2016 2053   HGBUR NEGATIVE 07/08/2016 2053   BILIRUBINUR NEGATIVE 07/08/2016 2053   KETONESUR NEGATIVE 07/08/2016 2053   PROTEINUR NEGATIVE 07/08/2016 2053   UROBILINOGEN 0.2 05/10/2012 1123   NITRITE NEGATIVE 07/08/2016 2053   LEUKOCYTESUR NEGATIVE 07/08/2016 2053    Discussed in detail with patient's daughter/healthcare power of attorney at bedside. Updated care and answered questions.    Time coordinating discharge: Over 30 minutes  SIGNED:  Vernell Leep, MD, FACP, [place]. [facility] Pager [phone]  If 7PM-7AM, please contact night-coverage [email or web address] Password [facility] 07/11/2016, 12:43 PM

## 2016-07-11 NOTE — Discharge Instructions (Signed)
Viral Gastroenteritis, Adult °Viral gastroenteritis is also known as the stomach flu. This condition is caused by various viruses. These viruses can be passed from person to person very easily (are very contagious). This condition may affect your stomach, small intestine, and large intestine. It can cause sudden watery diarrhea, fever, and vomiting. °Diarrhea and vomiting can make you feel weak and cause you to become dehydrated. You may not be able to keep fluids down. Dehydration can make you tired and thirsty, cause you to have a dry mouth, and decrease how often you urinate. Older adults and people with other diseases or a weak immune system are at higher risk for dehydration. °It is important to replace the fluids that you lose from diarrhea and vomiting. If you become severely dehydrated, you may need to get fluids through an IV tube. °What are the causes? °Gastroenteritis is caused by various viruses, including rotavirus and norovirus. Norovirus is the most common cause in adults. °You can get sick by eating food, drinking water, or touching a surface contaminated with one of these viruses. You can also get sick from sharing utensils or other personal items with an infected person. °What increases the risk? °This condition is more likely to develop in people: °· Who have a weak defense system (immune system). °· Who live with one or more children who are younger than 2 years old. °· Who live in a nursing home. °· Who go on cruise ships. °What are the signs or symptoms? °Symptoms of this condition start suddenly 1-2 days after exposure to a virus. Symptoms may last a few days or as long as a week. The most common symptoms are watery diarrhea and vomiting. Other symptoms include: °· Fever. °· Headache. °· Fatigue. °· Pain in the abdomen. °· Chills. °· Weakness. °· Nausea. °· Muscle aches. °· Loss of appetite. °How is this diagnosed? °This condition is diagnosed with a medical history and physical exam. You may  also have a stool test to check for viruses or other infections. °How is this treated? °This condition typically goes away on its own. The focus of treatment is to restore lost fluids (rehydration). Your health care provider may recommend that you take an oral rehydration solution (ORS) to replace important salts and minerals (electrolytes) in your body. Severe cases of this condition may require giving fluids through an IV tube. °Treatment may also include medicine to help with your symptoms. °Follow these instructions at home: °Follow instructions from your health care provider about how to care for yourself at home. °Eating and drinking °Follow these recommendations as told by your health care provider: °· Take an ORS. This is a drink that is sold at pharmacies and retail stores. °· Drink clear fluids in small amounts as you are able. Clear fluids include water, ice chips, diluted fruit juice, and low-calorie sports drinks. °· Eat bland, easy-to-digest foods in small amounts as you are able. These foods include bananas, applesauce, rice, lean meats, toast, and crackers. °· Avoid fluids that contain a lot of sugar or caffeine, such as energy drinks, sports drinks, and soda. °· Avoid alcohol. °· Avoid spicy or fatty foods. °General instructions °· Drink enough fluid to keep your urine clear or pale yellow. °· Wash your hands often. If soap and water are not available, use hand sanitizer. °· Make sure that all people in your household wash their hands well and often. °· Take over-the-counter and prescription medicines only as told by your health care provider. °· Rest   at home while you recover.  Watch your condition for any changes.  Take a warm bath to relieve any burning or pain from frequent diarrhea episodes.  Keep all follow-up visits as told by your health care provider. This is important. Contact a health care provider if:  You cannot keep fluids down.  Your symptoms get worse.  You have new  symptoms.  You feel light-headed or dizzy.  You have muscle cramps. Get help right away if:  You have chest pain.  You feel extremely weak or you faint.  You see blood in your vomit.  Your vomit looks like coffee grounds.  You have bloody or black stools or stools that look like tar.  You have a severe headache, a stiff neck, or both.  You have a rash.  You have severe pain, cramping, or bloating in your abdomen.  You have trouble breathing or you are breathing very quickly.  Your heart is beating very quickly.  Your skin feels cold and clammy.  You feel confused.  You have pain when you urinate.  You have signs of dehydration, such as:  Dark urine, very little urine, or no urine.  Cracked lips.  Dry mouth.  Sunken eyes.  Sleepiness.  Weakness. This information is not intended to replace advice given to you by your health care provider. Make sure you discuss any questions you have with your health care provider. Document Released: 05/03/2005 Document Revised: 10/15/2015 Document Reviewed: 01/07/2015 Elsevier Interactive Patient Education  2017 [employer].   Discharge instructions:  Please get your medications reviewed and adjusted by your Primary MD.  Please request your Primary MD to go over all Hospital Tests and Procedure/Radiological results at the follow up, please get all Hospital records sent to your Prim MD by signing hospital release before you go home.  If you had Pneumonia of Lung problems at the Hospital: Please get a 2 view Chest X ray done in 6-8 weeks after hospital discharge or sooner if instructed by your Primary MD.  If you have Congestive Heart Failure: Please call your Cardiologist or Primary MD anytime you have any of the following symptoms:  1) 3 pound weight gain in 24 hours or 5 pounds in 1 week  2) shortness of breath, with or without a dry hacking cough  3) swelling in the hands, feet or stomach  4) if you have to sleep on  extra pillows at night in order to breathe  Follow cardiac low salt diet and 1.5 lit/day fluid restriction.  If you have diabetes Accuchecks 4 times/day, Once in AM empty stomach and then before each meal. Log in all results and show them to your primary doctor at your next visit. If any glucose reading is under 80 or above 300 call your primary MD immediately.  If you have Seizure/Convulsions/Epilepsy: Please do not drive, operate heavy machinery, participate in activities at heights or participate in high speed sports until you have seen by Primary MD or a Neurologist and advised to do so again.  If you had Gastrointestinal Bleeding: Please ask your Primary MD to check a complete blood count within one week of discharge or at your next visit. Your endoscopic/colonoscopic biopsies that are pending at the time of discharge, will also need to followed by your Primary MD.  Get Medicines reviewed and adjusted. Please take all your medications with you for your next visit with your Primary MD  Please request your Primary MD to go over all hospital tests  and procedure/radiological results at the follow up, please ask your Primary MD to get all Hospital records sent to his/her office.  If you experience worsening of your admission symptoms, develop shortness of breath, life threatening emergency, suicidal or homicidal thoughts you must seek medical attention immediately by calling 911 or calling your MD immediately  if symptoms less severe.  You must read complete instructions/literature along with all the possible adverse reactions/side effects for all the Medicines you take and that have been prescribed to you. Take any new Medicines after you have completely understood and accpet all the possible adverse reactions/side effects.   Do not drive or operate heavy machinery when taking Pain medications.   Do not take more than prescribed Pain, Sleep and Anxiety Medications  Special Instructions:  If you have smoked or chewed Tobacco  in the last 2 yrs please stop smoking, stop any regular Alcohol  and or any Recreational drug use.  Wear Seat belts while driving.  Please note You were cared for by a hospitalist during your hospital stay. If you have any questions about your discharge medications or the care you received while you were in the hospital after you are discharged, you can call the unit and asked to speak with the hospitalist on call if the hospitalist that took care of you is not available. Once you are discharged, your primary care physician will handle any further medical issues. Please note that NO REFILLS for any discharge medications will be authorized once you are discharged, as it is imperative that you return to your primary care physician (or establish a relationship with a primary care physician if you do not have one) for your aftercare needs so that they can reassess your need for medications and monitor your lab values.  You can reach the hospitalist office at phone [phone] or fax [phone]   If you do not have a primary care physician, you can call [phone] for a physician referral.

## 2016-08-15 DEATH — deceased

## 2017-06-08 IMAGING — CR DG CHEST 2V
2 series · 2 of 2 positions shown · non-contrast
Comparison: [DATE]/

CLINICAL DATA: Nausea and vomiting.  Shortness of breath.

EXAM:
CHEST  2 VIEW

[w chest lat]
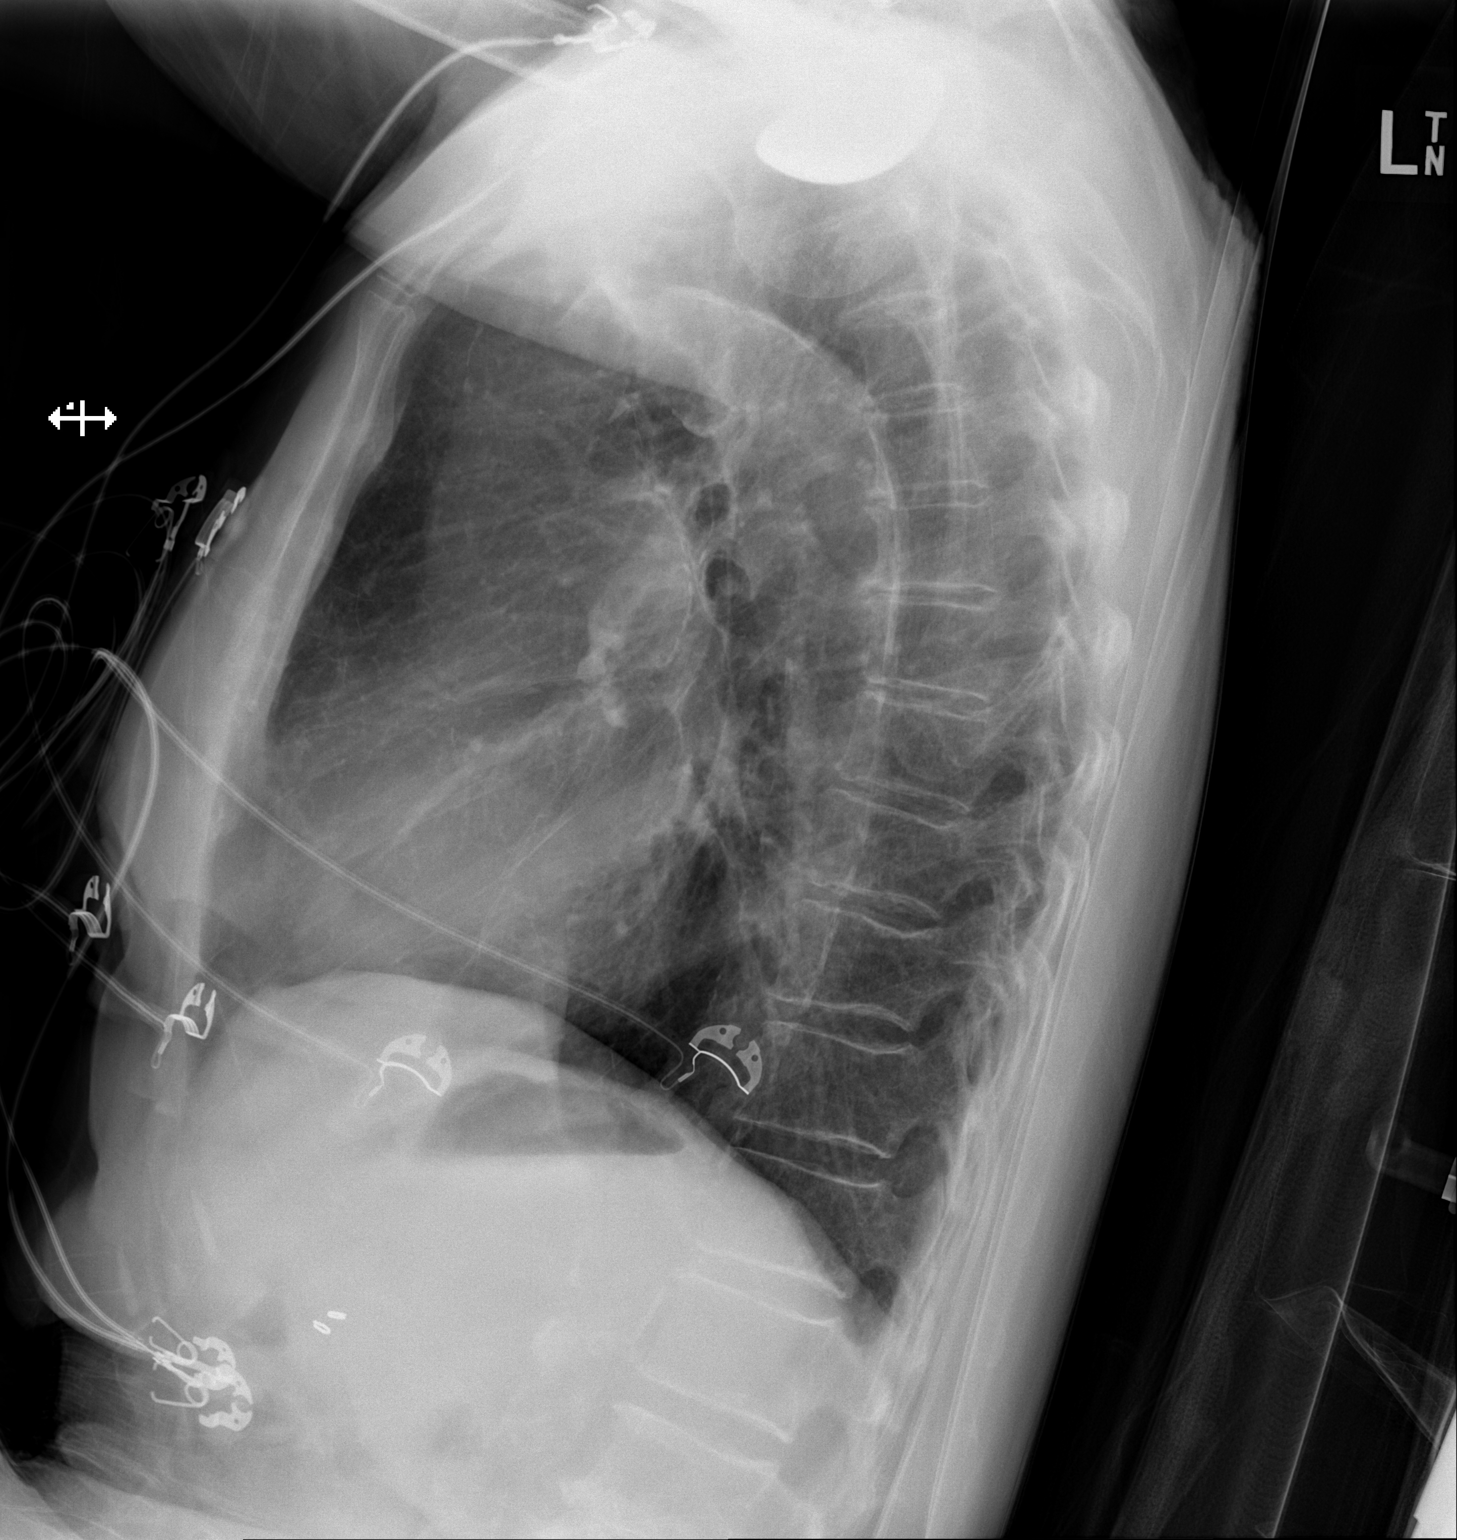

[x chest ap]
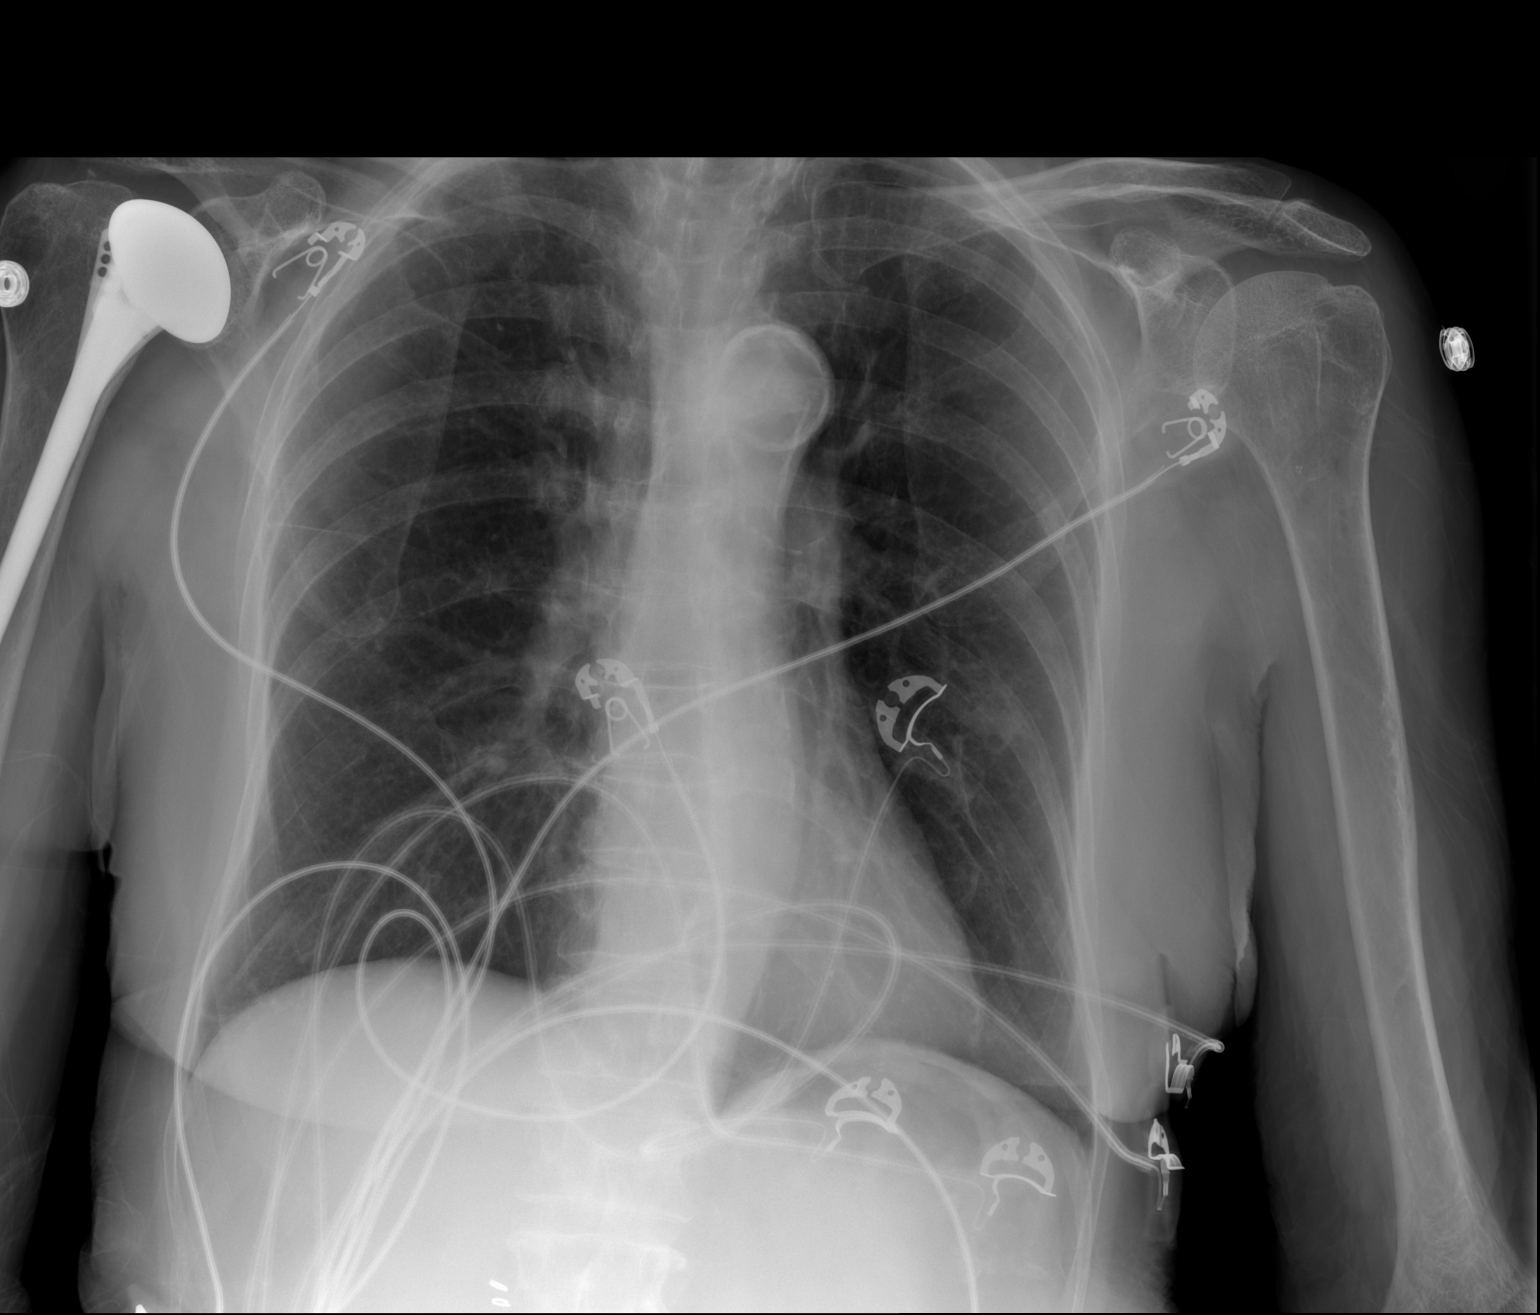

[2 of 2 positions shown; findings below may reference images not displayed]

FINDINGS: The heart size and mediastinal contours are within normal limits.
There is no evidence of pulmonary edema, consolidation,
pneumothorax, nodule or pleural fluid. The visualized skeletal
structures are unremarkable.
IMPRESSION: No active cardiopulmonary disease.

## 2017-06-08 IMAGING — CT CT ABD-PELV W/O CM
2 of 4 series · 16 of 46 positions shown, 18 images · non-contrast
Comparison: 09/23/2015

CLINICAL DATA: Nausea and vomiting for the last 2 days.

EXAM:
CT ABDOMEN AND PELVIS WITHOUT CONTRAST
TECHNIQUE: Multidetector CT imaging of the abdomen and pelvis was performed
following the standard protocol without IV contrast.

[Series 2: abd/pel w/o · axial · non-contrast · 0.74mm/px · z∈[+1114,+1478]mm · 13 of 83 slices shown, 15 images]
[im 5/83  soft-tissue]
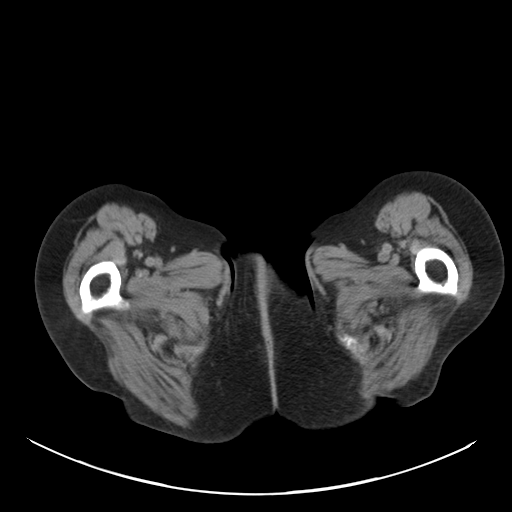
[im 5/83  bone]
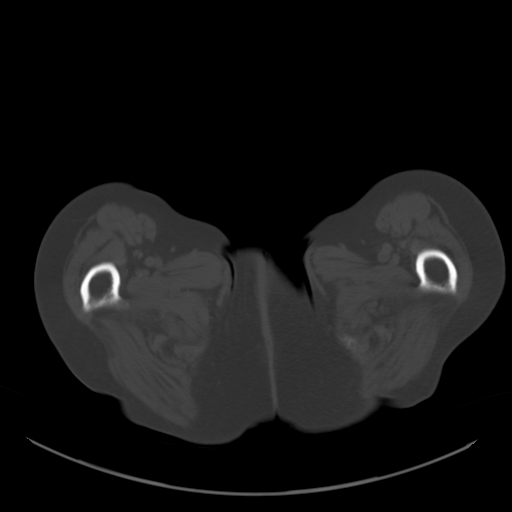
[im 13/83  soft-tissue]
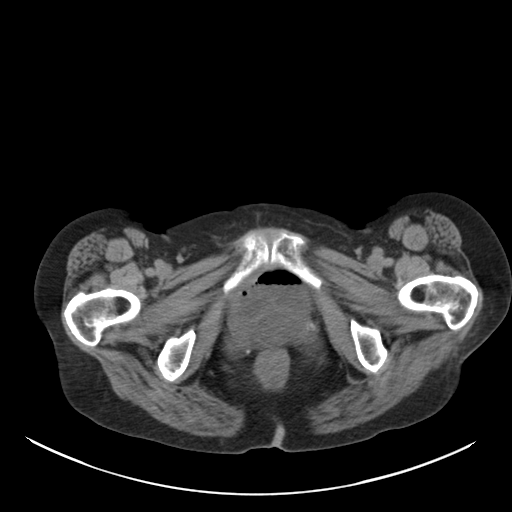
[im 18/83  soft-tissue]
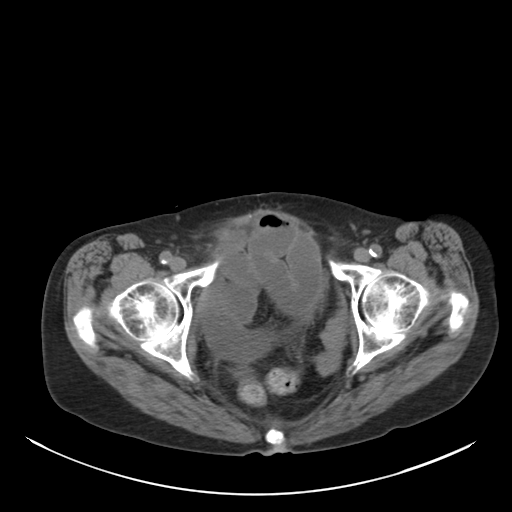
[im 22/83  soft-tissue]
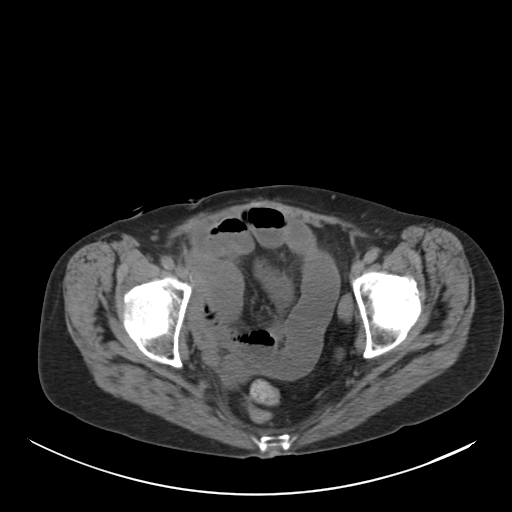
[im 31/83  soft-tissue]
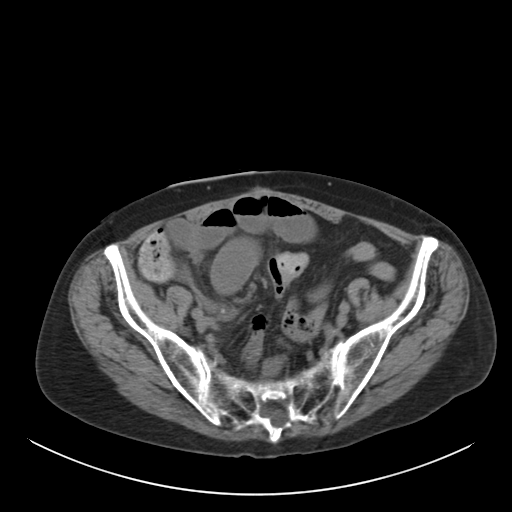
[im 35/83  soft-tissue]
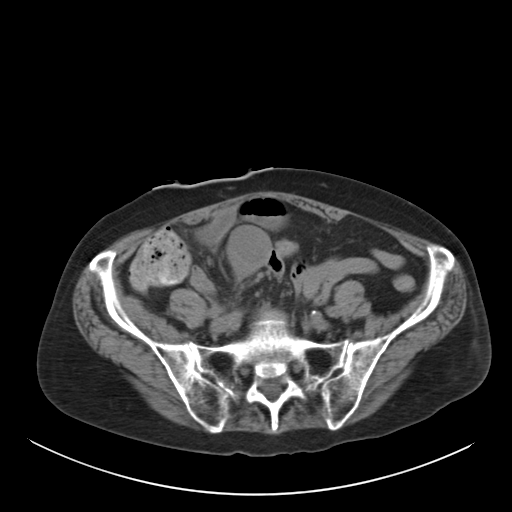
[im 44/83  soft-tissue]
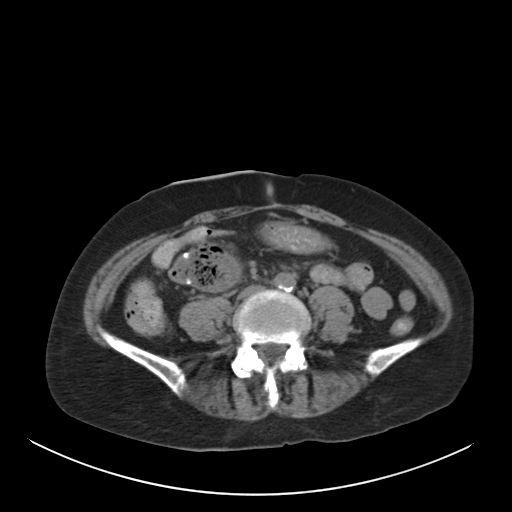
[im 48/83  soft-tissue]
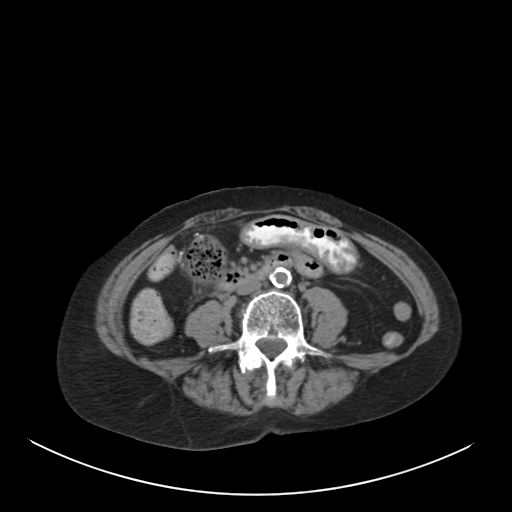
[im 52/83  soft-tissue]
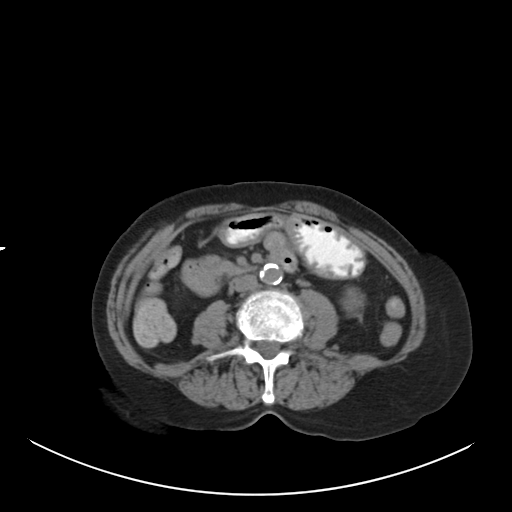
[im 52/83  bone]
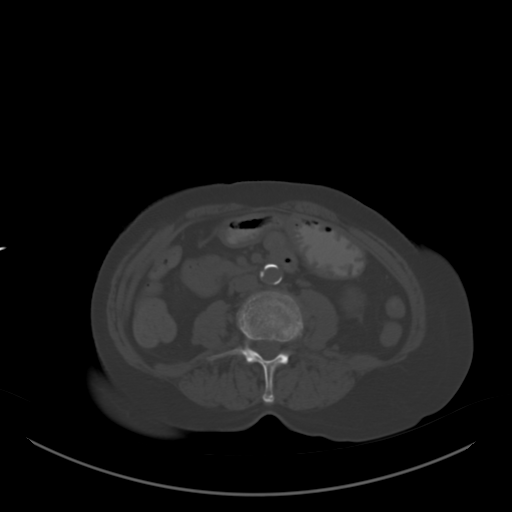
[im 61/83  soft-tissue]
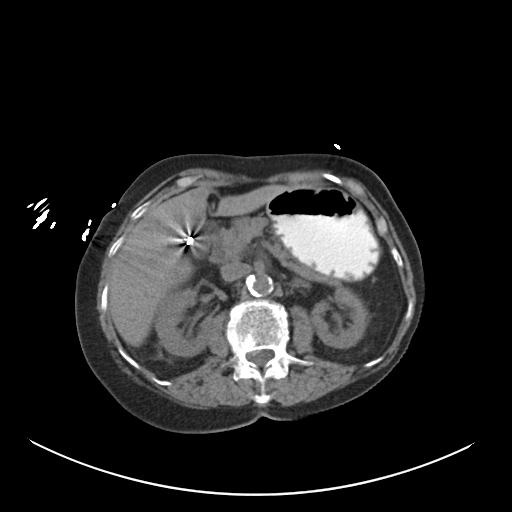
[im 65/83  soft-tissue]
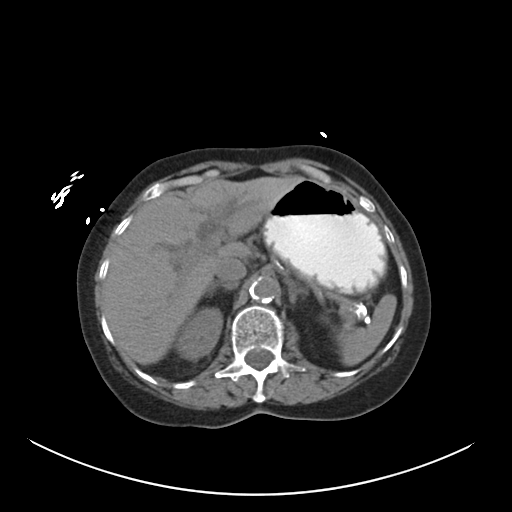
[im 70/83  soft-tissue]
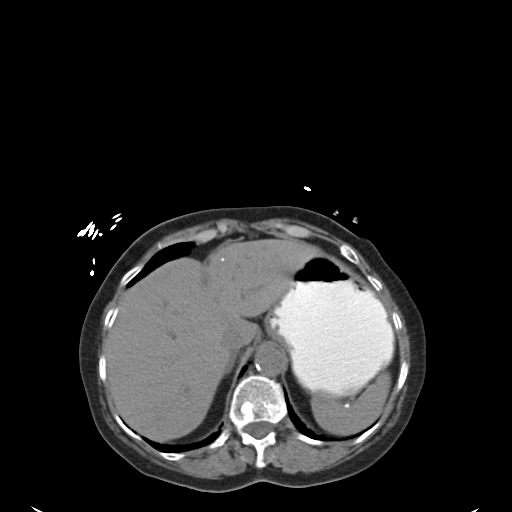
[im 78/83  soft-tissue]
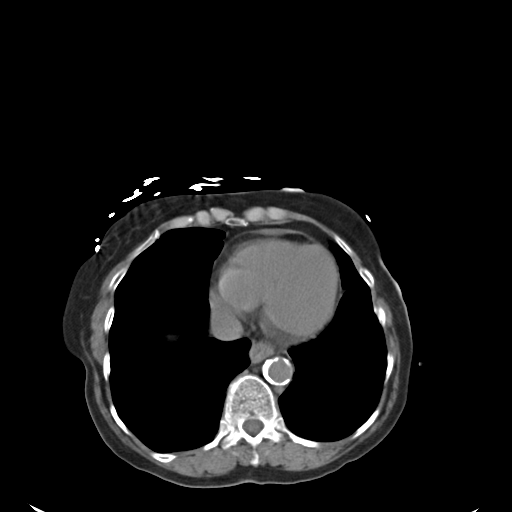

[Series 5: coronal · coronal · 0.65mm/px · 3 of 128 slices shown]
[im 43/128  soft-tissue]
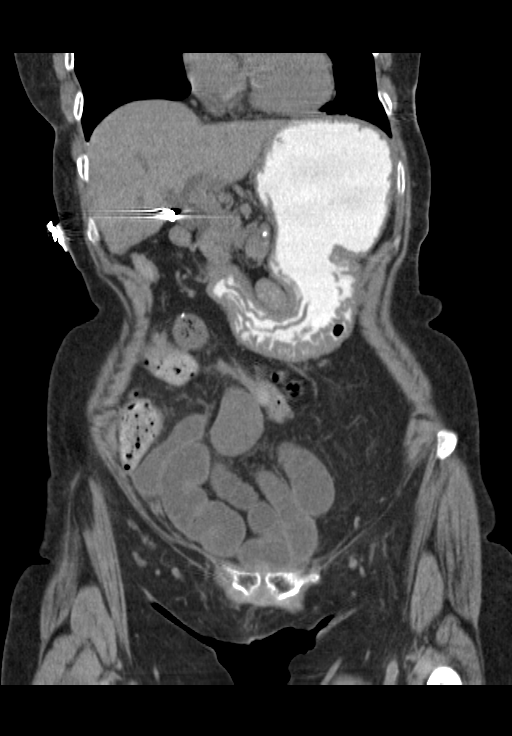
[im 57/128  soft-tissue]
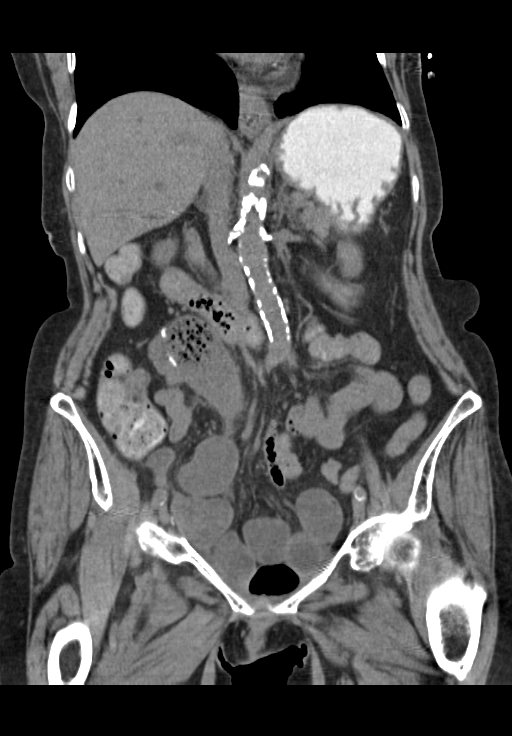
[im 71/128  soft-tissue]
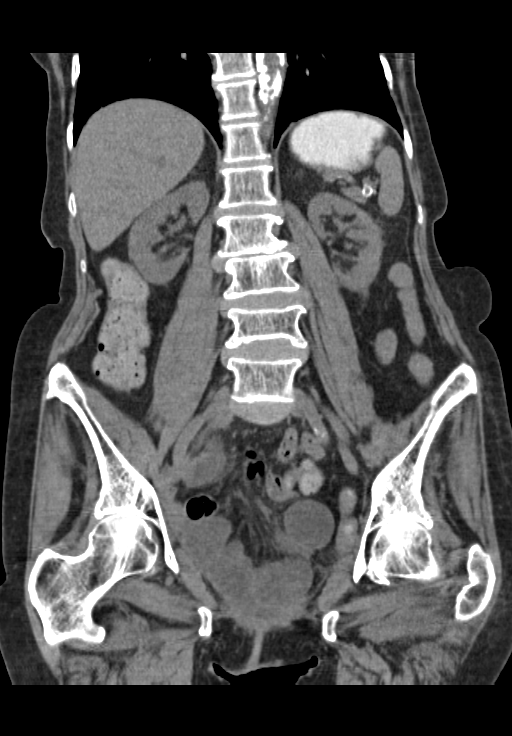

[16 of 46 positions shown; findings below may reference images not displayed]

FINDINGS: Lower chest: The lungs are clear. The heart is mildly enlarged.
There is small pericardial effusion. There is a small hiatal hernia.

Hepatobiliary: Persistent moderate intrahepatic and extrahepatic
biliary dilation, status post cholecystectomy. Scattered hepatic
calcified granulomas.

Pancreas: No mass or inflammatory process identified on this
un-enhanced exam.

Spleen: Within normal limits in size.

Adrenals/Urinary Tract: No evidence of urolithiasis or
hydronephrosis. No definite mass visualized on this un-enhanced
exam. Stable left renal cyst.

Stomach/Bowel: Patient is status post cystectomy with ileal conduit.
There is mild sub pathologic distention of small bowel loops within
the pelvis leading to the conduit, which are fluid-filled.

Vascular/Lymphatic: No pathologically enlarged lymph nodes. No
evidence of abdominal aortic aneurysm.

Reproductive: No mass or other significant abnormality.

Other: None.

Musculoskeletal:  No suspicious bone lesions identified.
IMPRESSION: Status post cystectomy with still appearance of ileal conduit.

Sub pathologic distention of fluid-filled small bowel loops within
the pelvis, may be inflammatory, or it may represent ileus versus
early/ intermittent small bowel obstruction.

Small pericardial effusion.

Small hiatal hernia.

Persistent biliary dilation, status postcholecystectomy.
